# Patient Record
Sex: Male | Born: 1955 | Race: White | Hispanic: No | Smoking: Former smoker
Health system: Southern US, Community
[De-identification: ages and names within clinical notes are randomized; demographics above are authoritative.]

## PROBLEM LIST (undated history)

## (undated) DIAGNOSIS — I10 Essential (primary) hypertension: Secondary | ICD-10-CM

## (undated) DIAGNOSIS — N200 Calculus of kidney: Secondary | ICD-10-CM

## (undated) DIAGNOSIS — Z720 Tobacco use: Secondary | ICD-10-CM

## (undated) DIAGNOSIS — I519 Heart disease, unspecified: Secondary | ICD-10-CM

## (undated) DIAGNOSIS — I7100 Dissection of unspecified site of aorta: Secondary | ICD-10-CM

## (undated) HISTORY — DX: Heart disease, unspecified: I51.9

## (undated) HISTORY — PX: REPAIR OF ACUTE ASCENDING THORACIC AORTIC DISSECTION: SHX6323

## (undated) HISTORY — DX: Dissection of unspecified site of aorta: I71.00

## (undated) HISTORY — DX: Essential (primary) hypertension: I10

## (undated) HISTORY — DX: Calculus of kidney: N20.0

## (undated) HISTORY — DX: Tobacco use: Z72.0

---

## 2005-11-27 ENCOUNTER — Ambulatory Visit: Payer: Self-pay | Admitting: Family Medicine

## 2010-06-10 ENCOUNTER — Inpatient Hospital Stay (HOSPITAL_COMMUNITY): Admission: EM | Admit: 2010-06-10 | Discharge: 2010-06-15 | Payer: Self-pay | Admitting: Emergency Medicine

## 2010-06-10 ENCOUNTER — Ambulatory Visit: Payer: Self-pay | Admitting: Cardiology

## 2010-06-10 ENCOUNTER — Encounter: Payer: Self-pay | Admitting: Cardiothoracic Surgery

## 2010-06-10 ENCOUNTER — Ambulatory Visit: Payer: Self-pay | Admitting: Cardiothoracic Surgery

## 2010-06-10 DIAGNOSIS — I519 Heart disease, unspecified: Secondary | ICD-10-CM

## 2010-06-10 DIAGNOSIS — I7100 Dissection of unspecified site of aorta: Secondary | ICD-10-CM

## 2010-06-10 HISTORY — DX: Heart disease, unspecified: I51.9

## 2010-06-10 HISTORY — DX: Dissection of unspecified site of aorta: I71.00

## 2010-07-11 ENCOUNTER — Encounter: Admission: RE | Admit: 2010-07-11 | Discharge: 2010-07-11 | Payer: Self-pay | Admitting: Cardiothoracic Surgery

## 2010-07-11 ENCOUNTER — Ambulatory Visit: Payer: Self-pay | Admitting: Cardiothoracic Surgery

## 2010-07-11 ENCOUNTER — Encounter: Payer: Self-pay | Admitting: Cardiology

## 2010-07-16 DIAGNOSIS — I7100 Dissection of unspecified site of aorta: Secondary | ICD-10-CM

## 2010-07-16 DIAGNOSIS — F17200 Nicotine dependence, unspecified, uncomplicated: Secondary | ICD-10-CM | POA: Insufficient documentation

## 2010-07-16 DIAGNOSIS — I10 Essential (primary) hypertension: Secondary | ICD-10-CM | POA: Insufficient documentation

## 2010-07-17 ENCOUNTER — Encounter: Payer: Self-pay | Admitting: Cardiology

## 2010-07-17 ENCOUNTER — Ambulatory Visit: Payer: Self-pay | Admitting: Cardiology

## 2010-07-17 DIAGNOSIS — N259 Disorder resulting from impaired renal tubular function, unspecified: Secondary | ICD-10-CM | POA: Insufficient documentation

## 2010-07-22 ENCOUNTER — Encounter: Payer: Self-pay | Admitting: Cardiology

## 2010-07-24 ENCOUNTER — Encounter: Payer: Self-pay | Admitting: Cardiology

## 2010-07-25 ENCOUNTER — Encounter: Payer: Self-pay | Admitting: Cardiology

## 2010-08-01 ENCOUNTER — Telehealth: Payer: Self-pay | Admitting: Cardiology

## 2010-08-06 ENCOUNTER — Encounter: Payer: Self-pay | Admitting: Cardiology

## 2010-08-14 ENCOUNTER — Telehealth: Payer: Self-pay | Admitting: Cardiology

## 2010-08-29 ENCOUNTER — Encounter: Payer: Self-pay | Admitting: Cardiology

## 2010-09-03 ENCOUNTER — Encounter: Payer: Self-pay | Admitting: Cardiology

## 2010-09-10 NOTE — Miscellaneous (Signed)
Summary: Orders Update  Clinical Lists Changes  Medications: Rx of METOPROLOL TARTRATE 25 MG TABS (METOPROLOL TARTRATE) 1 by mouth two times a day;  #90 x 3;  Signed;  Entered by: Charolotte Capuchin, RN;  Authorized by: Rollene Rotunda, MD, [facility];  Method used: Print then Give to Patient Rx of SIMVASTATIN 20 MG TABS (SIMVASTATIN) 1 by mouth daily;  #90 x 3;  Signed;  Entered by: Charolotte Capuchin, RN;  Authorized by: Rollene Rotunda, MD, [facility];  Method used: Print then Give to Patient Orders: Added new Service order of EKG w/ Interpretation (93000) - Signed Observations: Added new observation of PI CARDIO: Your physician recommends that you schedule a follow-up appointment in: 4 months with Dr Antoine Poche in [place] Your physician recommends that you have lab work:  BMP with results faxed to [phone] Your physician recommends that you continue on your current medications as directed. Please refer to the Current Medication list given to you today. (07/17/2010 10:13)    Prescriptions: SIMVASTATIN 20 MG TABS (SIMVASTATIN) 1 by mouth daily  #90 x 3   Entered by:   Charolotte Capuchin, RN   Authorized by:   Rollene Rotunda, MD, [facility]   Signed by:   Charolotte Capuchin, RN on 07/17/2010   Method used:   Print then Give to Patient   RxID:   [id number] METOPROLOL TARTRATE 25 MG TABS (METOPROLOL TARTRATE) 1 by mouth two times a day  #90 x 3   Entered by:   Charolotte Capuchin, RN   Authorized by:   Rollene Rotunda, MD, [facility]   Signed by:   Charolotte Capuchin, RN on 07/17/2010   Method used:   Print then Give to Patient   RxID:   [id number]    Patient Instructions: 1)  Your physician recommends that you schedule a follow-up appointment in: 4 months with Dr Antoine Poche in [place] 2)  Your physician recommends that you have lab work:  BMP with results faxed to [phone] 3)  Your physician recommends that you continue on your current medications as directed. Please refer to the Current  Medication list given to you today.

## 2010-09-10 NOTE — Letter (Signed)
Summary: [facility] - Office Visit  [facility] - Office Visit   Imported By: Marylou Mccoy 07/19/2010 12:49:47  _____________________________________________________________________  External Attachment:    Type:   Image     Comment:   External Document

## 2010-09-10 NOTE — Assessment & Plan Note (Signed)
Summary: [facility]   Visit Type:  Follow-up Primary Provider:  None  CC:  Aortic Dissection.  History of Present Illness: The patient presents for evaluation after aortic dissection and repair. 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 He actually did quite well with this. Since going home he has had no complications and has seen the cardiothoracic surgeons and a followup chest x-ray. He has had no fevers or chills. He has had no chest pain, neck or arm discomfort. He has had no palpitations, presyncope or syncope. He has had no shortness of breath, PND or orthopnea.  Current Medications (verified): 1)  Metoprolol Tartrate 25 Mg Tabs (Metoprolol Tartrate) .Marland Kitchen.. 1 By Mouth Two Times A Day 2)  Aspirin 325 Mg  Tabs (Aspirin) .Marland Kitchen.. 1 By Mouth Daily 3)  Nu-Iron 150 Mg Caps (Polysaccharide Iron Complex) .Marland Kitchen.. 1 By Mouth Daily 4)  Oxycodone Hcl 5 Mg Tabs (Oxycodone Hcl) .... As Needed 5)  Simvastatin 20 Mg Tabs (Simvastatin) .Marland Kitchen.. 1 By Mouth Daily  Allergies (verified): 1)  ! Codeine  Past History:  Past Medical History: Reviewed history from 07/16/2010 and no changes required.  1. 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 Hypertension.   3. Remote tobacco abuse.   4. History of nephrolithiasis, status post stone removal.   5. Reduced left ventricular function by TEE intraoperatively, June 10, 2010 EF of 40-45% without regional wall motion abnormalities.      Past Surgical History: Reviewed history from 07/16/2010 and no changes required.  None.   Review of Systems       As stated in the HPI and negative for all other systems.   Vital Signs:  Patient profile:   55 year old male Height:      69 inches Weight:      178 pounds BMI:     26.38 Pulse rate:   86 / minute Resp:     16 per minute BP sitting:   128 / 82  (right arm)  Vitals  Entered By: Marrion Coy, CNA (July 17, 2010 9:30 AM)  Physical Exam  General:  Well developed, well nourished, in no acute distress. Head:  normocephalic and atraumatic Eyes:  PERRLA/EOM intact; conjunctiva and lids normal. Mouth:  Poor dentitionl. Oral mucosa normal. Neck:  Neck supple, no JVD. No masses, thyromegaly or abnormal cervical nodes. Chest Wall:  Well-healed sternotomy and small right subclavian scar Lungs:  Clear bilaterally to auscultation and percussion. Heart:  Non-displaced PMI, chest non-tender; regular rate and rhythm, S1, S2 without murmurs, rubs or gallops. Carotid upstroke normal, no bruit. Normal abdominal aortic size, no bruits. Femorals normal pulses, no bruits. Pedals normal pulses. No edema, no varicosities. Abdomen:  Bowel sounds positive; abdomen soft and non-tender without masses, organomegaly, or hernias noted. No hepatosplenomegaly. Msk:  Back normal, normal gait. Muscle strength and tone normal. Extremities:  No clubbing or cyanosis. Neurologic:  Alert and oriented x 3. Skin:  Intact without lesions or rashes. Cervical Nodes:  no significant adenopathy Inguinal Nodes:  no significant adenopathy Psych:  Normal affect.   EKG  Procedure date:  07/17/2010  Findings:      Normal sinus rhythm, rate 86, axis within normal limits, interval of the normal limits, poor anterior R wave progression.  Impression & Recommendations:  Problem # 1:  AORTIC DISSECTION (ICD-441.00) He is doing well following this. No change in therapy is indicated.  Problem # 2:  TOBACCO ABUSE (ICD-305.1) He remains on all tobacco and I applauded this and encouraged continued cessation.  Problem # 3:  HYPERTENSION (ICD-401.9) His blood pressure is controlled and he will continue current meds.  Problem # 4:  RENAL INSUFFICIENCY (ICD-588.9) He had some mild renal insufficiency with his creatinine slightly higher at discharge. I will follow his basic metabolic profile.

## 2010-09-12 NOTE — Progress Notes (Signed)
Summary: michelle w/[facility] re meds  RX Toprol XL 50  Phone Note From Other Clinic   Caller: [facility] michelle Summary of Call: michelle calling re clarification of med-pls call [phone] Initial call taken by: Glynda Jaeger,  August 01, 2010 2:20 PM  Follow-up for Phone Call        Toprol XL 50 mg once a day verbal order given again, OK to fill for 1 year Follow-up by: Charolotte Capuchin, RN,  August 01, 2010 3:41 PM

## 2010-09-12 NOTE — Letter (Signed)
Summary: [employer] Employment Production assistant, radio Employment Application   Imported By: Cala Bradford Mesiemore 08/07/2010 10:17:40  _____________________________________________________________________  External Attachment:    Type:   Image     Comment:   External Document  Appended Document: [employer] Employment Application Mailed [employer] papers out to [address],[place] [address]  Appended Document: [employer] Employment Application Correction Mailed to Ace Gins, CPAP Coordinator            [facility]            [place] [address]

## 2010-09-12 NOTE — Progress Notes (Signed)
Summary: b/p issues.   Phone Note Call from Patient Call back at Home Phone [phone]   Caller: Patient Reason for Call: Talk to Nurse Summary of Call: Nicholas Palmer call dr. Lowella Fairy this am was told by him that he would contacted dr. Antoine Poche regarding his b/p issues. Nicholas Palmer aware that dr. Antoine Poche is in the office today having clinic. c/o b/p on last night 147/101 pulse 121 @ 7:30`8:30p.m. Marland Kitchen 153/102 pulse 127 @ 9:00 p.m. Nicholas Palmer took b/p before taking any meds. this was @ 6 am. right arm 162/107 pulse 102  left arm 149/99 pulse 102. took meds.   @ 8a.m. 152/96 pulse 83. @ 10a.m 149/89 pulse 84.   Nicholas Palmer went to [facility] to take his b/p by machine 146/86 pulse 105.  Initial call taken by: Lorne Skeens,  August 14, 2010 3:58 PM  Follow-up for Phone Call        B/P has been elevated for some time now.  Worse in the PM. Nicholas Palmer has been doubling metoprolol 25 mg two times a day for 2 weeks or more and its not getting better. (total of 100mg  a day) Nicholas Palmer aware I will review with MD and call him back with changes. Follow-up by: Charolotte Capuchin, RN,  August 14, 2010 4:16 PM  Additional Follow-up for Phone Call Additional follow up Details #1::        Continue with the higher dose of metoprolol and add Lisinopril 10 mg daily.  Check BMET in two weeks. Additional Follow-up by: Rollene Rotunda, MD, [facility],  August 14, 2010 5:52 PM    Additional Follow-up for Phone Call Additional follow up Details #2::    Nicholas Palmer aware of orders and need to have labs drawn in 2 weeks.   Follow-up by: Charolotte Capuchin, RN,  August 14, 2010 6:08 PM  New/Updated Medications: METOPROLOL TARTRATE 50 MG TABS (METOPROLOL TARTRATE) one twice a day LISINOPRIL 10 MG TABS (LISINOPRIL) one a day Prescriptions: LISINOPRIL 10 MG TABS (LISINOPRIL) one a day  #90 x 3   Entered by:   Charolotte Capuchin, RN   Authorized by:   Rollene Rotunda, MD, [facility]   Signed by:   Charolotte Capuchin, RN on 08/14/2010   Method used:   Electronically to   [facility]  [address] (retail)       [address]       [place], [place]  [address]       Ph: [phone]       Fax: [phone]   RxID:   [id number] METOPROLOL TARTRATE 50 MG TABS (METOPROLOL TARTRATE) one twice a day  #180 x 3   Entered by:   Charolotte Capuchin, RN   Authorized by:   Rollene Rotunda, MD, [facility]   Signed by:   Charolotte Capuchin, RN on 08/14/2010   Method used:   Electronically to        [facility]  [address] (retail)       [address]       [place], [place]  [address]       Ph: [phone]       Fax: [phone]   RxID:   [id number]

## 2010-09-12 NOTE — Medication Information (Signed)
Summary: Toprol  Toprol   Imported By: Marylou Mccoy 08/13/2010 15:23:40  _____________________________________________________________________  External Attachment:    Type:   Image     Comment:   External Document

## 2010-09-12 NOTE — Miscellaneous (Signed)
Summary: metoprolol tart changed to succ.  received faxed from [facility], pt gets medication asst. through their program adn due to cost they need to change his metoprolol tartrate to toprol, ok per Dr Antoine Poche form completed for toprol 50mg  daily, signed by Dr Antoine Poche and faxed to them at [phone] Meredith Staggers, RN  July 25, 2010 8:34 AM  Clinical Lists Changes  Medications: Changed medication from METOPROLOL TARTRATE 25 MG TABS (METOPROLOL TARTRATE) 1 by mouth two times a day to TOPROL XL 50 MG XR24H-TAB (METOPROLOL SUCCINATE) Take 1 tablet by mouth once a day

## 2010-10-22 LAB — BASIC METABOLIC PANEL
BUN: 24 mg/dL — ABNORMAL HIGH (ref 6–23)
BUN: 28 mg/dL — ABNORMAL HIGH (ref 6–23)
BUN: 28 mg/dL — ABNORMAL HIGH (ref 6–23)
BUN: 28 mg/dL — ABNORMAL HIGH (ref 6–23)
BUN: 29 mg/dL — ABNORMAL HIGH (ref 6–23)
BUN: 29 mg/dL — ABNORMAL HIGH (ref 6–23)
CO2: 25 mEq/L (ref 19–32)
CO2: 26 mEq/L (ref 19–32)
CO2: 27 mEq/L (ref 19–32)
CO2: 28 mEq/L (ref 19–32)
CO2: 28 mEq/L (ref 19–32)
CO2: 28 mEq/L (ref 19–32)
Calcium: 7.4 mg/dL — ABNORMAL LOW (ref 8.4–10.5)
Calcium: 7.6 mg/dL — ABNORMAL LOW (ref 8.4–10.5)
Calcium: 7.8 mg/dL — ABNORMAL LOW (ref 8.4–10.5)
Calcium: 7.8 mg/dL — ABNORMAL LOW (ref 8.4–10.5)
Calcium: 8.1 mg/dL — ABNORMAL LOW (ref 8.4–10.5)
Calcium: 8.3 mg/dL — ABNORMAL LOW (ref 8.4–10.5)
Chloride: 106 mEq/L (ref 96–112)
Chloride: 106 mEq/L (ref 96–112)
Chloride: 107 mEq/L (ref 96–112)
Chloride: 107 mEq/L (ref 96–112)
Chloride: 108 mEq/L (ref 96–112)
Chloride: 112 mEq/L (ref 96–112)
Creatinine, Ser: 1.47 mg/dL (ref 0.4–1.5)
Creatinine, Ser: 1.54 mg/dL — ABNORMAL HIGH (ref 0.4–1.5)
Creatinine, Ser: 1.59 mg/dL — ABNORMAL HIGH (ref 0.4–1.5)
Creatinine, Ser: 1.59 mg/dL — ABNORMAL HIGH (ref 0.4–1.5)
Creatinine, Ser: 1.6 mg/dL — ABNORMAL HIGH (ref 0.4–1.5)
Creatinine, Ser: 1.85 mg/dL — ABNORMAL HIGH (ref 0.4–1.5)
GFR calc Af Amer: 46 mL/min — ABNORMAL LOW (ref >60)
GFR calc Af Amer: 55 mL/min — ABNORMAL LOW (ref >60)
GFR calc Af Amer: 55 mL/min — ABNORMAL LOW (ref >60)
GFR calc Af Amer: 55 mL/min — ABNORMAL LOW (ref >60)
GFR calc Af Amer: 57 mL/min — ABNORMAL LOW (ref >60)
GFR calc Af Amer: >60 mL/min (ref >60)
GFR calc non Af Amer: 38 mL/min — ABNORMAL LOW (ref >60)
GFR calc non Af Amer: 45 mL/min — ABNORMAL LOW (ref >60)
GFR calc non Af Amer: 45 mL/min — ABNORMAL LOW (ref >60)
GFR calc non Af Amer: 45 mL/min — ABNORMAL LOW (ref >60)
GFR calc non Af Amer: 47 mL/min — ABNORMAL LOW (ref >60)
GFR calc non Af Amer: 50 mL/min — ABNORMAL LOW (ref >60)
Glucose, Bld: 114 mg/dL — ABNORMAL HIGH (ref 70–99)
Glucose, Bld: 120 mg/dL — ABNORMAL HIGH (ref 70–99)
Glucose, Bld: 127 mg/dL — ABNORMAL HIGH (ref 70–99)
Glucose, Bld: 140 mg/dL — ABNORMAL HIGH (ref 70–99)
Glucose, Bld: 154 mg/dL — ABNORMAL HIGH (ref 70–99)
Glucose, Bld: 77 mg/dL (ref 70–99)
Potassium: 3.9 mEq/L (ref 3.5–5.1)
Potassium: 4.2 mEq/L (ref 3.5–5.1)
Potassium: 4.3 mEq/L (ref 3.5–5.1)
Potassium: 4.4 mEq/L (ref 3.5–5.1)
Potassium: 4.4 mEq/L (ref 3.5–5.1)
Potassium: 4.6 mEq/L (ref 3.5–5.1)
Sodium: 138 mEq/L (ref 135–145)
Sodium: 138 mEq/L (ref 135–145)
Sodium: 139 mEq/L (ref 135–145)
Sodium: 139 mEq/L (ref 135–145)
Sodium: 140 mEq/L (ref 135–145)
Sodium: 143 mEq/L (ref 135–145)

## 2010-10-22 LAB — MAGNESIUM: Magnesium: 2.3 mg/dL (ref 1.5–2.5)

## 2010-10-22 LAB — CBC
HCT: 23.5 % — ABNORMAL LOW (ref 39.0–52.0)
HCT: 24 % — ABNORMAL LOW (ref 39.0–52.0)
HCT: 25 % — ABNORMAL LOW (ref 39.0–52.0)
HCT: 25.4 % — ABNORMAL LOW (ref 39.0–52.0)
HCT: 27 % — ABNORMAL LOW (ref 39.0–52.0)
HCT: 28.6 % — ABNORMAL LOW (ref 39.0–52.0)
Hemoglobin: 7.7 g/dL — ABNORMAL LOW (ref 13.0–17.0)
Hemoglobin: 7.9 g/dL — ABNORMAL LOW (ref 13.0–17.0)
Hemoglobin: 8.3 g/dL — ABNORMAL LOW (ref 13.0–17.0)
Hemoglobin: 8.7 g/dL — ABNORMAL LOW (ref 13.0–17.0)
Hemoglobin: 9.1 g/dL — ABNORMAL LOW (ref 13.0–17.0)
Hemoglobin: 9.3 g/dL — ABNORMAL LOW (ref 13.0–17.0)
MCH: 30.3 pg (ref 26.0–34.0)
MCH: 30.3 pg (ref 26.0–34.0)
MCH: 30.5 pg (ref 26.0–34.0)
MCH: 30.6 pg (ref 26.0–34.0)
MCH: 30.7 pg (ref 26.0–34.0)
MCH: 30.8 pg (ref 26.0–34.0)
MCHC: 32.5 g/dL (ref 30.0–36.0)
MCHC: 32.8 g/dL (ref 30.0–36.0)
MCHC: 32.9 g/dL (ref 30.0–36.0)
MCHC: 33.2 g/dL (ref 30.0–36.0)
MCHC: 33.7 g/dL (ref 30.0–36.0)
MCHC: 34.3 g/dL (ref 30.0–36.0)
MCV: 89.4 fL (ref 78.0–100.0)
MCV: 91.5 fL (ref 78.0–100.0)
MCV: 91.9 fL (ref 78.0–100.0)
MCV: 92.5 fL (ref 78.0–100.0)
MCV: 93.2 fL (ref 78.0–100.0)
MCV: 93.4 fL (ref 78.0–100.0)
Platelets: 131 10*3/uL — ABNORMAL LOW (ref 150–400)
Platelets: 148 10*3/uL — ABNORMAL LOW (ref 150–400)
Platelets: 87 10*3/uL — ABNORMAL LOW (ref 150–400)
Platelets: 89 10*3/uL — ABNORMAL LOW (ref 150–400)
Platelets: 90 10*3/uL — ABNORMAL LOW (ref 150–400)
Platelets: 90 10*3/uL — ABNORMAL LOW (ref 150–400)
RBC: 2.54 MIL/uL — ABNORMAL LOW (ref 4.22–5.81)
RBC: 2.57 MIL/uL — ABNORMAL LOW (ref 4.22–5.81)
RBC: 2.72 MIL/uL — ABNORMAL LOW (ref 4.22–5.81)
RBC: 2.84 MIL/uL — ABNORMAL LOW (ref 4.22–5.81)
RBC: 2.95 MIL/uL — ABNORMAL LOW (ref 4.22–5.81)
RBC: 3.07 MIL/uL — ABNORMAL LOW (ref 4.22–5.81)
RDW: 14.1 % (ref 11.5–15.5)
RDW: 14.1 % (ref 11.5–15.5)
RDW: 14.1 % (ref 11.5–15.5)
RDW: 14.4 % (ref 11.5–15.5)
RDW: 14.5 % (ref 11.5–15.5)
RDW: 14.6 % (ref 11.5–15.5)
WBC: 12.7 10*3/uL — ABNORMAL HIGH (ref 4.0–10.5)
WBC: 13.3 10*3/uL — ABNORMAL HIGH (ref 4.0–10.5)
WBC: 16.4 10*3/uL — ABNORMAL HIGH (ref 4.0–10.5)
WBC: 20.9 10*3/uL — ABNORMAL HIGH (ref 4.0–10.5)
WBC: 21.1 10*3/uL — ABNORMAL HIGH (ref 4.0–10.5)
WBC: 22.9 10*3/uL — ABNORMAL HIGH (ref 4.0–10.5)

## 2010-10-22 LAB — GLUCOSE, CAPILLARY
Glucose-Capillary: 106 mg/dL — ABNORMAL HIGH (ref 70–99)
Glucose-Capillary: 106 mg/dL — ABNORMAL HIGH (ref 70–99)
Glucose-Capillary: 111 mg/dL — ABNORMAL HIGH (ref 70–99)
Glucose-Capillary: 111 mg/dL — ABNORMAL HIGH (ref 70–99)
Glucose-Capillary: 136 mg/dL — ABNORMAL HIGH (ref 70–99)
Glucose-Capillary: 143 mg/dL — ABNORMAL HIGH (ref 70–99)
Glucose-Capillary: 147 mg/dL — ABNORMAL HIGH (ref 70–99)
Glucose-Capillary: 154 mg/dL — ABNORMAL HIGH (ref 70–99)
Glucose-Capillary: 155 mg/dL — ABNORMAL HIGH (ref 70–99)
Glucose-Capillary: 155 mg/dL — ABNORMAL HIGH (ref 70–99)

## 2010-10-22 LAB — POCT I-STAT, CHEM 8
Creatinine, Ser: 1.7 mg/dL — ABNORMAL HIGH (ref 0.4–1.5)
HCT: 27 % — ABNORMAL LOW (ref 39.0–52.0)
Hemoglobin: 9.2 g/dL — ABNORMAL LOW (ref 13.0–17.0)
Potassium: 4.4 mEq/L (ref 3.5–5.1)
Sodium: 141 mEq/L (ref 135–145)

## 2010-10-22 LAB — LIPID PANEL
Cholesterol: 88 mg/dL (ref 0–200)
HDL: 26 mg/dL — ABNORMAL LOW (ref >39)
LDL Cholesterol: 44 mg/dL (ref 0–99)
Total CHOL/HDL Ratio: 3.4 RATIO
Triglycerides: 91 mg/dL (ref <150)
VLDL: 18 mg/dL (ref 0–40)

## 2010-10-22 NOTE — Medication Information (Signed)
Summary: [facility] Prescripsion Request   [facility] Prescripsion Request   Imported By: Roderic Ovens 10/18/2010 11:15:23  _____________________________________________________________________  External Attachment:    Type:   Image     Comment:   External Document

## 2010-10-23 LAB — POCT I-STAT 3, ART BLOOD GAS (G3+)
Acid-base deficit: 3 mmol/L — ABNORMAL HIGH (ref 0.0–2.0)
Acid-base deficit: 4 mmol/L — ABNORMAL HIGH (ref 0.0–2.0)
Bicarbonate: 21.6 mEq/L (ref 20.0–24.0)
Bicarbonate: 22.3 mEq/L (ref 20.0–24.0)
Bicarbonate: 23.8 mEq/L (ref 20.0–24.0)
Bicarbonate: 26.8 mEq/L — ABNORMAL HIGH (ref 20.0–24.0)
O2 Saturation: 100 %
O2 Saturation: 100 %
O2 Saturation: 100 %
O2 Saturation: 97 %
Patient temperature: 36
TCO2: 19 mmol/L (ref 0–100)
TCO2: 21 mmol/L (ref 0–100)
TCO2: 24 mmol/L (ref 0–100)
TCO2: 25 mmol/L (ref 0–100)
TCO2: 26 mmol/L (ref 0–100)
pCO2 arterial: 39.7 mmHg (ref 35.0–45.0)
pCO2 arterial: 40.1 mmHg (ref 35.0–45.0)
pCO2 arterial: 46.2 mmHg — ABNORMAL HIGH (ref 35.0–45.0)
pH, Arterial: 7.233 — ABNORMAL LOW (ref 7.350–7.450)
pH, Arterial: 7.433 (ref 7.350–7.450)
pO2, Arterial: 284 mmHg — ABNORMAL HIGH (ref 80.0–100.0)
pO2, Arterial: 407 mmHg — ABNORMAL HIGH (ref 80.0–100.0)
pO2, Arterial: 97 mmHg (ref 80.0–100.0)

## 2010-10-23 LAB — TYPE AND SCREEN
ABO/RH(D): A POS
Unit division: 0
Unit division: 0
Unit division: 0
Unit division: 0
Unit division: 0

## 2010-10-23 LAB — CBC
HCT: 27.3 % — ABNORMAL LOW (ref 39.0–52.0)
HCT: 27.4 % — ABNORMAL LOW (ref 39.0–52.0)
Hemoglobin: 9.3 g/dL — ABNORMAL LOW (ref 13.0–17.0)
Hemoglobin: 9.4 g/dL — ABNORMAL LOW (ref 13.0–17.0)
MCH: 29.6 pg (ref 26.0–34.0)
MCH: 30.3 pg (ref 26.0–34.0)
MCH: 30.4 pg (ref 26.0–34.0)
MCHC: 34.1 g/dL (ref 30.0–36.0)
MCHC: 34.3 g/dL (ref 30.0–36.0)
MCV: 88.1 fL (ref 78.0–100.0)
MCV: 88.7 fL (ref 78.0–100.0)
MCV: 88.9 fL (ref 78.0–100.0)
Platelets: 159 10*3/uL (ref 150–400)
Platelets: 68 10*3/uL — ABNORMAL LOW (ref 150–400)
Platelets: 80 10*3/uL — ABNORMAL LOW (ref 150–400)
RBC: 2.27 MIL/uL — ABNORMAL LOW (ref 4.22–5.81)
RBC: 3.07 MIL/uL — ABNORMAL LOW (ref 4.22–5.81)
RBC: 3.09 MIL/uL — ABNORMAL LOW (ref 4.22–5.81)
RBC: 4.32 MIL/uL (ref 4.22–5.81)
RDW: 13.5 % (ref 11.5–15.5)
RDW: 13.7 % (ref 11.5–15.5)
WBC: 13.6 10*3/uL — ABNORMAL HIGH (ref 4.0–10.5)
WBC: 14.3 10*3/uL — ABNORMAL HIGH (ref 4.0–10.5)
WBC: 9.8 10*3/uL (ref 4.0–10.5)
WBC: 9.9 10*3/uL (ref 4.0–10.5)

## 2010-10-23 LAB — MAGNESIUM: Magnesium: 2.5 mg/dL (ref 1.5–2.5)

## 2010-10-23 LAB — POCT I-STAT, CHEM 8
BUN: 22 mg/dL (ref 6–23)
Calcium, Ion: 0.99 mmol/L — ABNORMAL LOW (ref 1.12–1.32)
Calcium, Ion: 1.04 mmol/L — ABNORMAL LOW (ref 1.12–1.32)
Chloride: 108 mEq/L (ref 96–112)
Glucose, Bld: 161 mg/dL — ABNORMAL HIGH (ref 70–99)
Glucose, Bld: 201 mg/dL — ABNORMAL HIGH (ref 70–99)
HCT: 39 % (ref 39.0–52.0)
Hemoglobin: 13.3 g/dL (ref 13.0–17.0)
Potassium: 3.3 mEq/L — ABNORMAL LOW (ref 3.5–5.1)

## 2010-10-23 LAB — POCT I-STAT 4, (NA,K, GLUC, HGB,HCT)
Glucose, Bld: 133 mg/dL — ABNORMAL HIGH (ref 70–99)
Glucose, Bld: 175 mg/dL — ABNORMAL HIGH (ref 70–99)
Glucose, Bld: 179 mg/dL — ABNORMAL HIGH (ref 70–99)
Glucose, Bld: 202 mg/dL — ABNORMAL HIGH (ref 70–99)
HCT: 20 % — ABNORMAL LOW (ref 39.0–52.0)
HCT: 31 % — ABNORMAL LOW (ref 39.0–52.0)
HCT: 31 % — ABNORMAL LOW (ref 39.0–52.0)
Hemoglobin: 10.5 g/dL — ABNORMAL LOW (ref 13.0–17.0)
Hemoglobin: 10.5 g/dL — ABNORMAL LOW (ref 13.0–17.0)
Hemoglobin: 8.8 g/dL — ABNORMAL LOW (ref 13.0–17.0)
Hemoglobin: 9.2 g/dL — ABNORMAL LOW (ref 13.0–17.0)
Potassium: 3.6 mEq/L (ref 3.5–5.1)
Potassium: 4.4 mEq/L (ref 3.5–5.1)
Sodium: 145 mEq/L (ref 135–145)
Sodium: 145 mEq/L (ref 135–145)
Sodium: 145 mEq/L (ref 135–145)

## 2010-10-23 LAB — CK TOTAL AND CKMB (NOT AT ARMC)
CK, MB: 3.1 ng/mL (ref 0.3–4.0)
Relative Index: 2.4 (ref 0.0–2.5)
Total CK: 128 U/L (ref 7–232)

## 2010-10-23 LAB — CREATININE, SERUM
Creatinine, Ser: 1.68 mg/dL — ABNORMAL HIGH (ref 0.4–1.5)
GFR calc Af Amer: 52 mL/min — ABNORMAL LOW (ref >60)
GFR calc non Af Amer: 43 mL/min — ABNORMAL LOW (ref >60)

## 2010-10-23 LAB — PROTIME-INR
INR: 0.82 (ref 0.00–1.49)
INR: 1.14 (ref 0.00–1.49)
Prothrombin Time: 11.5 seconds — ABNORMAL LOW (ref 11.6–15.2)
Prothrombin Time: 14.8 seconds (ref 11.6–15.2)

## 2010-10-23 LAB — TROPONIN I: Troponin I: 0.01 ng/mL (ref 0.00–0.06)

## 2010-10-23 LAB — BRAIN NATRIURETIC PEPTIDE: Pro B Natriuretic peptide (BNP): 89 pg/mL (ref 0.0–100.0)

## 2010-10-23 LAB — COMPREHENSIVE METABOLIC PANEL
AST: 18 U/L (ref 0–37)
CO2: 23 mEq/L (ref 19–32)
Calcium: 8.4 mg/dL (ref 8.4–10.5)
Creatinine, Ser: 2.06 mg/dL — ABNORMAL HIGH (ref 0.4–1.5)
GFR calc Af Amer: 41 mL/min — ABNORMAL LOW (ref >60)
GFR calc non Af Amer: 34 mL/min — ABNORMAL LOW (ref >60)
Total Protein: 5.5 g/dL — ABNORMAL LOW (ref 6.0–8.3)

## 2010-10-23 LAB — PREPARE PLATELETS
Unit division: 0
Unit division: 0

## 2010-10-23 LAB — HEMOCCULT GUIAC POC 1CARD (OFFICE): Fecal Occult Bld: NEGATIVE

## 2010-10-23 LAB — POCT I-STAT GLUCOSE
Glucose, Bld: 150 mg/dL — ABNORMAL HIGH (ref 70–99)
Glucose, Bld: 151 mg/dL — ABNORMAL HIGH (ref 70–99)
Glucose, Bld: 190 mg/dL — ABNORMAL HIGH (ref 70–99)
Operator id: 3408
Operator id: 3408

## 2010-10-23 LAB — PREPARE FRESH FROZEN PLASMA: Unit division: 0

## 2010-10-23 LAB — GLUCOSE, CAPILLARY
Glucose-Capillary: 168 mg/dL — ABNORMAL HIGH (ref 70–99)
Glucose-Capillary: 99 mg/dL (ref 70–99)

## 2010-10-23 LAB — PREPARE RBC (CROSSMATCH)

## 2010-10-23 LAB — DIFFERENTIAL
Basophils Absolute: 0 10*3/uL (ref 0.0–0.1)
Basophils Relative: 0 % (ref 0–1)
Eosinophils Absolute: 0 10*3/uL (ref 0.0–0.7)
Eosinophils Relative: 1 % (ref 0–5)
Lymphocytes Relative: 23 % (ref 12–46)
Lymphs Abs: 0.4 10*3/uL — ABNORMAL LOW (ref 0.7–4.0)
Lymphs Abs: 3.2 10*3/uL (ref 0.7–4.0)
Monocytes Absolute: 0.6 10*3/uL (ref 0.1–1.0)
Neutro Abs: 8.8 10*3/uL — ABNORMAL HIGH (ref 1.7–7.7)

## 2010-10-23 LAB — PREPARE CRYOPRECIPITATE: Unit division: 0

## 2010-10-23 LAB — POCT CARDIAC MARKERS
CKMB, poc: <1 ng/mL — ABNORMAL LOW (ref 1.0–8.0)
Myoglobin, poc: 119 ng/mL (ref 12–200)
Troponin i, poc: <0.05 ng/mL (ref 0.00–0.09)
Troponin i, poc: <0.05 ng/mL (ref 0.00–0.09)

## 2010-10-23 LAB — ABO/RH: ABO/RH(D): A POS

## 2010-10-23 LAB — APTT
aPTT: 32 seconds (ref 24–37)
aPTT: 34 seconds (ref 24–37)

## 2010-10-23 LAB — PLATELET COUNT: Platelets: 71 10*3/uL — ABNORMAL LOW (ref 150–400)

## 2010-11-15 ENCOUNTER — Other Ambulatory Visit: Payer: Self-pay | Admitting: Cardiothoracic Surgery

## 2010-12-05 DIAGNOSIS — Z0271 Encounter for disability determination: Secondary | ICD-10-CM

## 2010-12-12 ENCOUNTER — Ambulatory Visit
Admission: RE | Admit: 2010-12-12 | Discharge: 2010-12-12 | Disposition: A | Discharge status: Home / Self Care | Payer: No Typology Code available for payment source | Source: Ambulatory Visit | Attending: Cardiothoracic Surgery | Admitting: Cardiothoracic Surgery

## 2010-12-12 ENCOUNTER — Ambulatory Visit (INDEPENDENT_AMBULATORY_CARE_PROVIDER_SITE_OTHER): Payer: Self-pay | Admitting: Cardiothoracic Surgery

## 2010-12-12 DIAGNOSIS — I7101 Dissection of thoracic aorta: Secondary | ICD-10-CM

## 2010-12-12 MED ORDER — IOHEXOL 350 MG/ML SOLN
100.0000 mL | Freq: Once | INTRAVENOUS | Status: AC | PRN
  Administered 2010-12-12: 100 mL via INTRAVENOUS

## 2010-12-13 NOTE — Assessment & Plan Note (Signed)
OFFICE VISIT  Nicholas Palmer, Nicholas Palmer DOB:  21-Jan-1956                                        Dec 12, 2010 CHART #:  [id number]  The patient returns to the office today in followup after his acute aortic dissection ascending repaired with replacement of the ascending aorta with circulatory arrest on June 10, 2010.  He is now approximately 6 months after his repair.  He does complain of some soreness over the right upper chest were from his arterial cannulation. He notes that he had been unable to return to work and was let go from the [employer] he had been working at.  He is currently seeking disability.  He has had no chest pain or evidence of congestive heart failure.  On exam today, his initial blood pressure is 152/89, on repeat in the right upper arm it was 135/87 and the left upper lung 138/86, pulse with 86, respiratory rate is 20, O2 sats 99%.  His sternum is stable and well healed.  Femoral arterial cannulation site is also well healed.  He has bowel sounds that are crisp, I do not appreciate any murmur of aortic insufficiency.  Abdominal exam is benign.  He has no calf tenderness.  He continues on: 1. Aspirin 325 a day. 2. Lopressor 50 twice a day. 3. Zocor 20 a day. 4. Lisinopril 10 mg a day.  Follow up CT scan was done that shows normal repair of the ascending aorta.  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 There appears no evidence of dilatation of any of these vessels. Obviously, he will continue to need close followup of his aortic status in the future, otherwise, I will plan to see him back in 1 year with a followup CTA of his chest.  He noted that he was to see Dr. Antoine Poche, but was unsure when his appointment was.  He is checking with Dr. Jenene Slicker office for his appointment and followup in [place] in the near future.  Obviously, over  the long run, he will need to continue to be treated aggressively for blood pressure control, ideally with beta- blockers involved.  The patient does not smoke and has been taking his blood pressure at home and appears to be motivated to continue with good health habits.  Sheliah Plane, MD Electronically Signed  EG/MEDQ  D:  12/12/2010  T:  12/13/2010  Job:  [id number]  cc:   Rollene Rotunda, MD, [facility]

## 2010-12-24 NOTE — Assessment & Plan Note (Signed)
OFFICE VISIT   DIERKS, WACH  DOB:  1956-01-19                                        July 11, 2010  CHART #:  [id number]   [name] returns to the office today in followup after his acute  presentation with type 1 aortic dissection on 10 and underwent emergency  repair on June 10, 2010.  He had a reasonable postoperative course  without any significant problems neurologically, was discharged home in  good condition.  He had replacement of his ascending aorta and  resuspension of aortic valve with circulatory arrest.  Since being  discharged home he appears to be increasing his physical activity  appropriately.  He has had no symptoms of congestive heart failure.  No  recurrent chest pain.   On exam, his blood pressure is 129/76, pulse 106, respiratory rate 18,  and O2 sats 98%.  Blood pressure was in his right arm, he has no carotid  bruits.  His sternum is stable and well healed.  Bowel sounds are crisp.  I do not appreciate any murmur of aortic insufficiency.  He has equal  brachial and radial and femoral pulses, and distal pedal pulses.  He has  no pedal edema.   Followup chest x-ray shows clear lung fields without effusions  bilaterally.   He continues on aspirin 325 mg a day, Lopressor 25 b.i.d., oxycodone  p.r.n., and Zocor 20 mg a day.  Overall I am pleased with his progress.  He has a followup appointment with Dr. Antoine Poche, in the early December.  I will plan to see him back in 6 months with a followup CTA of the chest  and abdomen to evaluate the status of his dissection.  The patient does  have issues with work, he works at a [employer] and does significant  amount of heavy lifting.  I have told him that he can do no lifting over  20 pounds after 6-8 weeks off of work but no lifting over that for at  least 3  months.  He is going to check with his work and see if there are other  things he can do that do not involve as much  lifting.   Sheliah Plane, MD  Electronically Signed   EG/MEDQ  D:  07/11/2010  T:  07/12/2010  Job:  [id number]   cc:   Rollene Rotunda, MD, [facility]

## 2011-01-07 ENCOUNTER — Encounter: Payer: Self-pay | Admitting: Cardiology

## 2011-01-08 ENCOUNTER — Encounter: Payer: Self-pay | Admitting: Cardiology

## 2011-01-08 ENCOUNTER — Ambulatory Visit (INDEPENDENT_AMBULATORY_CARE_PROVIDER_SITE_OTHER): Payer: Medicaid Other | Admitting: Cardiology

## 2011-01-08 DIAGNOSIS — N259 Disorder resulting from impaired renal tubular function, unspecified: Secondary | ICD-10-CM

## 2011-01-08 DIAGNOSIS — F17200 Nicotine dependence, unspecified, uncomplicated: Secondary | ICD-10-CM

## 2011-01-08 DIAGNOSIS — I10 Essential (primary) hypertension: Secondary | ICD-10-CM

## 2011-01-08 DIAGNOSIS — I7100 Dissection of unspecified site of aorta: Secondary | ICD-10-CM

## 2011-01-08 MED ORDER — METOPROLOL TARTRATE 50 MG PO TABS: ORAL_TABLET | ORAL | Status: DC

## 2011-01-08 NOTE — Progress Notes (Signed)
HPI The patient presents for followup of hypertension and his aortic dissection. He recently saw his surgeon and I reviewed a CT scan done. It demonstrates a stable thoracic repair with continued true and false lumens filling in the descending aorta thoracic aorta. His blood pressure diary reports that it has been well-controlled. He has had no chest pressure, neck or arm discomfort. He has had no palpitations, syncope or syncope. He has had no weight gain or edema. He is tolerating the medications as listed. He does some walking most days without limitation.  Allergies  Allergen Reactions  . Codeine     Current Outpatient Prescriptions  Medication Sig Dispense Refill  . aspirin 325 MG tablet Take 325 mg by mouth daily.        Marland Kitchen lisinopril (PRINIVIL,ZESTRIL) 10 MG tablet Take 10 mg by mouth daily.        . metoprolol (LOPRESSOR) 50 MG tablet Take 50 mg by mouth 2 (two) times daily.        . simvastatin (ZOCOR) 20 MG tablet Take 20 mg by mouth daily.        Marland Kitchen DISCONTD: iron polysaccharides (NU-IRON) 150 MG capsule Take 150 mg by mouth daily.        Marland Kitchen DISCONTD: oxyCODONE (OXY IR/ROXICODONE) 5 MG immediate release tablet Take 5 mg by mouth as needed.          Past Medical History  Diagnosis Date  . 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 Hypertension   . Tobacco abuse   . Nephrolithiasis     has history; status post stone removal  . Decreased left ventricular function June 10, 2010    EF of 40-45% w/ out regional wall motion abnormalities    ROS:  As stated in the HPI and negative for all other systems.  PHYSICAL EXAM BP 134/78  Pulse 91  Resp 16  Ht 5\' 9"  (1.753 m)  Wt 198 lb (89.812 kg)  BMI 29.24 kg/m2 GENERAL:  Well appearing HEENT:  Pupils equal round and reactive, fundi not visualized, oral mucosa unremarkable NECK:  No jugular venous distention, waveform within normal limits, carotid  upstroke brisk and symmetric, no bruits, no thyromegaly LYMPHATICS:  No cervical, inguinal adenopathy LUNGS:  Clear to auscultation bilaterally BACK:  No CVA tenderness CHEST:  Well healed sternotomy scar and right upper chest pain. HEART:  PMI not displaced or sustained,S1 and S2 within normal limits, no S3, no S4, no clicks, no rubs, no murmurs ABD:  Flat, positive bowel sounds normal in frequency in pitch, no bruits, no rebound, no guarding, no midline pulsatile mass, no hepatomegaly, no splenomegaly EXT:  2 plus pulses throughout, no edema, no cyanosis no clubbing SKIN:  No rashes no nodules NEURO:  Cranial nerves II through XII grossly intact, motor grossly intact throughout PSYCH:  Cognitively intact, oriented to person place and time  EKG:  Sinus rhythm, rate 91, axis within normal limits, intervals within normal limits, no acute ST-T wave changes  ASSESSMENT AND PLAN

## 2011-01-08 NOTE — Assessment & Plan Note (Signed)
I will continue to try to control his blood pressure and heart rate aggressively. Toward that end I will increase his metoprolol to 75 mg twice daily.

## 2011-01-08 NOTE — Patient Instructions (Signed)
Increase metoprolol to 75 mg twice a day Continue all other medications as listed See Dr Antoine Poche back in 6 months in [place]

## 2011-01-08 NOTE — Assessment & Plan Note (Signed)
He had stable renal function on his last labs. No further testing is indicated.

## 2011-01-08 NOTE — Assessment & Plan Note (Signed)
I reviewed with him at length the anatomy from his recent CAT scan. He understands the importance of healthy lifestyle and good blood pressure control.

## 2011-04-17 ENCOUNTER — Other Ambulatory Visit: Payer: Self-pay | Admitting: Cardiology

## 2011-06-27 ENCOUNTER — Encounter: Payer: Self-pay | Admitting: Cardiology

## 2011-07-09 ENCOUNTER — Encounter: Payer: Self-pay | Admitting: Cardiology

## 2011-07-09 ENCOUNTER — Ambulatory Visit (INDEPENDENT_AMBULATORY_CARE_PROVIDER_SITE_OTHER): Payer: Medicaid Other | Admitting: Cardiology

## 2011-07-09 DIAGNOSIS — I10 Essential (primary) hypertension: Secondary | ICD-10-CM

## 2011-07-09 DIAGNOSIS — I7100 Dissection of unspecified site of aorta: Secondary | ICD-10-CM

## 2011-07-09 DIAGNOSIS — N259 Disorder resulting from impaired renal tubular function, unspecified: Secondary | ICD-10-CM

## 2011-07-09 MED ORDER — SIMVASTATIN 20 MG PO TABS: 20.0000 mg | ORAL_TABLET | Freq: Every day | ORAL | Status: DC

## 2011-07-09 MED ORDER — LISINOPRIL 10 MG PO TABS: 10.0000 mg | ORAL_TABLET | Freq: Every day | ORAL | Status: DC

## 2011-07-09 MED ORDER — METOPROLOL TARTRATE 100 MG PO TABS: 100.0000 mg | ORAL_TABLET | Freq: Two times a day (BID) | ORAL | Status: DC

## 2011-07-09 NOTE — Patient Instructions (Addendum)
Please increase your Metoprolol to 100 mg twice a day. Continue all other medications as listed.  Have blood work at [facility]  Follow up with Dr Antoine Poche in 4 months

## 2011-07-09 NOTE — Assessment & Plan Note (Signed)
Lab Results  Component Value Date   CREATININE 1.60* 06/14/2010   He needs a follow up creat.  I do not see one in the primary care chart.

## 2011-07-09 NOTE — Assessment & Plan Note (Signed)
I would like his blood pressure to be a little bit better controlled so I will increase the metoprolol to 100 mg twice a day. He will continue with his blood pressure diary.

## 2011-07-09 NOTE — Assessment & Plan Note (Signed)
He had a CT earlier this year and does have followup with Dr. Lyn Henri.

## 2011-07-09 NOTE — Progress Notes (Signed)
   HPI The patient presents for followup of hypertension and his aortic dissection. CT scan done earlier this year demonstrates a stable thoracic repair with continued true and false lumens filling in the descending aorta thoracic aorta.  He didn't bring his blood pressure diary but he reports that it's in the 130s to 140s systolic and 80s diastolic. He says it does go up with activity. He denies any chest pressure, neck or arm discomfort. He's had no palpitations, presyncope or syncope. He has had no PND or orthopnea. He's had no weight gain or edema.  Allergies  Allergen Reactions  . Codeine     Current Outpatient Prescriptions  Medication Sig Dispense Refill  . aspirin 325 MG tablet Take 325 mg by mouth daily.        Marland Kitchen lisinopril (PRINIVIL,ZESTRIL) 10 MG tablet Take 10 mg by mouth daily.        . metoprolol (LOPRESSOR) 50 MG tablet Please take one and 1/2 tablets in the am and in the pm  270 tablet  3  . simvastatin (ZOCOR) 20 MG tablet TAKE ONE TABLET BY MOUTH ONE TIME DAILY  90 tablet  2    Past Medical History  Diagnosis Date  . 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 Hypertension   . Tobacco abuse   . Nephrolithiasis     has history; status post stone removal  . Decreased left ventricular function June 10, 2010    EF of 40-45% w/ out regional wall motion abnormalities    ROS:  Insomnia.  Otherwise as stated in the HPI and negative for all other systems.  PHYSICAL EXAM BP 127/74  Pulse 78  Ht 5\' 9"  (1.753 m)  Wt 197 lb (89.359 kg)  BMI 29.09 kg/m2 GENERAL:  Well appearing HEENT:  Pupils equal round and reactive, fundi not visualized, oral mucosa unremarkable NECK:  No jugular venous distention, waveform within normal limits, carotid upstroke brisk and symmetric, no bruits, no thyromegaly LYMPHATICS:  No cervical, inguinal adenopathy LUNGS:  Clear to auscultation bilaterally BACK:  No  CVA tenderness CHEST:  Well healed sternotomy scar and right upper chest pain. HEART:  PMI not displaced or sustained,S1 and S2 within normal limits, no S3, no S4, no clicks, no rubs, no murmurs ABD:  Flat, positive bowel sounds normal in frequency in pitch, no bruits, no rebound, no guarding, no midline pulsatile mass, no hepatomegaly, no splenomegaly EXT:  2 plus pulses throughout, no edema, no cyanosis no clubbing SKIN:  No rashes no nodules NEURO:  Cranial nerves II through XII grossly intact, motor grossly intact throughout PSYCH:  Cognitively intact, oriented to person place and time  EKG:  Sinus rhythm, rate 72, axis within normal limits, intervals within normal limits, no acute ST-T wave changes.  07/09/2011    ASSESSMENT AND PLAN

## 2011-10-15 ENCOUNTER — Encounter: Payer: Self-pay | Admitting: Internal Medicine

## 2011-10-22 ENCOUNTER — Ambulatory Visit (INDEPENDENT_AMBULATORY_CARE_PROVIDER_SITE_OTHER): Payer: Medicaid Other | Admitting: Cardiology

## 2011-10-22 ENCOUNTER — Encounter: Payer: Self-pay | Admitting: Cardiology

## 2011-10-22 VITALS — BP 155/80 | HR 77 | Ht 69.0 in | Wt 199.0 lb

## 2011-10-22 DIAGNOSIS — I10 Essential (primary) hypertension: Secondary | ICD-10-CM

## 2011-10-22 DIAGNOSIS — I7100 Dissection of unspecified site of aorta: Secondary | ICD-10-CM

## 2011-10-22 DIAGNOSIS — N259 Disorder resulting from impaired renal tubular function, unspecified: Secondary | ICD-10-CM

## 2011-10-22 MED ORDER — METOPROLOL TARTRATE 25 MG PO TABS: 25.0000 mg | ORAL_TABLET | Freq: Two times a day (BID) | ORAL | Status: DC

## 2011-10-22 NOTE — Patient Instructions (Signed)
Please increase metoprolol by 25 mg one twice a day Continue all other medications as listed.  Follow up in 6 months with Dr Antoine Poche.  You will receive a letter in the mail 2 months before you are due.  Please call us when you receive this letter to schedule your follow up appointment.

## 2011-10-22 NOTE — Assessment & Plan Note (Signed)
I would like his blood pressure to be better controlled. Will increase his metoprolol to 125 mg twice a day. He will continue the other medicines as listed.

## 2011-10-22 NOTE — Assessment & Plan Note (Signed)
His creatinine recently was 1.49. This is followed by his primary provider.

## 2011-10-22 NOTE — Assessment & Plan Note (Signed)
He's had no symptoms related to this. I will discuss with Dr. Tyrone Sage any indication for surveillance CT.

## 2011-10-22 NOTE — Progress Notes (Signed)
   HPI The patient presents for followup of hypertension and his aortic dissection. CT scan done last year demonstrates a stable thoracic repair with continued true and false lumens filling in the descending aorta thoracic aorta.  He reports that it's in the 130s to 140s systolic and 80s diastolic. He denies any chest pressure, neck or arm discomfort. He's had no palpitations, presyncope or syncope. He has had no PND or orthopnea. He's had no weight gain or edema.  He says that he is active walking.  Allergies  Allergen Reactions  . Codeine     Current Outpatient Prescriptions  Medication Sig Dispense Refill  . aspirin 325 MG tablet Take 325 mg by mouth daily.        Marland Kitchen lisinopril (PRINIVIL,ZESTRIL) 10 MG tablet Take 1 tablet (10 mg total) by mouth daily.  30 tablet  11  . metoprolol (LOPRESSOR) 100 MG tablet Take 1 tablet (100 mg total) by mouth 2 (two) times daily.  60 tablet  11  . simvastatin (ZOCOR) 20 MG tablet Take 1 tablet (20 mg total) by mouth at bedtime.  30 tablet  11    Past Medical History  Diagnosis Date  . 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 Hypertension   . Tobacco abuse   . Nephrolithiasis     has history; status post stone removal  . Decreased left ventricular function June 10, 2010    EF of 40-45% w/ out regional wall motion abnormalities    ROS:  Rash on the small of his back and legs.  Otherwise as stated in the HPI and negative for all other systems.  PHYSICAL EXAM BP 155/80  Pulse 77  Ht 5\' 9"  (1.753 m)  Wt 199 lb (90.266 kg)  BMI 29.39 kg/m2 GENERAL:  Well appearing HEENT:  Pupils equal round and reactive, fundi not visualized, oral mucosa unremarkable, poor dentition NECK:  No jugular venous distention, waveform within normal limits, carotid upstroke brisk and symmetric, no bruits, no thyromegaly LYMPHATICS:  No cervical, inguinal adenopathy LUNGS:  Clear to  auscultation bilaterally BACK:  No CVA tenderness CHEST:  Well healed sternotomy scar and right upper chest pain. HEART:  PMI not displaced or sustained,S1 and S2 within normal limits, no S3, no S4, no clicks, no rubs, brief right upper sternal border murmur ABD:  Flat, positive bowel sounds normal in frequency in pitch, no bruits, no rebound, no guarding, no midline pulsatile mass, no hepatomegaly, no splenomegaly EXT:  2 plus pulses throughout, no edema, no cyanosis no clubbing SKIN:  Small focal red raised lesions on the lower back and ankles NEURO:  Cranial nerves II through XII grossly intact, motor grossly intact throughout PSYCH:  Cognitively intact, oriented to person place and time  EKG:  Sinus rhythm, rate 81 axis within normal limits, intervals within normal limits, no acute ST-T wave changes.  10/22/2011  ASSESSMENT AND PLAN

## 2011-11-19 ENCOUNTER — Other Ambulatory Visit: Payer: Self-pay | Admitting: Cardiothoracic Surgery

## 2011-11-19 ENCOUNTER — Other Ambulatory Visit: Payer: Self-pay | Admitting: Thoracic Surgery

## 2011-11-19 DIAGNOSIS — I7101 Dissection of thoracic aorta: Secondary | ICD-10-CM

## 2011-11-20 ENCOUNTER — Other Ambulatory Visit: Payer: Self-pay | Admitting: Cardiothoracic Surgery

## 2011-11-20 DIAGNOSIS — I7101 Dissection of thoracic aorta: Secondary | ICD-10-CM

## 2011-12-18 ENCOUNTER — Other Ambulatory Visit: Payer: Medicaid Other

## 2011-12-18 ENCOUNTER — Ambulatory Visit: Payer: Medicaid Other | Admitting: Cardiothoracic Surgery

## 2011-12-25 ENCOUNTER — Ambulatory Visit: Payer: Medicaid Other | Admitting: Cardiothoracic Surgery

## 2011-12-25 ENCOUNTER — Encounter: Payer: Self-pay | Admitting: Cardiothoracic Surgery

## 2011-12-25 ENCOUNTER — Ambulatory Visit
Admission: RE | Admit: 2011-12-25 | Discharge: 2011-12-25 | Disposition: A | Discharge status: Home / Self Care | Payer: Medicaid Other | Source: Ambulatory Visit | Attending: Cardiothoracic Surgery | Admitting: Cardiothoracic Surgery

## 2011-12-25 ENCOUNTER — Ambulatory Visit (INDEPENDENT_AMBULATORY_CARE_PROVIDER_SITE_OTHER): Payer: Medicaid Other | Admitting: Cardiothoracic Surgery

## 2011-12-25 VITALS — BP 118/71 | HR 70 | Resp 16

## 2011-12-25 DIAGNOSIS — I7101 Dissection of thoracic aorta: Secondary | ICD-10-CM

## 2011-12-25 DIAGNOSIS — Z8679 Personal history of other diseases of the circulatory system: Secondary | ICD-10-CM

## 2011-12-25 MED ORDER — IOHEXOL 350 MG/ML SOLN
100.0000 mL | Freq: Once | INTRAVENOUS | Status: AC | PRN
  Administered 2011-12-25: 100 mL via INTRAVENOUS

## 2011-12-25 NOTE — Progress Notes (Signed)
[address]            [place] [address]          [phone]      JIHAN RUDY [facility] Medical Record #[id number] Date of Birth: 06/15/1956  Referring: Joya Gaskins, MD Primary Care: Rudi Heap, MD, MD  Chief Complaint:    Chief Complaint  Patient presents with  . Routine Post Op    for hx of aortic dissection , s/p surgery 06/2010.Marland KitchenMarland KitchenCTA CHEST/ABD    History of Present Illness:    Patient returns today for followup visit after repair of a descending aortic dissection and resuspension of the aortic valve in November of 2011. He continues to see cardiology for control of his blood pressure      Current Activity/ Functional Status: Patient is independent with mobility/ambulation, transfers, ADL's, IADL's.   Past Medical History  Diagnosis Date  . 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 Hypertension   . Tobacco abuse   . Nephrolithiasis     has history; status post stone removal  . Decreased left ventricular function June 10, 2010    EF of 40-45% w/ out regional wall motion abnormalities    History reviewed. No pertinent past surgical history.  Family History  Problem Relation Age of Onset  . Lung disease Mother   . Microcephaly Father   . Microcephaly Brother     History   Social History  . Marital Status: Legally Separated    Spouse Name: N/A    Number of Children: N/A  . Years of Education: N/A   Occupational History  . Not on file.   Social History Main Topics  . Smoking status: Former Smoker    Quit date: 08/11/2002  . Smokeless tobacco: Not on file  . Alcohol Use: Not on file  . Drug Use: Not on file  . Sexually Active: Not on file   Other Topics Concern  . Not on file   Social History Narrative   Has 20-pack-year history of tobacco abuse, quitting many years ago. Denies alcohol or drugs. Does not routinely  exercise     History  Smoking status  . Former Smoker  . Quit date: 08/11/2002  Smokeless tobacco  . Not on file    History  Alcohol Use: Not on file     Allergies  Allergen Reactions  . Codeine     Current Outpatient Prescriptions  Medication Sig Dispense Refill  . aspirin 325 MG tablet Take 325 mg by mouth daily.        . B Complex-C (B-COMPLEX WITH VITAMIN C) tablet Take 1 tablet by mouth daily.      . Iron 66 MG TABS Take 45 mg by mouth.      Marland Kitchen lisinopril (PRINIVIL,ZESTRIL) 10 MG tablet Take 1 tablet (10 mg total) by mouth daily.  30 tablet  11  . metoprolol (LOPRESSOR) 100 MG tablet Take 1 tablet (100 mg total) by mouth 2 (two) times daily.  60 tablet  11  . metoprolol tartrate (LOPRESSOR) 25 MG tablet Take 1 tablet (25 mg total) by mouth 2 (two) times daily.  60 tablet  11  . simvastatin (ZOCOR) 20 MG tablet Take 1 tablet (20 mg total) by mouth at bedtime.  30 tablet  11  No current facility-administered medications for this visit.   Facility-Administered Medications Ordered in Other Visits  Medication Dose Route Frequency Provider Last Rate Last Dose  . iohexol (OMNIPAQUE) 350 MG/ML injection 100 mL  100 mL Intravenous Once PRN Medication Radiologist, MD   100 mL at 12/25/11 1439       Review of Systems:     Cardiac Review of Systems: Y or N  Chest Pain [  n ]  Resting SOB [n] Exertional SOB  [n  ]  Orthopnea [n]   Pedal Edema [ n  ]    Palpitations [n  ] Syncope  [  n]   Presyncope [ n ]  General Review of Systems: [Y] = yes [  ]=no Constitional: recent weight change [  ]; anorexia [  ]; fatigue [  ]; nausea [  ]; night sweats [  ]; fever [  ]; or chills [  ];                                                                                                                                          Dental: poor dentition[ y ];  Eye : blurred vision [  ]; diplopia [   ]; vision changes [  ];  Amaurosis fugax[  ]; Resp: cough [  ];  wheezing[  ];  hemoptysis[  ];  shortness of breath[  ]; paroxysmal nocturnal dyspnea[  ]; dyspnea on exertion[  ]; or orthopnea[  ];  GI:  gallstones[  ], vomiting[  ];  dysphagia[  ]; melena[  ];  hematochezia [  ]; heartburn[  ];   Hx of  Colonoscopy[  ]; GU: kidney stones [  ]; hematuria[  ];   dysuria [  ];  nocturia[  ];  history of     obstruction [  ];             Skin: rash, swelling[  ];, hair loss[  ];  peripheral edema[  ];  or itching[  ]; Musculosketetal: myalgias[  ];  joint swelling[  ];  joint erythema[  ];  joint pain[  ];  back pain[  ];  Heme/Lymph: bruising[  ];  bleeding[  ];  anemia[  ];  Neuro: TIA[  ];  headaches[  ];  stroke[  ];  vertigo[  ];  seizures[  ];   paresthesias[  ];  difficulty walking[  ];  Psych:depression[  ]; anxiety[  ];  Endocrine: diabetes[  ];  thyroid dysfunction[  ];  Immunizations: Flu [?  ]; Pneumococcal[?  ];  Other:  Physical Exam: BP 118/71  Pulse 70  Resp 16  SpO2 95%  General appearance: alert, cooperative, appears stated age and no distress Neurologic: intact Heart: regular rate and rhythm, S1, S2 normal, no murmur, click, rub or gallop and normal apical impulse Lungs: clear to auscultation bilaterally and normal percussion  bilaterally Abdomen: soft, non-tender; bowel sounds normal; no masses,  no organomegaly Extremities: extremities normal, atraumatic, no cyanosis or edema and Homans sign is negative, no sign of DVT   Diagnostic Studies & Laboratory data:     Recent Radiology Findings:   Ct Angio Chest W/cm &/or Wo Cm  12/25/2011  *RADIOLOGY REPORT*  Clinical Data:  Thoracic aortic dissection, post ascending aortic repair (2012).  No current complaints.  CT ANGIOGRAPHY CHEST, ABDOMEN AND PELVIS  Technique:  Multidetector CT imaging through the chest, abdomen and pelvis was performed using the standard protocol during bolus administration of intravenous contrast.  Multiplanar reconstructed images including MIPs were obtained and reviewed to evaluate the  vascular anatomy.  Contrast: OMNIPAQUE IOHEXOL 350 MG/ML SOLN,  Comparison:  Dissection protocol CT of chest abdomen pelvis - 12/12/2010; chest CT - 06/10/2010  CTA CHEST  Vascular Findings:  Unchanged appearance of ascending aortic thoracic aortic repair. Review of noncontrast images are negative for intramural hematoma formation.  The ascending thoracic aorta is unchanged in size measuring approximately 3.5 x 3.5 cm (as measured at the level of the main pulmonary artery (image 56, series 5).  The external diameter of the posterior aspect of the aortic arch has minimally increased in size, measuring approximately 3.8 x 4 cm in greatest coronal dimension (image 62, series 601) previously, 3.6 x 3.7 cm.  The proximal aspect of the descending thoracic aorta has also minimally increased in size, now measuring approximately 3.3 x 3.4 cm (image 56, series five) previously, 3.1 x 3.3 cm.  The distal aspect of the descending thoracic aorta is unchanged in size measuring approximately 2.8 x 3.0 cm as measured at the level of the diaphragmatic hiatus (image 105, series five).  There is persistent findings of a type A thoracic aortic dissection which extends to involve the innominate artery to the level of the vessel's bifurcation into the subclavian and common carotid artery. There is no definite extension of the dissection past the innominate artery bifurcation.  The visualized portions of the cervical vasculature are patent.  The dissection flap extends throughout the entirety of the thoracic aorta.  Review of the precontrast images are negative for intramural hematoma formation. No periaortic stranding.  Review of the MIP images confirms the above findings.  Nonvascular findings:  Indeterminate noncalcified approximately 4 mm subpleural nodule in the right upper lobe (image 13) is grossly unchanged compared to most remote chest CT. No new pulmonary nodules.  Mild centrilobular emphysematous change.  Minimal  bibasilar dependent atelectasis, left greater than right.  No focal airspace opacities.  No pleural effusion.  No pneumothorax.  Central airways are patent.  Shoddy mediastinal lymph nodes are grossly unchanged with an index previous tracheal carinal lymph node measuring 7 mm in short axis diameter.  Decreased postoperative stranding within the anterior mediastinum.  Grossly symmetric axillary lymph nodes are grossly unchanged several which contain benign fatty hila presumably reactive in etiology.  No acute or aggressive osseous abnormalities.  Post median sternotomy.  IMPRESSION: 1.  Stable sequela of ascending aortic repair with unchanged caliber of the ascending thoracic aorta. 2.  Minimal enlargement of the posterior aspect of the aortic arch and proximal aspect of the descending thoracic aorta as above. 3.  Persistent findings of a Type A thoracic aortic dissection with extension to involve the innominate artery.  4. Technically indeterminate 4 mm subpleural nodule within the right upper lobe has been stable since the 05/2010 examination. Continued attention on follow-up is recommended.  CTA  ABDOMEN AND PELVIS  Vascular Findings:  There is grossly unchanged extension of the thoracic aortic dissection throughout the entirety of the abdominal aorta.  The dissection terminates regional to the bilateral common iliac artery bifurcations with short extension into the right external and the left internal iliac arteries.  The presumed false lumen again supplies the celiac, SMA IMA and both duplicated left renal arteries.  The dissection flap again extends to involve a short segment of the proximal aspect of the right renal artery.  The dissection extends to involve the origin of the dominant left sided duplicated renal artery.  The bilateral kidneys are symmetric in size and there is no evidence of renal infarction.  Normal caliber of the abdominal aorta.  Incidental note is made of a retroaortic left renal vein.   There is unchanged mild fusiform dilatation of the bilateral common iliac arteries, each measuring approximately 1.7 x 1.5 cm (image 171, series five).  Review of the MIP images confirms the above findings.  Nonvascular findings:  Evaluation of the abdominal organs is limited to the arterial phase of enhancement.  Normal hepatic contour.  Unchanged cyst within the left lobe of the liver.  Sub centimeter lesion within the right lobe the liver is unchanged but favored to represent an additional hepatic cyst but remains too small to accurately characterize.   No ascites.  Normal gallbladder.  No intra or extrahepatic biliary duct dilatation.  There is symmetric enhancement the bilateral kidneys.  Unchanged left-sided renal cysts.  No urinary obstruction.  No discrete renal stones on the post contrast examination.  Normal early arterial phase evaluation of the bilateral adrenal glands and pancreas.  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  No peripancreatic stranding.  No pancreatic ductal dilatation or atrophy.  Scattered colonic diverticulosis without evidence of diverticulitis.  The bowel is otherwise normal in course and caliber without wall thickening or evidence of obstruction.  Normal appearance of the appendix.  No pneumoperitoneum, pneumatosis or portal venous gas.  No retroperitoneal, mesenteric or pelvic lymphadenopathy.  Symmetric bilateral inguinal lymph nodes are presumably reactive in etiology.  Pelvic organs are normal.  No free fluid in the pelvis.  No acute or aggressive osseous abnormalities within the abdomen or pelvis.  IMPRESSION:  1.  Persistent extension of the thoracic aortic dissection throughout the entirety of the abdominal aorta with persistent extension of the dissection to involve the proximal aspect of the right renal artery.  No evidence of end organ ischemia.  2.  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  3. Colonic diverticulosis without evidence of diverticulitis.  Original Report Authenticated By: Waynard Reeds, M.D.   Ct Angio Abdomen W/cm &/or Wo Contrast  12/25/2011  *RADIOLOGY REPORT*  Clinical Data:  Thoracic aortic dissection, post ascending aortic repair (2012).  No current complaints.  CT ANGIOGRAPHY CHEST, ABDOMEN AND PELVIS  Technique:  Multidetector CT imaging through the chest, abdomen and pelvis was performed using the standard protocol during bolus administration of intravenous contrast.  Multiplanar reconstructed images including MIPs were obtained and reviewed to evaluate the vascular anatomy.  Contrast: OMNIPAQUE IOHEXOL 350 MG/ML SOLN,  Comparison:  Dissection protocol CT of chest abdomen pelvis - 12/12/2010; chest CT - 06/10/2010  CTA CHEST  Vascular Findings:  Unchanged appearance of ascending aortic thoracic aortic repair. Review of noncontrast images are negative for intramural hematoma formation.  The ascending thoracic  aorta is unchanged in size measuring approximately 3.5 x 3.5 cm (as measured at the level of the main pulmonary artery (image 56, series 5).  The external diameter of the posterior aspect of the aortic arch has minimally increased in size, measuring approximately 3.8 x 4 cm in greatest coronal dimension (image 62, series 601) previously, 3.6 x 3.7 cm.  The proximal aspect of the descending thoracic aorta has also minimally increased in size, now measuring approximately 3.3 x 3.4 cm (image 56, series five) previously, 3.1 x 3.3 cm.  The distal aspect of the descending thoracic aorta is unchanged in size measuring approximately 2.8 x 3.0 cm as measured at the level of the diaphragmatic hiatus (image 105, series five).  There is persistent findings of a type A thoracic aortic dissection which extends to involve the innominate artery to the level of the vessel's bifurcation into the subclavian and  common carotid artery. There is no definite extension of the dissection past the innominate artery bifurcation.  The visualized portions of the cervical vasculature are patent.  The dissection flap extends throughout the entirety of the thoracic aorta.  Review of the precontrast images are negative for intramural hematoma formation. No periaortic stranding.  Review of the MIP images confirms the above findings.  Nonvascular findings:  Indeterminate noncalcified approximately 4 mm subpleural nodule in the right upper lobe (image 13) is grossly unchanged compared to most remote chest CT. No new pulmonary nodules.  Mild centrilobular emphysematous change.  Minimal bibasilar dependent atelectasis, left greater than right.  No focal airspace opacities.  No pleural effusion.  No pneumothorax.  Central airways are patent.  Shoddy mediastinal lymph nodes are grossly unchanged with an index previous tracheal carinal lymph node measuring 7 mm in short axis diameter.  Decreased postoperative stranding within the anterior mediastinum.  Grossly symmetric axillary lymph nodes are grossly unchanged several which contain benign fatty hila presumably reactive in etiology.  No acute or aggressive osseous abnormalities.  Post median sternotomy.  IMPRESSION: 1.  Stable sequela of ascending aortic repair with unchanged caliber of the ascending thoracic aorta. 2.  Minimal enlargement of the posterior aspect of the aortic arch and proximal aspect of the descending thoracic aorta as above. 3.  Persistent findings of a Type A thoracic aortic dissection with extension to involve the innominate artery.  4. Technically indeterminate 4 mm subpleural nodule within the right upper lobe has been stable since the 05/2010 examination. Continued attention on follow-up is recommended.  CTA ABDOMEN AND PELVIS  Vascular Findings:  There is grossly unchanged extension of the thoracic aortic dissection throughout the entirety of the abdominal aorta.  The  dissection terminates regional to the bilateral common iliac artery bifurcations with short extension into the right external and the left internal iliac arteries.  The presumed false lumen again supplies the celiac, SMA IMA and both duplicated left renal arteries.  The dissection flap again extends to involve a short segment of the proximal aspect of the right renal artery.  The dissection extends to involve the origin of the dominant left sided duplicated renal artery.  The bilateral kidneys are symmetric in size and there is no evidence of renal infarction.  Normal caliber of the abdominal aorta.  Incidental note is made of a retroaortic left renal vein.  There is unchanged mild fusiform dilatation of the bilateral common iliac arteries, each measuring approximately 1.7 x 1.5 cm (image 171, series five).  Review of the MIP images confirms the above findings.  Nonvascular  findings:  Evaluation of the abdominal organs is limited to the arterial phase of enhancement.  Normal hepatic contour.  Unchanged cyst within the left lobe of the liver.  Sub centimeter lesion within the right lobe the liver is unchanged but favored to represent an additional hepatic cyst but remains too small to accurately characterize.   No ascites.  Normal gallbladder.  No intra or extrahepatic biliary duct dilatation.  There is symmetric enhancement the bilateral kidneys.  Unchanged left-sided renal cysts.  No urinary obstruction.  No discrete renal stones on the post contrast examination.  Normal early arterial phase evaluation of the bilateral adrenal glands and pancreas.  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  No peripancreatic stranding.  No pancreatic ductal dilatation or atrophy.  Scattered colonic diverticulosis without evidence of diverticulitis.  The bowel is otherwise normal in course and caliber without  wall thickening or evidence of obstruction.  Normal appearance of the appendix.  No pneumoperitoneum, pneumatosis or portal venous gas.  No retroperitoneal, mesenteric or pelvic lymphadenopathy.  Symmetric bilateral inguinal lymph nodes are presumably reactive in etiology.  Pelvic organs are normal.  No free fluid in the pelvis.  No acute or aggressive osseous abnormalities within the abdomen or pelvis.  IMPRESSION:  1.  Persistent extension of the thoracic aortic dissection throughout the entirety of the abdominal aorta with persistent extension of the dissection to involve the proximal aspect of the right renal artery.  No evidence of end organ ischemia.  2. 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  3. Colonic diverticulosis without evidence of diverticulitis.  Original Report Authenticated By: Waynard Reeds, M.D.      Recent Lab Findings: Lab Results  Component Value Date   WBC 13.3* 06/14/2010   HGB 9.3* 06/14/2010   HCT 28.6* 06/14/2010   PLT 148* 06/14/2010   GLUCOSE 114* 06/14/2010   CHOL  Value: 88 (NOTE) ATP III Classification:      < 200        mg/dL        Desirable     213 - 239     mg/dL        Borderline High     >= 240        mg/dL        High  03/17/5783   TRIG 91 06/14/2010   HDL 26* 06/14/2010   LDLCALC  Value: 44 (NOTE)  Total Cholesterol/HDL Ratio:CHD Risk                       Coronary Heart Disease Risk Table                                       Men       Women         1/2 Average Risk              3.4        3.3             Average Risk              5.0         4.4         2 X Average Risk  9.6        7.1         3 X Average Risk             23.4       11.0 Use the calculated Patient Ratio above and the CHD Risk table  to determine the patient's CHD Risk. ATP III Classification (LDL):      < 100         mg/dL         Optimal     161 - 129     mg/dL         Near or Above Optimal     130 - 159     mg/dL         Borderline High     160 - 189      mg/dL         High      > 096        mg/dL         Very High  11/13/4096   ALT 18 06/09/2010   AST 18 06/09/2010   NA 139 06/14/2010   K 4.2 06/14/2010   CL 107 06/14/2010   CREATININE 1.60* 06/14/2010   BUN 29* 06/14/2010   CO2 26 06/14/2010   INR 0.82 06/10/2010      Assessment / Plan:     Patient returns today in followup after repair of a descending aorta and resuspension of his aortic valve for acute aortic dissection in 2011. He has a persistent false lumen and the arch and descending thoracic aorta, followup CT scans shows stable situation compared to previous scans. I stressed to him the importance to continue with good blood pressure control, including beta blocker which he appears to be doing. We'll plan to see him back in one year with a repeat CTA of the chest.       Delight Ovens MD  Beeper [phone] Office [phone] 12/25/2011 5:12 PM

## 2012-05-10 ENCOUNTER — Other Ambulatory Visit: Payer: Self-pay | Admitting: Cardiology

## 2012-05-10 MED ORDER — SIMVASTATIN 20 MG PO TABS: 20.0000 mg | ORAL_TABLET | Freq: Every day | ORAL | Status: DC

## 2012-05-10 NOTE — Telephone Encounter (Signed)
Pt needs a refill asap and Pharm is confirmed

## 2012-07-05 ENCOUNTER — Other Ambulatory Visit: Payer: Self-pay | Admitting: Cardiology

## 2012-10-04 ENCOUNTER — Other Ambulatory Visit: Payer: Self-pay | Admitting: Cardiology

## 2012-10-04 DIAGNOSIS — I10 Essential (primary) hypertension: Secondary | ICD-10-CM

## 2012-10-04 MED ORDER — METOPROLOL TARTRATE 25 MG PO TABS: 25.0000 mg | ORAL_TABLET | Freq: Two times a day (BID) | ORAL | Status: DC

## 2012-12-16 ENCOUNTER — Telehealth: Payer: Self-pay | Admitting: Physician Assistant

## 2012-12-16 ENCOUNTER — Telehealth: Payer: Self-pay | Admitting: Cardiology

## 2012-12-16 NOTE — Telephone Encounter (Signed)
Advised paperwork had been received - we have not seen the pt since 3/13 and do not know of any disability at this point.  Paperwork has been mailed back indicating this.  She states understanding.

## 2012-12-16 NOTE — Telephone Encounter (Signed)
New problem   Calling from [facility] to see if dr hochrein received a verification for disability form that was mailed to him-she will only be in the office until 12 noon today

## 2012-12-16 NOTE — Telephone Encounter (Signed)
Patient has had rash for quite some time. Gave appt for 5-12

## 2012-12-17 ENCOUNTER — Other Ambulatory Visit: Payer: Self-pay | Admitting: *Deleted

## 2012-12-17 DIAGNOSIS — I712 Thoracic aortic aneurysm, without rupture: Secondary | ICD-10-CM

## 2012-12-17 DIAGNOSIS — I7100 Dissection of unspecified site of aorta: Secondary | ICD-10-CM

## 2012-12-20 ENCOUNTER — Ambulatory Visit (INDEPENDENT_AMBULATORY_CARE_PROVIDER_SITE_OTHER): Payer: Medicare Other | Admitting: General Practice

## 2012-12-20 ENCOUNTER — Encounter: Payer: Self-pay | Admitting: General Practice

## 2012-12-20 VITALS — BP 123/69 | HR 72 | Temp 97.5°F | Ht 67.75 in | Wt 192.0 lb

## 2012-12-20 DIAGNOSIS — L299 Pruritus, unspecified: Secondary | ICD-10-CM | POA: Diagnosis not present

## 2012-12-20 DIAGNOSIS — L259 Unspecified contact dermatitis, unspecified cause: Secondary | ICD-10-CM

## 2012-12-20 DIAGNOSIS — L0390 Cellulitis, unspecified: Secondary | ICD-10-CM

## 2012-12-20 DIAGNOSIS — L0291 Cutaneous abscess, unspecified: Secondary | ICD-10-CM | POA: Diagnosis not present

## 2012-12-20 DIAGNOSIS — L309 Dermatitis, unspecified: Secondary | ICD-10-CM

## 2012-12-20 MED ORDER — CEPHALEXIN 500 MG PO CAPS: 500.0000 mg | ORAL_CAPSULE | Freq: Two times a day (BID) | ORAL | Status: DC

## 2012-12-20 MED ORDER — HYDROXYZINE HCL 25 MG PO TABS: 25.0000 mg | ORAL_TABLET | Freq: Every evening | ORAL | Status: DC | PRN

## 2012-12-20 NOTE — Patient Instructions (Addendum)
Contact Dermatitis Contact dermatitis is a reaction to certain substances that touch the skin. Contact dermatitis can be either irritant contact dermatitis or allergic contact dermatitis. Irritant contact dermatitis does not require previous exposure to the substance for a reaction to occur.Allergic contact dermatitis only occurs if you have been exposed to the substance before. Upon a repeat exposure, your body reacts to the substance.  CAUSES  Many substances can cause contact dermatitis. Irritant dermatitis is most commonly caused by repeated exposure to mildly irritating substances, such as:  Makeup.  Soaps.  Detergents.  Bleaches.  Acids.  Metal salts, such as nickel. Allergic contact dermatitis is most commonly caused by exposure to:  Poisonous plants.  Chemicals (deodorants, shampoos).  Jewelry.  Latex.  Neomycin in triple antibiotic cream.  Preservatives in products, including clothing. SYMPTOMS  The area of skin that is exposed may develop:  Dryness or flaking.  Redness.  Cracks.  Itching.  Pain or a burning sensation.  Blisters. With allergic contact dermatitis, there may also be swelling in areas such as the eyelids, mouth, or genitals.  DIAGNOSIS  Your caregiver can usually tell what the problem is by doing a physical exam. In cases where the cause is uncertain and an allergic contact dermatitis is suspected, a patch skin test may be performed to help determine the cause of your dermatitis. TREATMENT Treatment includes protecting the skin from further contact with the irritating substance by avoiding that substance if possible. Barrier creams, powders, and gloves may be helpful. Your caregiver may also recommend:  Steroid creams or ointments applied 2 times daily. For best results, soak the rash area in cool water for 20 minutes. Then apply the medicine. Cover the area with a plastic wrap. You can store the steroid cream in the refrigerator for a "chilly"  effect on your rash. That may decrease itching. Oral steroid medicines may be needed in more severe cases.  Antibiotics or antibacterial ointments if a skin infection is present.  Antihistamine lotion or an antihistamine taken by mouth to ease itching.  Lubricants to keep moisture in your skin.  Burow's solution to reduce redness and soreness or to dry a weeping rash. Mix one packet or tablet of solution in 2 cups cool water. Dip a clean washcloth in the mixture, wring it out a bit, and put it on the affected area. Leave the cloth in place for 30 minutes. Do this as often as possible throughout the day.  Taking several cornstarch or baking soda baths daily if the area is too large to cover with a washcloth. Harsh chemicals, such as alkalis or acids, can cause skin damage that is like a burn. You should flush your skin for 15 to 20 minutes with cold water after such an exposure. You should also seek immediate medical care after exposure. Bandages (dressings), antibiotics, and pain medicine may be needed for severely irritated skin.  HOME CARE INSTRUCTIONS  Avoid the substance that caused your reaction.  Keep the area of skin that is affected away from hot water, soap, sunlight, chemicals, acidic substances, or anything else that would irritate your skin.  Do not scratch the rash. Scratching may cause the rash to become infected.  You may take cool baths to help stop the itching.  Only take over-the-counter or prescription medicines as directed by your caregiver.  See your caregiver for follow-up care as directed to make sure your skin is healing properly. SEEK MEDICAL CARE IF:   Your condition is not better after 3   days of treatment.  You seem to be getting worse.  You see signs of infection such as swelling, tenderness, redness, soreness, or warmth in the affected area.  You have any problems related to your medicines. Document Released: 07/25/2000 Document Revised: 10/20/2011  Document Reviewed: 12/31/2010 [facility] Patient Information 2013 [place], [place]. Cellulitis Cellulitis is an infection of the skin and the tissue beneath it. The infected area is usually red and tender. Cellulitis occurs most often in the arms and lower legs.  CAUSES  Cellulitis is caused by bacteria that enter the skin through cracks or cuts in the skin. The most common types of bacteria that cause cellulitis are Staphylococcus and Streptococcus. SYMPTOMS   Redness and warmth.  Swelling.  Tenderness or pain.  Fever. DIAGNOSIS  Your caregiver can usually determine what is wrong based on a physical exam. Blood tests may also be done. TREATMENT  Treatment usually involves taking an antibiotic medicine. HOME CARE INSTRUCTIONS   Take your antibiotics as directed. Finish them even if you start to feel better.  Keep the infected arm or leg elevated to reduce swelling.  Apply a warm cloth to the affected area up to 4 times per day to relieve pain.  Only take over-the-counter or prescription medicines for pain, discomfort, or fever as directed by your caregiver.  Keep all follow-up appointments as directed by your caregiver. SEEK MEDICAL CARE IF:   You notice red streaks coming from the infected area.  Your red area gets larger or turns dark in color.  Your bone or joint underneath the infected area becomes painful after the skin has healed.  Your infection returns in the same area or another area.  You notice a swollen bump in the infected area.  You develop new symptoms. SEEK IMMEDIATE MEDICAL CARE IF:   You have a fever.  You feel very sleepy.  You develop vomiting or diarrhea.  You have a general ill feeling (malaise) with muscle aches and pains. MAKE SURE YOU:   Understand these instructions.  Will watch your condition.  Will get help right away if you are not doing well or get worse. Document Released: 05/07/2005 Document Revised: 01/27/2012 Document Reviewed:  10/13/2011 [facility] Patient Information 2013 [place], [place].

## 2012-12-20 NOTE — Progress Notes (Signed)
  Subjective:    Patient ID: Nicholas Palmer, male    DOB: 1956/04/20, 57 y.o.   MRN: [id number]  Rash This is a chronic (since October 2011 after open heart surgery) problem. The current episode started more than 1 year ago. The problem is unchanged. The affected locations include the groin, abdomen and left buttock. The rash is characterized by itchiness, scaling and redness. He was exposed to nothing. Associated symptoms include nail changes. Pertinent negatives include no cough, fever or shortness of breath. (Nails changed after surgery) Past treatments include nothing. The treatment provided no relief. There is no history of allergies.      Review of Systems  Constitutional: Negative for fever and chills.  Respiratory: Negative for cough, chest tightness and shortness of breath.   Cardiovascular: Negative for chest pain.  Genitourinary: Negative for difficulty urinating.  Skin: Positive for nail changes and rash.       Itchy, red, scales rash  Neurological: Negative for dizziness and headaches.  Psychiatric/Behavioral: Negative.        Objective:   Physical Exam  Constitutional: He is oriented to person, place, and time. He appears well-developed and well-nourished.  HENT:  Head: Normocephalic and atraumatic.  Cardiovascular: Normal rate, regular rhythm and normal heart sounds.   Pulmonary/Chest: Effort normal and breath sounds normal. No respiratory distress. He exhibits no tenderness.  Neurological: He is alert and oriented to person, place, and time.  Skin: Skin is warm and dry. Rash noted. Rash is maculopapular. There is erythema.  Rash noted to upper buttocks, groin (bilateral), and lower abdomen. Erythematous base with some scaled area  Psychiatric: He has a normal mood and affect.          Assessment & Plan:  1. Dermatitis - Ambulatory referral to Dermatology  2. Itching - hydrOXYzine (ATARAX/VISTARIL) 25 MG tablet; Take 1 tablet (25 mg total) by mouth at bedtime  as needed for itching.  Dispense: 30 tablet; Refill: 0 - Ambulatory referral to Dermatology  3. Cellulitis - cephALEXin (KEFLEX) 500 MG capsule; Take 1 capsule (500 mg total) by mouth 2 (two) times daily.  Dispense: 20 capsule; Refill: 0 RTO if symptoms worsen Keep area clean and dry Proper hand washing Referral to dermatology Patient verbalized understanding Coralie Keens, FNP-C

## 2012-12-22 ENCOUNTER — Other Ambulatory Visit: Payer: Self-pay | Admitting: *Deleted

## 2012-12-22 DIAGNOSIS — I7100 Dissection of unspecified site of aorta: Secondary | ICD-10-CM

## 2012-12-30 ENCOUNTER — Telehealth: Payer: Self-pay | Admitting: General Practice

## 2012-12-30 NOTE — Telephone Encounter (Signed)
Breylen is trying to get into her facility. He is drawing disability check and will bring copies of disability forms by for [place] to fill out and he is suppose to ask for me.

## 2013-01-01 ENCOUNTER — Other Ambulatory Visit: Payer: Self-pay | Admitting: Cardiology

## 2013-01-04 ENCOUNTER — Other Ambulatory Visit: Payer: Self-pay | Admitting: *Deleted

## 2013-01-04 MED ORDER — LISINOPRIL 10 MG PO TABS: ORAL_TABLET | ORAL | Status: DC

## 2013-01-04 MED ORDER — METOPROLOL TARTRATE 100 MG PO TABS: ORAL_TABLET | ORAL | Status: DC

## 2013-01-04 NOTE — Telephone Encounter (Signed)
..   Requested Prescriptions   Pending Prescriptions Disp Refills  . metoprolol (LOPRESSOR) 100 MG tablet [Pharmacy Med Name: METOPROLOL TART 100MG  TAB] 60 tablet 0    Sig: TAKE ONE TABLET BY MOUTH TWICE DAILY  . lisinopril (PRINIVIL,ZESTRIL) 10 MG tablet [Pharmacy Med Name: LISINOPRIL 10MG      TAB] 30 tablet 0    Sig: TAKE ONE TABLET BY MOUTH EVERY DAY

## 2013-01-06 ENCOUNTER — Other Ambulatory Visit: Payer: Medicaid Other

## 2013-01-06 ENCOUNTER — Ambulatory Visit: Payer: Medicaid Other | Admitting: Cardiothoracic Surgery

## 2013-01-10 ENCOUNTER — Other Ambulatory Visit: Payer: Self-pay

## 2013-01-10 DIAGNOSIS — L0390 Cellulitis, unspecified: Secondary | ICD-10-CM

## 2013-01-10 NOTE — Telephone Encounter (Signed)
Patient requesting a refill seen 12/20/12

## 2013-01-11 NOTE — Telephone Encounter (Signed)
Patient needs to be seen if affected areas are worse. I also made dermatology referral on 12/20/12. thx

## 2013-01-11 NOTE — Telephone Encounter (Signed)
Pt aware he will NTBS for refill on ABX.

## 2013-01-19 ENCOUNTER — Encounter: Payer: Self-pay | Admitting: Cardiology

## 2013-01-19 ENCOUNTER — Ambulatory Visit (INDEPENDENT_AMBULATORY_CARE_PROVIDER_SITE_OTHER): Payer: Medicaid Other | Admitting: Cardiology

## 2013-01-19 VITALS — BP 119/70 | HR 71 | Ht 68.0 in | Wt 195.8 lb

## 2013-01-19 DIAGNOSIS — R011 Cardiac murmur, unspecified: Secondary | ICD-10-CM | POA: Diagnosis not present

## 2013-01-19 DIAGNOSIS — I10 Essential (primary) hypertension: Secondary | ICD-10-CM

## 2013-01-19 DIAGNOSIS — I7100 Dissection of unspecified site of aorta: Secondary | ICD-10-CM | POA: Diagnosis not present

## 2013-01-19 MED ORDER — SIMVASTATIN 20 MG PO TABS: 20.0000 mg | ORAL_TABLET | Freq: Every day | ORAL | Status: DC

## 2013-01-19 MED ORDER — METOPROLOL TARTRATE 25 MG PO TABS: 25.0000 mg | ORAL_TABLET | Freq: Two times a day (BID) | ORAL | Status: DC

## 2013-01-19 MED ORDER — LISINOPRIL 10 MG PO TABS: ORAL_TABLET | ORAL | Status: DC

## 2013-01-19 MED ORDER — METOPROLOL TARTRATE 100 MG PO TABS: ORAL_TABLET | ORAL | Status: DC

## 2013-01-19 NOTE — Patient Instructions (Signed)

## 2013-01-19 NOTE — Progress Notes (Signed)
HPI The patient presents for followup of hypertension and his aortic dissection. CT scan done last year demonstrates a stable thoracic repair with continued true and false lumens filling in the descending aorta thoracic aorta.  He denies any chest pressure, neck or arm discomfort. He's had no presyncope or syncope.  He does get rare palpitations.   He has had no PND or orthopnea. He's had no weight gain or edema.  He says that he is active walking daily to a store.  Allergies  Allergen Reactions  . Codeine Nausea And Vomiting    Current Outpatient Prescriptions  Medication Sig Dispense Refill  . aspirin 325 MG tablet Take 325 mg by mouth daily.        . B Complex-C (B-COMPLEX WITH VITAMIN C) tablet Take 1 tablet by mouth daily.      . cephALEXin (KEFLEX) 500 MG capsule Take 1 capsule (500 mg total) by mouth 2 (two) times daily.  20 capsule  0  . hydrOXYzine (ATARAX/VISTARIL) 25 MG tablet Take 1 tablet (25 mg total) by mouth at bedtime as needed for itching.  30 tablet  0  . Iron 66 MG TABS Take 45 mg by mouth.      Marland Kitchen lisinopril (PRINIVIL,ZESTRIL) 10 MG tablet TAKE ONE TABLET BY MOUTH EVERY DAY  30 tablet  0  . metoprolol (LOPRESSOR) 100 MG tablet TAKE ONE TABLET BY MOUTH TWICE DAILY  60 tablet  0  . metoprolol tartrate (LOPRESSOR) 25 MG tablet Take 1 tablet (25 mg total) by mouth 2 (two) times daily.  60 tablet  5  . simvastatin (ZOCOR) 20 MG tablet Take 1 tablet (20 mg total) by mouth at bedtime.  30 tablet  11   No current facility-administered medications for this visit.    Past Medical History  Diagnosis Date  . 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 Hypertension   . Tobacco abuse   . Nephrolithiasis     has history; status post stone removal  . Decreased left ventricular function June 10, 2010    EF of 40-45% w/ out regional wall motion abnormalities    ROS:  Rash on the small of his back  and legs unchanged.  Otherwise as stated in the HPI and negative for all other systems.  PHYSICAL EXAM BP 119/70  Pulse 71  Ht 5\' 8"  (1.727 m)  Wt 195 lb 12.8 oz (88.814 kg)  BMI 29.78 kg/m2 GENERAL:  Well appearing HEENT:  Pupils equal round and reactive, fundi not visualized, oral mucosa unremarkable, poor dentition NECK:  No jugular venous distention, waveform within normal limits, carotid upstroke brisk and symmetric, no bruits, no thyromegaly LUNGS:  Clear to auscultation bilaterally CHEST:  Well healed sternotomy scar and right upper chest pain. HEART:  PMI not displaced or sustained,S1 and S2 within normal limits, no S3, no S4, no clicks, no rubs, brief right upper sternal border murmur ABD:  Flat, positive bowel sounds normal in frequency in pitch, no bruits, no rebound, no guarding, no midline pulsatile mass, no hepatomegaly, no splenomegaly EXT:  2 plus pulses throughout, no edema, no cyanosis no clubbing SKIN:  Small focal red raised and excoriated lesions on the lower back and ankles Bruits is a EKG:  Sinus rhythm, rate 71 axis within normal limits, intervals within normal limits, no acute ST-T wave changes.  01/19/2013  ASSESSMENT AND PLAN  AORTIC DISSECTION:  This  was stable by CT last year.  He is followed by Dr. Tyrone Sage and he will see him next month.   HTN:  Blood pressure control his key and I think it is excellent. He will remain on the meds as listed.  CARDIOMYOPATHY:  His last ejection fraction was 40%. This was in 2011. I would like to repeat an echocardiogram.

## 2013-01-26 ENCOUNTER — Other Ambulatory Visit (INDEPENDENT_AMBULATORY_CARE_PROVIDER_SITE_OTHER): Payer: Medicare Other

## 2013-01-26 ENCOUNTER — Other Ambulatory Visit: Payer: Self-pay

## 2013-01-26 ENCOUNTER — Other Ambulatory Visit: Payer: Self-pay | Admitting: *Deleted

## 2013-01-26 DIAGNOSIS — I7100 Dissection of unspecified site of aorta: Secondary | ICD-10-CM

## 2013-01-26 DIAGNOSIS — L299 Pruritus, unspecified: Secondary | ICD-10-CM

## 2013-01-26 DIAGNOSIS — R011 Cardiac murmur, unspecified: Secondary | ICD-10-CM

## 2013-01-26 DIAGNOSIS — I10 Essential (primary) hypertension: Secondary | ICD-10-CM

## 2013-01-26 MED ORDER — HYDROXYZINE HCL 25 MG PO TABS: 25.0000 mg | ORAL_TABLET | Freq: Every evening | ORAL | Status: DC | PRN

## 2013-02-09 ENCOUNTER — Other Ambulatory Visit: Payer: Self-pay | Admitting: *Deleted

## 2013-02-09 DIAGNOSIS — I7100 Dissection of unspecified site of aorta: Secondary | ICD-10-CM

## 2013-02-10 ENCOUNTER — Ambulatory Visit
Admission: RE | Admit: 2013-02-10 | Discharge: 2013-02-10 | Disposition: A | Discharge status: Home / Self Care | Payer: Medicaid Other | Source: Ambulatory Visit | Attending: Cardiothoracic Surgery | Admitting: Cardiothoracic Surgery

## 2013-02-10 ENCOUNTER — Ambulatory Visit (INDEPENDENT_AMBULATORY_CARE_PROVIDER_SITE_OTHER): Payer: Medicare Other | Admitting: Cardiothoracic Surgery

## 2013-02-10 ENCOUNTER — Ambulatory Visit
Admission: RE | Admit: 2013-02-10 | Discharge: 2013-02-10 | Disposition: A | Discharge status: Home / Self Care | Payer: Medicare Other | Source: Ambulatory Visit | Attending: Cardiothoracic Surgery | Admitting: Cardiothoracic Surgery

## 2013-02-10 ENCOUNTER — Encounter: Payer: Self-pay | Admitting: Cardiothoracic Surgery

## 2013-02-10 VITALS — BP 146/86 | HR 68 | Resp 20 | Ht 68.0 in | Wt 195.0 lb

## 2013-02-10 DIAGNOSIS — I7102 Dissection of abdominal aorta: Secondary | ICD-10-CM | POA: Diagnosis not present

## 2013-02-10 DIAGNOSIS — I712 Thoracic aortic aneurysm, without rupture, unspecified: Secondary | ICD-10-CM

## 2013-02-10 DIAGNOSIS — I7101 Dissection of thoracic aorta: Secondary | ICD-10-CM | POA: Diagnosis not present

## 2013-02-10 DIAGNOSIS — Z8679 Personal history of other diseases of the circulatory system: Secondary | ICD-10-CM

## 2013-02-10 DIAGNOSIS — I7100 Dissection of unspecified site of aorta: Secondary | ICD-10-CM

## 2013-02-10 MED ORDER — IOHEXOL 350 MG/ML SOLN
80.0000 mL | Freq: Once | INTRAVENOUS | Status: AC | PRN
  Administered 2013-02-10: 80 mL via INTRAVENOUS

## 2013-02-10 NOTE — Progress Notes (Signed)
[address]       [place] [address]             [phone]                Nicholas Palmer [facility] Medical Record #[id number] Date of Birth: [birth year]  Referring: Ernestina Penna, MD Primary Care: Rudi Heap, MD  Chief Complaint:    Chief Complaint  Patient presents with  . 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 He continues to see cardiology for control of his blood pressure He denies any chest pain or anginal symptoms. He's had no overt symptoms of congestive heart failure. He continues to be a nonsmoker.     Current Activity/ Functional Status: Patient is independent with mobility/ambulation, transfers, ADL's, IADL's.   Past Medical History  Diagnosis Date  . 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 Hypertension   . Tobacco abuse   . Nephrolithiasis     has history; status post stone removal  . Decreased left ventricular function June 10, 2010    EF of 40-45% w/ out regional wall motion abnormalities    History reviewed. No pertinent past surgical history.  Family History  Problem Relation Age of Onset  . Lung disease Mother   . Kidney disease Sister   two sisters, one had brain aneurysm and was on dialysis died in her 60s His twin brother died of a myocardial infarction at age 70 Father died of myocardial infarction at age 22 Sister died at age 31 of an abdominal aortic aneurysm  History   Social History  . Marital Status: Legally Separated    Spouse Name: N/A    Number of Children: N/A  . Years of Education: N/A   Occupational History  . Not on file.   Social History Main Topics  . Smoking status: Former Smoker    Quit date: 08/11/2002  . Smokeless  tobacco: Not on file  . Alcohol Use: No  . Drug Use: No  . Sexually Active: Not on file   Other Topics Concern  . Not on file   Social History Narrative   Has 20-pack-year history of tobacco abuse, quitting many years ago. Denies alcohol or drugs. Does not routinely exercise     History  Smoking status  . Former Smoker  . Quit date: 08/11/2002  Smokeless tobacco  . Not on file    History  Alcohol Use No     Allergies  Allergen Reactions  . Codeine Nausea And Vomiting    Current Outpatient Prescriptions  Medication Sig Dispense Refill  . aspirin 325 MG tablet Take 325 mg by mouth daily.        . hydrOXYzine (ATARAX/VISTARIL) 25 MG tablet Take 1 tablet (25 mg total) by mouth at bedtime as needed for itching.  30 tablet  2  . lisinopril (PRINIVIL,ZESTRIL) 10 MG tablet TAKE ONE TABLET BY MOUTH EVERY DAY  90 tablet  3  . metoprolol (LOPRESSOR) 100 MG tablet TAKE ONE TABLET BY MOUTH TWICE DAILY  180 tablet  3  . metoprolol tartrate (LOPRESSOR) 25 MG tablet Take 1 tablet (25  mg total) by mouth 2 (two) times daily.  180 tablet  3  . simvastatin (ZOCOR) 20 MG tablet Take 1 tablet (20 mg total) by mouth at bedtime.  90 tablet  3   No current facility-administered medications for this visit.       Review of Systems:     Cardiac Review of Systems: Y or N  Chest Pain [  n ]  Resting SOB [n] Exertional SOB  [n  ]  Orthopnea [n]   Pedal Edema [ n  ]    Palpitations [n  ] Syncope  [  n]   Presyncope [ n ]  General Review of Systems: [Y] = yes [  ]=no Constitional: recent weight change [  ]; anorexia [  ]; fatigue [  ]; nausea [  ]; night sweats [  ]; fever [  ]; or chills [  ];                                                                                                                                          Dental: poor dentition[ y ];  Eye : blurred vision [  ]; diplopia [   ]; vision changes [  ];  Amaurosis fugax[  ]; Resp: cough [  ];  wheezing[ n ];  hemoptysis[ n ];  shortness of breath[ n ]; paroxysmal nocturnal dyspnea[ n ]; dyspnea on exertion[  ]; or orthopnea[  ];  GI:  gallstones[  ], vomiting[  ];  dysphagia[  ]; melena[  ];  hematochezia [  ]; heartburn[  ];   Hx of  Colonoscopy[ no ]; GU: kidney stones [  ]; hematuria[  ];   dysuria [  ];  nocturia[  ];  history of     obstruction [  ];             Skin: rash, swelling[  ];, hair loss[  ];  peripheral edema[  ];  or itching[  ]; Musculosketetal: myalgias[  ];  joint swelling[  ];  joint erythema[  ];  joint pain[  ];  back pain[  ];  Heme/Lymph: bruising[  ];  bleeding[  ];  anemia[  ];  Neuro: TIA[  ];  headaches[  ];  stroke[  ];  vertigo[  ];  seizures[  ];   paresthesias[  ];  difficulty walking[  ];  Psych:depression[  ]; anxiety[  ];  Endocrine: diabetes[  ];  thyroid dysfunction[  ];  Immunizations: Flu [no ]; Pneumococcal[no  ];  Other:  Physical Exam: BP 146/86  Pulse 68  Resp 20  Ht 5\' 8"  (1.727 m)  Wt 195 lb (88.451 kg)  BMI 29.66 kg/m2  SpO2 98%  General appearance: alert, cooperative, appears stated age and no distress Neurologic: intact Heart: regular rate and rhythm, S1, S2 normal, no murmur, click, rub or gallop and normal apical  impulse Lungs: clear to auscultation bilaterally and normal percussion bilaterally Abdomen: soft, non-tender; bowel sounds normal; no masses,  no organomegaly Extremities: extremities normal, atraumatic, no cyanosis or edema and Homans sign is negative, no sign of DVT   Diagnostic Studies & Laboratory data:     Recent Radiology Findings:   Ct Angio Chest W/cm &/or Wo Cm  02/10/2013   *RADIOLOGY REPORT*  Clinical Data:  Follow up aortic dissection. History of type 1 aortic dissection and status post replacement of the ascending thoracic aorta and resuspension of the aortic valve.  CT ANGIOGRAPHY CHEST, ABDOMEN AND PELVIS  Technique:  Multidetector CT imaging through the chest, abdomen and pelvis was performed using the standard protocol during  bolus administration of intravenous contrast.  Multiplanar reconstructed images including MIPs were obtained and reviewed to evaluate the vascular anatomy.  Contrast: 80mL OMNIPAQUE IOHEXOL 350 MG/ML SOLN,  Comparison:  12/25/2011  CTA CHEST  Findings:  Again noted are postsurgical changes consistent with replacement of the ascending thoracic aorta.  The ascending thoracic aorta is patent.  There is a dissection involving the aortic arch that extends in the right innominate artery and terminates at the innominate artery bifurcation. The cephalad extent of the dissection is unchanged.  There is flow in the great vessels.  Posterior aortic arch roughly measures 4.3 cm in diameter and not significantly changed.  There is slightly increased thrombus within the posterior false lumen of the descending thoracic aorta.  The thoracic aorta at the hiatus measures 3.1 cm unchanged.  No significant pericardial or pleural fluid.  A precarinal lymph node roughly measures 0.9 cm in short axis and minimally changed. Overall, there is no significant chest lymphadenopathy.  The trachea and mainstem bronchi are patent.  Stable pleural-based scarring at the posterior right lung base.  A small area of pleural thickening along the right major fissure on image 33, sequence #5. There is a stable 5 mm pleural based nodule in the posterior right upper lobe on image 15, sequence five.  This is probably unchanged since 06/10/2010.   No focal consolidation or parenchymal disease in the lungs.  No acute bony abnormality.   Review of the MIP images confirms the above findings.  IMPRESSION: No significant change in the thoracic aortic dissection.  There is slightly increased mural thrombus within the false lumen.  No significant change in the appearance of the ascending aorta replacement.  Stable pleural based nodules in the lungs as described.  Recommend attention to these on followup imaging.  CTA ABDOMEN AND PELVIS  Findings:  The aortic  dissection extends into the abdominal aorta and terminates in the proximal right external iliac artery and proximal left internal iliac artery.  The configuration and extent of the dissection is unchanged.  There is no significant aneurysmal dilatation of the aorta.  The abdominal aorta roughly measures up to 2.8 cm at the level of the celiac trunk which is unchanged.  The celiac trunk, superior mesenteric artery and both left renal arteries originate from the anterior lumen, which is probably the true lumen.  Dissection extends into the right renal artery. Question a small fenestration of the dissection on image 138.  The IMA originates from the anterior lumen.  Iliac arteries and proximal femoral arteries are patent bilaterally.  Stable low density structure in the left hepatic lobe roughly measures 1.4 cm.  No gross abnormality to the liver, gallbladder, spleen, pancreas or adrenal glands.  4.6 cm cyst involving left kidney upper pole.  There is  a smaller cyst in the left kidney mid pole.  Incidentally, there is a retroaortic left renal vein. Normal appearance of the appendix.  No gross abnormality prostate, seminal vesicles and urinary bladder.  No gross abnormality of the small or large bowel.  No acute bony abnormality.   Review of the MIP images confirms the above findings.  IMPRESSION: Stable appearance of the aortic dissection.  No acute abnormalities within the abdomen or pelvis.  Left renal cysts.   Original Report Authenticated By: Richarda Overlie, M.D.    Recent Lab Findings: Lab Results  Component Value Date   WBC 13.3* 06/14/2010   HGB 9.3* 06/14/2010   HCT 28.6* 06/14/2010   PLT 148* 06/14/2010   GLUCOSE 114* 06/14/2010   CHOL  Value: 88 (NOTE) ATP III Classification:      < 200        mg/dL        Desirable     956 - 239     mg/dL        Borderline High     >= 240        mg/dL        High  21/10/863   TRIG 91 06/14/2010   HDL 26* 06/14/2010   LDLCALC  Value: 44 (NOTE)  Total Cholesterol/HDL  Ratio:CHD Risk                       Coronary Heart Disease Risk Table                                       Men       Women         1/2 Average Risk              3.4        3.3             Average Risk              5.0         4.4         2 X Average Risk              9.6        7.1         3 X Average Risk             23.4       11.0 Use the calculated Patient Ratio above and the CHD Risk table  to determine the patient's CHD Risk. ATP III Classification (LDL):      < 100         mg/dL         Optimal     784 - 129     mg/dL         Near or Above Optimal     130 - 159     mg/dL         Borderline High     160 - 189     mg/dL         High      > 696        mg/dL         Very High  29/12/2839   ALT 18 06/09/2010   AST 18 06/09/2010   NA 139 06/14/2010   K 4.2 06/14/2010   CL  107 06/14/2010   CREATININE 1.60* 06/14/2010   BUN 29* 06/14/2010   CO2 26 06/14/2010   INR 0.82 06/10/2010   ECHO: [facility] [address] [place], [place] [address] [phone]  ------------------------------------------------------------ Transthoracic Echocardiography  Patient: Brodi, Kari MR #: [id number] Study Date: 01/26/2013 Gender: M Age: 53 Height: 172.7cm Weight: 90.7kg BSA: 2.32m^2 Pt. Status: Room:  ATTENDING Corbin Ade, Stanford [place] REFERRING Hochrein PERFORMING Willeen Niece SONOGRAPHER Greggory Stallion, RDCS cc:  ------------------------------------------------------------ LV EF: 60%  ------------------------------------------------------------ History: PMH: Ao v repair and thoracic aortic disection repaired 05-2010 Dyspnea.  ------------------------------------------------------------ Study Conclusions  - Left ventricle: The cavity size was normal. Wall thickness was normal. The estimated ejection fraction was 60%. Wall motion was normal; there were no regional wall motion abnormalities. - Aortic valve: Sclerosis without stenosis.  No significant regurgitation. - Right ventricle: The cavity size was mildly dilated. Systolic function was mildly reduced. - Impressions: I can not tell from the history provided what surgery the patient actually had for the aorta and aortic valve. However, currently the aortic root is of upper normal size. The aortic valve is working well. Impressions:  - I can not tell from the history provided what surgery the patient actually had for the aorta and aortic valve. However, currently the aortic root is of upper normal size. The aortic valve is working well. Transthoracic echocardiography. M-mode, complete 2D, spectral Doppler, and color Doppler. Height: Height: 172.7cm. Height: 68in. Weight: Weight: 90.7kg. Weight: 199.6lb. Body mass index: BMI: 30.4kg/m^2. Body surface area: BSA: 2.22m^2. Blood pressure: 125/75. Patient status: Outpatient.  ------------------------------------------------------------  ------------------------------------------------------------ Left ventricle: The cavity size was normal. Wall thickness was normal. The estimated ejection fraction was 60%. Wall motion was normal; there were no regional wall motion abnormalities.  ------------------------------------------------------------ Aortic valve: Sclerosis without stenosis. Doppler: No significant regurgitation. VTI ratio of LVOT to aortic valve: 0.74. Valve area: 2.34cm^2(VTI). Peak velocity ratio of LVOT to aortic valve: 0.79. Valve area: 2.48cm^2 (Vmax). Mean gradient: 4mm Hg (S).  ------------------------------------------------------------ Aorta: Aortic root: The aortic root was normal in size.  ------------------------------------------------------------ Mitral valve: Structurally normal valve. Leaflet separation was normal. Doppler: Transvalvular velocity was within the normal range. There was no evidence for stenosis. No regurgitation. Peak gradient: 3mm Hg  (D).  ------------------------------------------------------------ Left atrium: The atrium was at the upper limits of normal in size.  ------------------------------------------------------------ Right ventricle: The cavity size was mildly dilated. Systolic function was mildly reduced.  ------------------------------------------------------------ Pulmonic valve: The valve appears to be grossly normal. Doppler: No significant regurgitation.  ------------------------------------------------------------ Tricuspid valve: The valve appears to be grossly normal. Doppler: Mild regurgitation.  ------------------------------------------------------------ Right atrium: The atrium was at the upper limits of normal in size.  ------------------------------------------------------------ Pericardium: There was no pericardial effusion.  ------------------------------------------------------------  2D measurements Normal Doppler measurements Normal Left ventricle LVOT LVID ED, 40.2 mm 43-52 Peak vel, S 102 cm/s ------ chord, VTI, S 23 cm ------ PLAX Aortic valve LVID ES, 30 mm 23-38 Peak vel, S 129 cm/s ------ chord, Mean vel, S 94. cm/s ------ PLAX 1 FS, chord, 25 % >29 VTI, S 30. cm ------ PLAX 9 LVPW, ED 9.84 mm ------ Mean 4 mm ------ IVS/LVPW 1.02 <1.3 gradient, S Hg ratio, ED VTI ratio 0.7 ------ Vol ED, 57 ml ------ LVOT/AV 4 MOD1 Area, VTI 2.3 cm^2 ------ Vol ES, 18 ml ------ 4 MOD1 Peak vel 0.7 ------ EF, MOD1 68 % ------ ratio, 9 Vol index, 28 ml/m^2 ------ LVOT/AV ED, MOD1  Area, Vmax 2.4 cm^2 ------ Vol index, 9 ml/m^2 ------ 8 ES, MOD1 Mitral valve Vol ED, 62 ml ------ Peak E vel 86. cm/s ------ MOD2 4 Vol ES, 20 ml ------ Peak A vel 108 cm/s ------ MOD2 Deceleration 218 ms 150-23 EF, MOD2 68 % ------ time 0 Stroke 42 ml ------ Peak 3 mm ------ vol, MOD2 gradient, D Hg Vol index, 30 ml/m^2 ------ Peak E/A 0.8 ------ ED, MOD2 ratio Vol index, 10 ml/m^2 ------  Tricuspid valve ES, MOD2 Regurg peak 258 cm/s ------ Stroke 20.6 ml/m^2 ------ vel index, Peak RV-RA 27 mm ------ MOD2 gradient, S Hg Ventricular septum Pulmonic valve IVS, ED 10 mm ------ Peak vel, S 128 cm/s ------ LVOT Regurg vel, 61. cm/s ------ Diam, S 20 mm ------ ED 5 Area 3.14 cm^2 ------ Aorta Root diam, 37 mm ------ ED Left atrium AP dim 39 mm ------ AP dim 1.91 cm/m^2 <2.2 index Vol, S 43 ml ------ Vol index, 21.1 ml/m^2 ------ S Right ventricle RVID ED, 32.4 mm 19-38 PLAX  ------------------------------------------------------------ Prepared and Electronically Authenticated by  Willa Rough 2014-06-18T17:34:44.147   Assessment / Plan:     Patient returns today in followup after repair of a descending aorta and resuspension of his aortic valve for acute aortic dissection in 2011. He has a persistent false lumen and the arch and descending thoracic aorta, followup CT scans shows stable situation compared to previous scans. I stressed to him the importance to continue with good blood pressure control, including beta blocker which he appears to be doing. Follow up ECHO shows improved EF to 60 % no AI We'll plan to see him back in one year with a repeat CTA of the chest.       Delight Ovens MD  Beeper [phone] Office [phone] 02/10/2013 10:42 AM

## 2013-05-01 ENCOUNTER — Other Ambulatory Visit: Payer: Self-pay | Admitting: Cardiology

## 2013-06-15 ENCOUNTER — Other Ambulatory Visit: Payer: Self-pay

## 2013-06-15 DIAGNOSIS — L299 Pruritus, unspecified: Secondary | ICD-10-CM

## 2013-06-15 NOTE — Telephone Encounter (Signed)
Last seen 12/20/12 Nicholas Palmer

## 2013-06-16 MED ORDER — HYDROXYZINE HCL 25 MG PO TABS: 25.0000 mg | ORAL_TABLET | Freq: Every evening | ORAL | Status: DC | PRN

## 2013-07-18 ENCOUNTER — Other Ambulatory Visit: Payer: Self-pay | Admitting: General Practice

## 2013-07-20 NOTE — Telephone Encounter (Signed)
Last seen 05/14 

## 2013-12-29 ENCOUNTER — Other Ambulatory Visit: Payer: Self-pay

## 2013-12-29 DIAGNOSIS — I10 Essential (primary) hypertension: Secondary | ICD-10-CM

## 2013-12-29 MED ORDER — METOPROLOL TARTRATE 25 MG PO TABS: 25.0000 mg | ORAL_TABLET | Freq: Two times a day (BID) | ORAL | Status: DC

## 2013-12-29 MED ORDER — METOPROLOL TARTRATE 100 MG PO TABS: ORAL_TABLET | ORAL | Status: DC

## 2013-12-29 MED ORDER — LISINOPRIL 10 MG PO TABS: ORAL_TABLET | ORAL | Status: DC

## 2014-01-30 ENCOUNTER — Other Ambulatory Visit: Payer: Self-pay

## 2014-01-30 DIAGNOSIS — I71019 Dissection of thoracic aorta, unspecified: Secondary | ICD-10-CM

## 2014-01-30 DIAGNOSIS — I7101 Dissection of thoracic aorta: Secondary | ICD-10-CM

## 2014-02-01 ENCOUNTER — Encounter: Payer: Self-pay | Admitting: Cardiology

## 2014-02-01 ENCOUNTER — Ambulatory Visit (INDEPENDENT_AMBULATORY_CARE_PROVIDER_SITE_OTHER): Payer: Medicare Other | Admitting: Cardiology

## 2014-02-01 VITALS — BP 124/78 | HR 80 | Ht 69.0 in | Wt 184.0 lb

## 2014-02-01 DIAGNOSIS — Z79899 Other long term (current) drug therapy: Secondary | ICD-10-CM | POA: Diagnosis not present

## 2014-02-01 DIAGNOSIS — I10 Essential (primary) hypertension: Secondary | ICD-10-CM

## 2014-02-01 DIAGNOSIS — I7100 Dissection of unspecified site of aorta: Secondary | ICD-10-CM | POA: Diagnosis not present

## 2014-02-01 DIAGNOSIS — E785 Hyperlipidemia, unspecified: Secondary | ICD-10-CM | POA: Diagnosis not present

## 2014-02-01 MED ORDER — METOPROLOL TARTRATE 100 MG PO TABS: ORAL_TABLET | ORAL | Status: DC

## 2014-02-01 MED ORDER — LISINOPRIL 10 MG PO TABS: ORAL_TABLET | ORAL | Status: DC

## 2014-02-01 MED ORDER — SIMVASTATIN 20 MG PO TABS: ORAL_TABLET | ORAL | Status: DC

## 2014-02-01 MED ORDER — METOPROLOL TARTRATE 25 MG PO TABS: 25.0000 mg | ORAL_TABLET | Freq: Two times a day (BID) | ORAL | Status: DC

## 2014-02-01 NOTE — Progress Notes (Signed)
HPI The patient presents for followup of hypertension and his aortic dissection. CT scan done last year demonstrates a stable thoracic repair.  He denies any chest pressure, neck or arm discomfort. He's had no presyncope or syncope.  He does get rare palpitations.   He has had no PND or orthopnea. He's had no weight gain or edema.  He does walk without complaints.  He has some mild chronic dyspnea.  Of note echo last year he had no significant aortic valvular abnormalities.  His EF was 60%.    Allergies  Allergen Reactions  . Codeine Nausea And Vomiting    Current Outpatient Prescriptions  Medication Sig Dispense Refill  . aspirin 325 MG tablet Take 325 mg by mouth daily.        Marland Kitchen. lisinopril (PRINIVIL,ZESTRIL) 10 MG tablet TAKE ONE TABLET BY MOUTH EVERY DAY  90 tablet  0  . metoprolol (LOPRESSOR) 100 MG tablet TAKE ONE TABLET BY MOUTH TWICE DAILY  180 tablet  0  . metoprolol tartrate (LOPRESSOR) 25 MG tablet Take 1 tablet (25 mg total) by mouth 2 (two) times daily.  180 tablet  0  . simvastatin (ZOCOR) 20 MG tablet TAKE ONE TABLET BY MOUTH AT BEDTIME  30 tablet  10  . hydrOXYzine (ATARAX/VISTARIL) 25 MG tablet TAKE 1 TABLET (25 MG TOTAL) BY MOUTH AT BEDTIME AS NEEDED FOR ITCHING.  30 tablet  1   No current facility-administered medications for this visit.    Past Medical History  Diagnosis Date  . 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 Hypertension   . Tobacco abuse   . Nephrolithiasis     has history; status post stone removal  . Decreased left ventricular function June 10, 2010    EF of 40-45% w/ out regional wall motion abnormalities.  EF 55% echo 2014    ROS:  Rash on the small of his back and legs unchanged.  Otherwise as stated in the HPI and negative for all other systems.  PHYSICAL EXAM BP 124/78  Pulse 80  Ht 5\' 9"  (1.753 m)  Wt 184 lb (83.462 kg)  BMI 27.16 kg/m2 GENERAL:  Well  appearing HEENT:  Left corneal opacification, fundi not visualized, oral mucosa unremarkable, poor dentition NECK:  No jugular venous distention, waveform within normal limits, carotid upstroke brisk and symmetric, no bruits, no thyromegaly LUNGS:  Clear to auscultation bilaterally CHEST:  Well healed sternotomy scar and right upper chest pain. HEART:  PMI not displaced or sustained,S1 and S2 within normal limits, no S3, no S4, no clicks, no rubs, brief right upper sternal border murmur, no diastolic murmur ABD:  Flat, positive bowel sounds normal in frequency in pitch, no bruits, no rebound, no guarding, no midline pulsatile mass, no hepatomegaly, no splenomegaly EXT:  2 plus pulses throughout, no edema, no cyanosis no clubbing SKIN:  Small focal red raised and excoriated lesions on the lower back and ankles less pronounced than previous.     EKG:  Sinus rhythm, rate 76 axis within normal limits, intervals within normal limits, no acute ST-T wave changes.  02/01/2014  ASSESSMENT AND PLAN  AORTIC DISSECTION REPAIRED:  This has been stable and he is scheduled for followup CTA of his chest in August and followup with Dr. Tyrone SageGerhardt.   HTN:  He checks his blood pressure twice daily. He has excellent control. We'll continue the meds as listed.  CARDIOMYOPATHY:  His last ejection fraction was 40%. This was in 2011. However it was improved to 60% and the echo last year. I would suspect that change and as I would not suspect any aortic valve abnormalities from his exam. No echo is planned at this time. I will follow this clinically.  DYSLIPIDEMIA:  I will check a lipid profile and liver enzymes.

## 2014-02-01 NOTE — Patient Instructions (Signed)
The current medical regimen is effective;  continue present plan and medications.  Please return to [facility] fasting to have bloodwork. (lipid and liver profile)  Follow up in 1 year with Dr Antoine PocheHochrein.  You will receive a letter in the mail 2 months before you are due.  Please call us when you receive this letter to schedule your follow up appointment.

## 2014-03-16 ENCOUNTER — Other Ambulatory Visit: Payer: Medicare Other

## 2014-03-16 ENCOUNTER — Ambulatory Visit: Payer: Medicare Other | Admitting: Cardiothoracic Surgery

## 2014-04-19 ENCOUNTER — Other Ambulatory Visit: Payer: Self-pay | Admitting: *Deleted

## 2014-04-19 DIAGNOSIS — I7100 Dissection of unspecified site of aorta: Secondary | ICD-10-CM

## 2014-04-20 ENCOUNTER — Ambulatory Visit (INDEPENDENT_AMBULATORY_CARE_PROVIDER_SITE_OTHER): Payer: Medicare Other | Admitting: Cardiothoracic Surgery

## 2014-04-20 ENCOUNTER — Other Ambulatory Visit: Payer: Self-pay | Admitting: Cardiothoracic Surgery

## 2014-04-20 ENCOUNTER — Ambulatory Visit
Admission: RE | Admit: 2014-04-20 | Discharge: 2014-04-20 | Disposition: A | Discharge status: Home / Self Care | Payer: Medicare Other | Source: Ambulatory Visit | Attending: Cardiothoracic Surgery | Admitting: Cardiothoracic Surgery

## 2014-04-20 ENCOUNTER — Encounter: Payer: Self-pay | Admitting: Cardiothoracic Surgery

## 2014-04-20 VITALS — BP 130/77 | HR 76 | Resp 20 | Ht 69.0 in | Wt 184.0 lb

## 2014-04-20 DIAGNOSIS — I7101 Dissection of thoracic aorta: Secondary | ICD-10-CM

## 2014-04-20 DIAGNOSIS — K7689 Other specified diseases of liver: Secondary | ICD-10-CM | POA: Diagnosis not present

## 2014-04-20 DIAGNOSIS — I712 Thoracic aortic aneurysm, without rupture, unspecified: Secondary | ICD-10-CM | POA: Diagnosis not present

## 2014-04-20 DIAGNOSIS — I7100 Dissection of unspecified site of aorta: Secondary | ICD-10-CM | POA: Diagnosis not present

## 2014-04-20 DIAGNOSIS — Z8679 Personal history of other diseases of the circulatory system: Secondary | ICD-10-CM

## 2014-04-20 DIAGNOSIS — I71019 Dissection of thoracic aorta, unspecified: Secondary | ICD-10-CM

## 2014-04-20 LAB — CREATININE, SERUM: Creat: 1.1 mg/dL (ref 0.50–1.35)

## 2014-04-20 LAB — BUN: BUN: 24 mg/dL — ABNORMAL HIGH (ref 6–23)

## 2014-04-20 MED ORDER — IOHEXOL 350 MG/ML SOLN
80.0000 mL | Freq: Once | INTRAVENOUS | Status: AC | PRN
  Administered 2014-04-20: 80 mL via INTRAVENOUS

## 2014-04-20 NOTE — Progress Notes (Signed)
[address]       [place] [address]             [phone]                Nicholas Palmer [facility] Medical Record #[id number] Date of Birth: [birth year]  Referring: Dr. Antoine Poche Primary Care: Rudi Heap, MD  Chief Complaint:    Chief Complaint  Patient presents with  . 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 He continues to see cardiology for control of his blood pressure He denies any chest pain or anginal symptoms. He's had no overt symptoms of congestive heart failure. He continues to be a nonsmoker.   Patient notes that he is done much better with his blood pressure control recently.   Current Activity/ Functional Status: Patient is independent with mobility/ambulation, transfers, ADL's, IADL's.   Past Medical History  Diagnosis Date  . 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 Hypertension   . Tobacco abuse   . Nephrolithiasis     has history; status post stone removal  . Decreased left ventricular function June 10, 2010    EF of 40-45% w/ out regional wall motion abnormalities 2011  EF 60%echo 2014    Past Surgical History  Procedure Laterality Date  . Repair of acute ascending thoracic aortic dissection      Aortic root replacement with resuspension of the aortic valve.  06/10/10 Dr. Tyrone Sage    Family History  Problem Relation Age of Onset  . Lung disease Mother   . Kidney disease Sister   two sisters, one had brain aneurysm and was on dialysis died in her 63s His twin brother died of a myocardial infarction at age 63 Father died of myocardial infarction at age 60 Sister died at age 39 of an abdominal aortic aneurysm  History   Social History    . Marital Status: Legally Separated    Spouse Name: N/A    Number of Children: N/A  . Years of Education: N/A   Occupational History  . Not on file.   Social History Main Topics  . Smoking status: Former Smoker    Quit date: 08/11/2002  . Smokeless tobacco: Not on file  . Alcohol Use: No  . Drug Use: No  . Sexual Activity: Not on file   Other Topics Concern  . Not on file   Social History Narrative   Has 20-pack-year history of tobacco abuse, quitting many years ago. Denies alcohol or drugs. Does not routinely exercise     History  Smoking status  . Former Smoker  . Quit date: 08/11/2002  Smokeless tobacco  . Not on file    History  Alcohol Use No     Allergies  Allergen Reactions  . Codeine Nausea And Vomiting    Current Outpatient Prescriptions  Medication Sig Dispense Refill  . aspirin 325 MG tablet Take 325 mg by mouth daily.        Marland Kitchen lisinopril (PRINIVIL,ZESTRIL) 10 MG tablet TAKE ONE TABLET BY MOUTH EVERY DAY  90 tablet  3  . metoprolol (LOPRESSOR) 100 MG tablet TAKE ONE TABLET  BY MOUTH TWICE DAILY  180 tablet  3  . metoprolol tartrate (LOPRESSOR) 25 MG tablet Take 1 tablet (25 mg total) by mouth 2 (two) times daily.  180 tablet  3  . simvastatin (ZOCOR) 20 MG tablet TAKE ONE TABLET BY MOUTH AT BEDTIME  90 tablet  3   No current facility-administered medications for this visit.       Review of Systems:     Cardiac Review of Systems: Y or N  Chest Pain [  n ]  Resting SOB [n] Exertional SOB  [n  ]  Orthopnea [n]   Pedal Edema [ n  ]    Palpitations [n  ] Syncope  [  n]   Presyncope [ n ]  General Review of Systems: [Y] = yes [  ]=no Constitional: recent weight change [  ]; anorexia [  ]; fatigue [  ]; nausea [  ]; night sweats [  ]; fever [  ]; or chills [  ];                                                                                                                                          Dental: poor dentition[ y ];  Eye : blurred vision [   ]; diplopia [   ]; vision changes [  ];  Amaurosis fugax[  ]; Resp: cough [  ];  wheezing[ n ];  hemoptysis[ n ]; shortness of breath[ n ]; paroxysmal nocturnal dyspnea[ n ]; dyspnea on exertion[  ]; or orthopnea[  ];  GI:  gallstones[  ], vomiting[  ];  dysphagia[  ]; melena[  ];  hematochezia [  ]; heartburn[  ];   Hx of  Colonoscopy[ no ]; GU: kidney stones [  ]; hematuria[  ];   dysuria [  ];  nocturia[  ];  history of     obstruction [  ];             Skin: rash, swelling[  ];, hair loss[  ];  peripheral edema[  ];  or itching[  ]; Musculosketetal: myalgias[  ];  joint swelling[  ];  joint erythema[  ];  joint pain[  ];  back pain[  ];  Heme/Lymph: bruising[  ];  bleeding[  ];  anemia[  ];  Neuro: TIA[  ];  headaches[  ];  stroke[  ];  vertigo[  ];  seizures[  ];   paresthesias[  ];  difficulty walking[  ];  Psych:depression[  ]; anxiety[  ];  Endocrine: diabetes[  ];  thyroid dysfunction[  ];  Immunizations: Flu [no ]; Pneumococcal[no  ];  Other:  Physical Exam: BP 130/77  Pulse 76  Resp 20  Ht  (1.753 m)  Wt 184 lb (83.462 kg)  BMI 27.16 kg/m2  SpO2 97%  General appearance: alert, cooperative, appears stated age and no distress Neurologic:  intact Heart: regular rate and rhythm, S1, S2 normal, no murmur I do not appreciate any murmur of aortic insufficiency, click, rub or gallop and normal apical impulse Lungs: clear to auscultation bilaterally and normal percussion bilaterally Abdomen: soft, non-tender; bowel sounds normal; no masses,  no organomegaly Extremities: extremities normal, atraumatic, no cyanosis or edema and Homans sign is negative, no sign of DVT Patient has full and equal radial brachial and pedal pulses  Diagnostic Studies & Laboratory data:     Recent Radiology Findings:  Ct Angio Chest Aorta W/cm &/or Wo/cm  04/20/2014   CLINICAL DATA:  58 year old male with a history of previous type A aortic dissection. Surgical repair was October 2011.  EXAM: CTA  ABDOMEN AND PELVIS wITHOUT AND WITH CONTRAST  TECHNIQUE: Multidetector CT imaging of the abdomen and pelvis was performed using the standard protocol during bolus administration of intravenous contrast. Multiplanar reconstructed images and MIPs were obtained and reviewed to evaluate the vascular anatomy.  CONTRAST:  80mL OMNIPAQUE IOHEXOL 350 MG/ML SOLN  COMPARISON:  Multiple prior CT for comparison. Most recent CT 02/10/2013  FINDINGS: Nonvascular:  Chest:  Unremarkable appearance of the soft tissues of the chest. No supraclavicular or axillary adenopathy. Surgical changes of prior median sternotomy. Surgical changes also present within the right supraclavicular region.  Unremarkable appearance of the visualized thyroid.  Central airways patent.  No mediastinal adenopathy.  Heart size within normal limits.  Surgical changes of previous type a aortic dissection.  No confluent airspace disease or pneumothorax. No pleural effusion. Minimal atelectasis at the bilateral bases. No pulmonary nodules identified.  Abdomen/pelvis:  Low-density lesion within segment 4 a (image 92 series 4), which is larger than on the comparison CT, and in fact has enlarged to double the size of the lesion on the first available comparison 12/14/2006. In 2008 lesion measured approximately 8 mm and is now 16 mm.  There are additional small hypodensities measuring a few mm in segment 4B (image 119) as well as in segment 3 (image 108).  No intrahepatic or extrahepatic biliary ductal dilatation.  No evidence of radiopaque gallstones with no pericholecystic fluid or inflammatory changes. No peripancreatic fluid or inflammatory changes.  Unremarkable appearance of the bilateral adrenal glands. Unremarkable appearance of the spleen.  No abnormally distended small bowel or colon. Normal appendix. There are a few colonic diverticula with no evidence of associated inflammatory changes.  Unremarkable appearance of the right kidney with no hydronephrosis  or nephrolithiasis. Unremarkable right ureter. No left-sided hydronephrosis or nephrolithiasis. Exophytic cyst of the left kidney measures 5.2 cm, unchanged from prior. Additional small cyst interpolar region measures 18 mm.  No peritoneal lymphadenopathy.  Small portacaval lymph node.  Unremarkable appearance of the urinary bladder. Transverse diameter of prostate measures nearly 5 cm.  Vascular:  Aorta:  Surgical changes of prior type a aortic dissection repair. As was demonstrated on prior there is persisting dissection flap extending from the aortic root repair. The flap again is seen to extend through the aortic arch and the length of descending aorta through the bifurcation.  Greatest diameter of the transverse arch measures 4.2 cm, unchanged from prior.  Again the dissection flap extends into the innominate artery. The innominate artery is perfused both by the true lumen and false lumen. The left common carotid artery and left subclavian artery are perfused from the true lumen.  True lumen perfuses the celiac artery superior mesenteric artery, and inferior mesenteric artery.  Dissection flap extends into the origin of the right renal  artery which is perfused by both lumen and remains patent.  Two separate left renal artery, each appear perfused via the true lumen.  The iliac arteries:  The dissection flap extends through the aortic bifurcation into the bilateral common iliac arteries. On the right, the dissection flap terminates at the origin of the hypogastric artery. Hypogastric artery appears perfused via the true lumen, although the flap may extend into the epigastric artery.  On the left the dissection flap extends into the hypogastric artery which remains perfused. The left external iliac artery is of normal caliber and remains perfused.  Venous:  Unremarkable appearance of the portal venous system. Retro aortic left renal vein.  Review of the MIP images confirms the above findings.  IMPRESSION:  Re-demonstration of surgically repaired type A aortic dissection. The greatest diameter of the aorta and configuration of the dissection flap are unchanged, with flap extending from the aortic root repair into the bilateral iliac arteries, as above. Perfusion is maintained within the branch vessels of the aortic arch and of the mesenteric vessels.  Low-density lesion of the left hepatic lobe has slowly increased in size from first comparison CT in 2008. Further evaluation with a liver protocol MRI may be warranted, particularly if the patient has a history of a known malignancy.  These results will be called to the ordering clinician or representative by the Radiologist Assistant, and communication documented in the PACS or zVision Dashboard.  Signed,  Yvone Neu. Loreta Ave, DO  [facility]  [facility]   Electronically Signed   By: Gilmer Mor O.D.   On: 04/20/2014 15:37      Ct Angio Chest W/cm &/or Wo Cm  02/10/2013   *RADIOLOGY REPORT*  Clinical Data:  Follow up aortic dissection. History of type 1 aortic dissection and status post replacement of the ascending thoracic aorta and resuspension of the aortic valve.  CT ANGIOGRAPHY CHEST, ABDOMEN AND PELVIS  Technique:  Multidetector CT imaging through the chest, abdomen and pelvis was performed using the standard protocol during bolus administration of intravenous contrast.  Multiplanar reconstructed images including MIPs were obtained and reviewed to evaluate the vascular anatomy.  Contrast: 80mL OMNIPAQUE IOHEXOL 350 MG/ML SOLN,  Comparison:  12/25/2011  CTA CHEST  Findings:  Again noted are postsurgical changes consistent with replacement of the ascending thoracic aorta.  The ascending thoracic aorta is patent.  There is a dissection involving the aortic arch that extends in the right innominate artery and terminates at the innominate artery bifurcation. The cephalad extent of the dissection is unchanged.  There is  flow in the great vessels.  Posterior aortic arch roughly measures 4.3 cm in diameter and not significantly changed.  There is slightly increased thrombus within the posterior false lumen of the descending thoracic aorta.  The thoracic aorta at the hiatus measures 3.1 cm unchanged.  No significant pericardial or pleural fluid.  A precarinal lymph node roughly measures 0.9 cm in short axis and minimally changed. Overall, there is no significant chest lymphadenopathy.  The trachea and mainstem bronchi are patent.  Stable pleural-based scarring at the posterior right lung base.  A small area of pleural thickening along the right major fissure on image 33, sequence #5. There is a stable 5 mm pleural based nodule in the posterior right upper lobe on image 15, sequence five.  This is probably unchanged since 06/10/2010.   No focal consolidation or parenchymal disease in the lungs.  No acute bony abnormality.   Review  of the MIP images confirms the above findings.  IMPRESSION: No significant change in the thoracic aortic dissection.  There is slightly increased mural thrombus within the false lumen.  No significant change in the appearance of the ascending aorta replacement.  Stable pleural based nodules in the lungs as described.  Recommend attention to these on followup imaging.  CTA ABDOMEN AND PELVIS  Findings:  The aortic dissection extends into the abdominal aorta and terminates in the proximal right external iliac artery and proximal left internal iliac artery.  The configuration and extent of the dissection is unchanged.  There is no significant aneurysmal dilatation of the aorta.  The abdominal aorta roughly measures up to 2.8 cm at the level of the celiac trunk which is unchanged.  The celiac trunk, superior mesenteric artery and both left renal arteries originate from the anterior lumen, which is probably the true lumen.  Dissection extends into the right renal artery. Question a small fenestration of the  dissection on image 138.  The IMA originates from the anterior lumen.  Iliac arteries and proximal femoral arteries are patent bilaterally.  Stable low density structure in the left hepatic lobe roughly measures 1.4 cm.  No gross abnormality to the liver, gallbladder, spleen, pancreas or adrenal glands.  4.6 cm cyst involving left kidney upper pole.  There is a smaller cyst in the left kidney mid pole.  Incidentally, there is a retroaortic left renal vein. Normal appearance of the appendix.  No gross abnormality prostate, seminal vesicles and urinary bladder.  No gross abnormality of the small or large bowel.  No acute bony abnormality.   Review of the MIP images confirms the above findings.  IMPRESSION: Stable appearance of the aortic dissection.  No acute abnormalities within the abdomen or pelvis.  Left renal cysts.   Original Report Authenticated By: Richarda Overlie, M.D.    Recent Lab Findings: Lab Results  Component Value Date   WBC 13.3* 06/14/2010   HGB 9.3* 06/14/2010   HCT 28.6* 06/14/2010   PLT 148* 06/14/2010   GLUCOSE 114* 06/14/2010   CHOL  Value: 88 (NOTE) ATP III Classification:      < 200        mg/dL        Desirable     308 - 239     mg/dL        Borderline High     >= 240        mg/dL        High  65/02/8468   TRIG 91 06/14/2010   HDL 26* 06/14/2010   LDLCALC  Value: 44 (NOTE)  Total Cholesterol/HDL Ratio:CHD Risk                       Coronary Heart Disease Risk Table                                       Men       Women         1/2 Average Risk              3.4        3.3             Average Risk              5.0         4.4  2 X Average Risk              9.6        7.1         3 X Average Risk             23.4       11.0 Use the calculated Patient Ratio above and the CHD Risk table  to determine the patient's CHD Risk. ATP III Classification (LDL):      < 100         mg/dL         Optimal     213 - 129     mg/dL         Near or Above Optimal     130 - 159     mg/dL         Borderline  High     160 - 189     mg/dL         High      > 086        mg/dL         Very High  57/03/4695   ALT 18 06/09/2010   AST 18 06/09/2010   NA 139 06/14/2010   K 4.2 06/14/2010   CL 107 06/14/2010   CREATININE 1.10 04/20/2014   BUN 24* 04/20/2014   CO2 26 06/14/2010   INR 0.82 06/10/2010   ECHO: [facility] [address] [place], [place] [address] [phone]  ------------------------------------------------------------ Transthoracic Echocardiography  Patient: Zarin, Knupp MR #: [id number] Study Date: 01/26/2013 Gender: M Age: 22 Height: 172.7cm Weight: 90.7kg BSA: 2.49m^2 Pt. Status: Room:  ATTENDING Corbin Ade, Stanford [place] REFERRING Hochrein PERFORMING Willeen Niece SONOGRAPHER Greggory Stallion, RDCS cc:  ------------------------------------------------------------ LV EF: 60%  ------------------------------------------------------------ History: PMH: Ao v repair and thoracic aortic disection repaired 05-2010 Dyspnea.  ------------------------------------------------------------ Study Conclusions  - Left ventricle: The cavity size was normal. Wall thickness was normal. The estimated ejection fraction was 60%. Wall motion was normal; there were no regional wall motion abnormalities. - Aortic valve: Sclerosis without stenosis. No significant regurgitation. - Right ventricle: The cavity size was mildly dilated. Systolic function was mildly reduced. - Impressions: I can not tell from the history provided what surgery the patient actually had for the aorta and aortic valve. However, currently the aortic root is of upper normal size. The aortic valve is working well. Impressions:  - I can not tell from the history provided what surgery the patient actually had for the aorta and aortic valve. However, currently the aortic root is of upper normal size. The aortic valve is working well. Transthoracic  echocardiography. M-mode, complete 2D, spectral Doppler, and color Doppler. Height: Height: 172.7cm. Height: 68in. Weight: Weight: 90.7kg. Weight: 199.6lb. Body mass index: BMI: 30.4kg/m^2. Body surface area: BSA: 2.55m^2. Blood pressure: 125/75. Patient status: Outpatient.  ------------------------------------------------------------  ------------------------------------------------------------ Left ventricle: The cavity size was normal. Wall thickness was normal. The estimated ejection fraction was 60%. Wall motion was normal; there were no regional wall motion abnormalities.  ------------------------------------------------------------ Aortic valve: Sclerosis without stenosis. Doppler: No significant regurgitation. VTI ratio of LVOT to aortic valve: 0.74. Valve area: 2.34cm^2(VTI). Peak velocity ratio of LVOT to aortic valve: 0.79. Valve area: 2.48cm^2 (Vmax). Mean gradient: 4mm Hg (S).  ------------------------------------------------------------ Aorta: Aortic root: The aortic root was normal in size.  ------------------------------------------------------------ Mitral valve: Structurally normal valve. Leaflet separation was normal. Doppler: Transvalvular velocity was within  the normal range. There was no evidence for stenosis. No regurgitation. Peak gradient: 3mm Hg (D).  ------------------------------------------------------------ Left atrium: The atrium was at the upper limits of normal in size.  ------------------------------------------------------------ Right ventricle: The cavity size was mildly dilated. Systolic function was mildly reduced.  ------------------------------------------------------------ Pulmonic valve: The valve appears to be grossly normal. Doppler: No significant regurgitation.  ------------------------------------------------------------ Tricuspid valve: The valve appears to be grossly normal. Doppler: Mild  regurgitation.  ------------------------------------------------------------ Right atrium: The atrium was at the upper limits of normal in size.  ------------------------------------------------------------ Pericardium: There was no pericardial effusion.  ------------------------------------------------------------  2D measurements Normal Doppler measurements Normal Left ventricle LVOT LVID ED, 40.2 mm 43-52 Peak vel, S 102 cm/s ------ chord, VTI, S 23 cm ------ PLAX Aortic valve LVID ES, 30 mm 23-38 Peak vel, S 129 cm/s ------ chord, Mean vel, S 94. cm/s ------ PLAX 1 FS, chord, 25 % >29 VTI, S 30. cm ------ PLAX 9 LVPW, ED 9.84 mm ------ Mean 4 mm ------ IVS/LVPW 1.02 <1.3 gradient, S Hg ratio, ED VTI ratio 0.7 ------ Vol ED, 57 ml ------ LVOT/AV 4 MOD1 Area, VTI 2.3 cm^2 ------ Vol ES, 18 ml ------ 4 MOD1 Peak vel 0.7 ------ EF, MOD1 68 % ------ ratio, 9 Vol index, 28 ml/m^2 ------ LVOT/AV ED, MOD1 Area, Vmax 2.4 cm^2 ------ Vol index, 9 ml/m^2 ------ 8 ES, MOD1 Mitral valve Vol ED, 62 ml ------ Peak E vel 86. cm/s ------ MOD2 4 Vol ES, 20 ml ------ Peak A vel 108 cm/s ------ MOD2 Deceleration 218 ms 150-23 EF, MOD2 68 % ------ time 0 Stroke 42 ml ------ Peak 3 mm ------ vol, MOD2 gradient, D Hg Vol index, 30 ml/m^2 ------ Peak E/A 0.8 ------ ED, MOD2 ratio Vol index, 10 ml/m^2 ------ Tricuspid valve ES, MOD2 Regurg peak 258 cm/s ------ Stroke 20.6 ml/m^2 ------ vel index, Peak RV-RA 27 mm ------ MOD2 gradient, S Hg Ventricular septum Pulmonic valve IVS, ED 10 mm ------ Peak vel, S 128 cm/s ------ LVOT Regurg vel, 61. cm/s ------ Diam, S 20 mm ------ ED 5 Area 3.14 cm^2 ------ Aorta Root diam, 37 mm ------ ED Left atrium AP dim 39 mm ------ AP dim 1.91 cm/m^2 <2.2 index Vol, S 43 ml ------ Vol index, 21.1 ml/m^2 ------ S Right ventricle RVID ED, 32.4 mm 19-38 PLAX  ------------------------------------------------------------ Prepared and  Electronically Authenticated by  Willa Rough 2014-06-18T17:34:44.147   Assessment / Plan:     Patient returns today in followup after repair of ascending aorta dissection and resuspension of his aortic valve for acute aortic dissection in October 2011. He has a persistent false lumen and the arch and descending thoracic aorta, followup CT scans shows stable situation compared to previous scans. I stressed to him the importance to continue with good blood pressure control, including beta blocker which he appears to be doing.  We'll plan to see him back in one year with a repeat CTA of the chest.       Delight Ovens MD  Beeper [phone] Office [phone] 04/20/2014 3:23 PM

## 2015-03-09 ENCOUNTER — Other Ambulatory Visit: Payer: Self-pay

## 2015-03-09 ENCOUNTER — Telehealth: Payer: Self-pay | Admitting: Cardiology

## 2015-03-09 MED ORDER — METOPROLOL TARTRATE 100 MG PO TABS: ORAL_TABLET | ORAL | Status: DC

## 2015-03-09 MED ORDER — SIMVASTATIN 20 MG PO TABS: ORAL_TABLET | ORAL | Status: DC

## 2015-03-09 MED ORDER — LISINOPRIL 10 MG PO TABS: ORAL_TABLET | ORAL | Status: DC

## 2015-03-09 NOTE — Telephone Encounter (Signed)
New message      Refill lisinopril, metoprolol  and  and simvastatin.  Pt needs a 90day supply.  Please call it in to [facility] in [place].

## 2015-03-10 ENCOUNTER — Other Ambulatory Visit: Payer: Self-pay | Admitting: Cardiology

## 2015-03-13 ENCOUNTER — Other Ambulatory Visit: Payer: Self-pay | Admitting: Cardiology

## 2015-03-13 NOTE — Telephone Encounter (Signed)
REFILL 

## 2015-04-03 ENCOUNTER — Ambulatory Visit (INDEPENDENT_AMBULATORY_CARE_PROVIDER_SITE_OTHER): Payer: Medicare Other | Admitting: Family Medicine

## 2015-04-03 ENCOUNTER — Telehealth: Payer: Self-pay | Admitting: Family Medicine

## 2015-04-03 ENCOUNTER — Encounter: Payer: Self-pay | Admitting: Family Medicine

## 2015-04-03 VITALS — BP 121/78 | HR 75 | Temp 98.1°F | Ht 69.0 in | Wt 194.6 lb

## 2015-04-03 DIAGNOSIS — L409 Psoriasis, unspecified: Secondary | ICD-10-CM | POA: Diagnosis not present

## 2015-04-03 DIAGNOSIS — I10 Essential (primary) hypertension: Secondary | ICD-10-CM

## 2015-04-03 DIAGNOSIS — R319 Hematuria, unspecified: Secondary | ICD-10-CM | POA: Insufficient documentation

## 2015-04-03 LAB — POCT URINALYSIS DIPSTICK
Bilirubin, UA: NEGATIVE
GLUCOSE UA: NEGATIVE
Ketones, UA: NEGATIVE
Leukocytes, UA: NEGATIVE
Nitrite, UA: NEGATIVE
Protein, UA: NEGATIVE
Spec Grav, UA: 1.025
UROBILINOGEN UA: NEGATIVE
pH, UA: 6

## 2015-04-03 LAB — POCT UA - MICROSCOPIC ONLY
Bacteria, U Microscopic: NEGATIVE
Casts, Ur, LPF, POC: NEGATIVE
Mucus, UA: NEGATIVE
WBC, UR, HPF, POC: NEGATIVE
Yeast, UA: NEGATIVE

## 2015-04-03 MED ORDER — TRIAMCINOLONE ACETONIDE 0.5 % EX OINT: 1.0000 "application " | TOPICAL_OINTMENT | Freq: Two times a day (BID) | CUTANEOUS | Status: DC

## 2015-04-03 NOTE — Assessment & Plan Note (Signed)
Rash in buttock cleft and characteristic nail changes Triamcinolone ointment

## 2015-04-03 NOTE — Patient Instructions (Signed)
Great to meet you!  You should hear about your cat scan soon and your ct scan in the next few days.   If your bloody urine worsens or if you develop pain please come back or seek medical attention

## 2015-04-03 NOTE — Addendum Note (Signed)
Addended by: Elenora Gamma on: 04/03/2015 04:43 PM   Modules accepted: Orders

## 2015-04-03 NOTE — Addendum Note (Signed)
Addended by: Prescott Gum on: 04/03/2015 04:36 PM   Modules accepted: Kipp Brood

## 2015-04-03 NOTE — Assessment & Plan Note (Signed)
Gross hematuria, no pain toi indicate stone but + Hx CT abd pelvis Refer to urology CMP, CBC

## 2015-04-03 NOTE — Progress Notes (Signed)
Patient ID: Nicholas Palmer, male   DOB: 1955-10-05, 59 y.o.   MRN: [id number]   HPI  Patient presents today for evaluation of hematuria  Patient explains that he seeing gross blood in his urine for a few weeks. It's been off and on has helped at its been. It's ranged from barely visible to grossly bloody. He denies any flank pain but does have a history of kidney stones. He denies fevers, chills, sweats, palpitations, leg edema. He denies any chemical exposure in the past, he was previously a smoker.  Rash Has had a rash in his buttock cleft, groin, and lower abdomen that's come and gone over the last 2 years. He also has thickened yellowing pitting nails on his fingers and less severe in his toes. It gets scaly  at times. He has not had any medications that have helped it, he's tried baby oil without any improvement  PMH: Smoking status noted ROS: Per HPI  Objective: BP 121/78 mmHg  Pulse 75  Temp(Src) 98.1 F (36.7 C) (Oral)  Ht 5' 9"  (1.753 m)  Wt 194 lb 9.6 oz (88.27 kg)  BMI 28.72 kg/m2 Gen: NAD, alert, cooperative with exam HEENT: NCAT CV: RRR, good S1/S2, no murmur Resp: CTABL, no wheezes, non-labored Ext: No edema, warm Neuro: Alert and oriented, No gross deficits Skin: Slightly erythematous scaly patch on lower abdomen, buttock cleft, and groin with a few scattered areas of darker erythematous patches, all ten nails thickened, yellow, brittle, and pitting  Assessment and plan:  Hematuria Gross hematuria, no pain toi indicate stone but + Hx CT abd pelvis Refer to urology CMP, CBC  Psoriasis Rash in buttock cleft and characteristic nail changes Triamcinolone ointment   Orders Placed This Encounter  Procedures  . CT Abdomen Pelvis Wo Contrast    Standing Status: Future     Number of Occurrences:      Standing Expiration Date: 07/03/2016    Order Specific Question:  Reason for Exam (SYMPTOM  OR DIAGNOSIS REQUIRED)    Answer:  hematuria, eval for stone   Order Specific Question:  Preferred imaging location?    Answer:  External  . CMP14+EGFR  . CBC with Differential  . Ambulatory referral to Urology    Referral Priority:  Routine    Referral Type:  Consultation    Referral Reason:  Specialty Services Required    Requested Specialty:  Urology    Number of Visits Requested:  1  . POCT urinalysis dipstick  . POCT UA - Microscopic Only    Meds ordered this encounter  Medications  . triamcinolone ointment (KENALOG) 0.5 %    Sig: Apply 1 application topically 2 (two) times daily. For itching, up to 10 days straight then take 1 week off    Dispense:  30 g    Refill:  [place], MD [facility] 04/03/2015, 4:27 PM

## 2015-04-04 LAB — CMP14+EGFR
ALBUMIN: 4.7 g/dL (ref 3.6–4.8)
ALT: 18 IU/L (ref 0–44)
AST: 19 IU/L (ref 0–40)
Albumin/Globulin Ratio: 2 (ref 1.1–2.5)
Alkaline Phosphatase: 47 IU/L (ref 39–117)
BILIRUBIN TOTAL: 0.5 mg/dL (ref 0.0–1.2)
BUN / CREAT RATIO: 17 (ref 10–22)
BUN: 24 mg/dL (ref 8–27)
CHLORIDE: 99 mmol/L (ref 97–108)
CO2: 27 mmol/L (ref 18–29)
CREATININE: 1.4 mg/dL — AB (ref 0.76–1.27)
Calcium: 9.8 mg/dL (ref 8.6–10.2)
GFR calc non Af Amer: 54 mL/min/{1.73_m2} — ABNORMAL LOW (ref >59)
GFR, EST AFRICAN AMERICAN: 63 mL/min/{1.73_m2} (ref >59)
GLOBULIN, TOTAL: 2.4 g/dL (ref 1.5–4.5)
GLUCOSE: 95 mg/dL (ref 65–99)
Potassium: 5.1 mmol/L (ref 3.5–5.2)
SODIUM: 142 mmol/L (ref 134–144)
TOTAL PROTEIN: 7.1 g/dL (ref 6.0–8.5)

## 2015-04-04 LAB — CBC WITH DIFFERENTIAL/PLATELET
Basophils Absolute: 0 10*3/uL (ref 0.0–0.2)
Basos: 0 %
EOS (ABSOLUTE): 0.2 10*3/uL (ref 0.0–0.4)
EOS: 2 %
HEMATOCRIT: 43.7 % (ref 37.5–51.0)
Hemoglobin: 14.8 g/dL (ref 12.6–17.7)
Immature Grans (Abs): 0 10*3/uL (ref 0.0–0.1)
Immature Granulocytes: 0 %
LYMPHS ABS: 2.1 10*3/uL (ref 0.7–3.1)
Lymphs: 30 %
MCH: 29.7 pg (ref 26.6–33.0)
MCHC: 33.9 g/dL (ref 31.5–35.7)
MCV: 88 fL (ref 79–97)
Monocytes Absolute: 0.7 10*3/uL (ref 0.1–0.9)
Monocytes: 9 %
NEUTROS ABS: 4.2 10*3/uL (ref 1.4–7.0)
Neutrophils: 59 %
Platelets: 174 10*3/uL (ref 150–379)
RBC: 4.99 x10E6/uL (ref 4.14–5.80)
RDW: 13.6 % (ref 12.3–15.4)
WBC: 7.2 10*3/uL (ref 3.4–10.8)

## 2015-04-04 LAB — LIPID PANEL
Chol/HDL Ratio: 3.6 ratio units (ref 0.0–5.0)
Cholesterol, Total: 147 mg/dL (ref 100–199)
HDL: 41 mg/dL (ref >39)
LDL CALC: 79 mg/dL (ref 0–99)
Triglycerides: 135 mg/dL (ref 0–149)
VLDL Cholesterol Cal: 27 mg/dL (ref 5–40)

## 2015-04-06 DIAGNOSIS — R319 Hematuria, unspecified: Secondary | ICD-10-CM | POA: Diagnosis not present

## 2015-04-06 DIAGNOSIS — N281 Cyst of kidney, acquired: Secondary | ICD-10-CM | POA: Diagnosis not present

## 2015-04-06 NOTE — Telephone Encounter (Signed)
Pt missed a call from [place], but rescheduled appointment

## 2015-04-07 ENCOUNTER — Other Ambulatory Visit: Payer: Self-pay | Admitting: Cardiology

## 2015-04-09 ENCOUNTER — Other Ambulatory Visit: Payer: Self-pay | Admitting: Cardiothoracic Surgery

## 2015-04-09 ENCOUNTER — Telehealth: Payer: Self-pay | Admitting: Family Medicine

## 2015-04-09 DIAGNOSIS — I7100 Dissection of unspecified site of aorta: Secondary | ICD-10-CM

## 2015-04-09 NOTE — Telephone Encounter (Signed)
Scan was done at [facility]. Results were essentially normal. He does have some stable renal cysts. He is going to keep his urology appointment to further address the hematuria especially since the scan was negative.  Patient stated understanding and agreement to plan.

## 2015-04-10 ENCOUNTER — Ambulatory Visit: Payer: Medicare Other | Admitting: Family Medicine

## 2015-04-13 ENCOUNTER — Telehealth: Payer: Self-pay

## 2015-04-13 NOTE — Telephone Encounter (Signed)
Pt aware of CT scan result

## 2015-04-18 ENCOUNTER — Encounter: Payer: Self-pay | Admitting: Cardiology

## 2015-04-18 ENCOUNTER — Ambulatory Visit (INDEPENDENT_AMBULATORY_CARE_PROVIDER_SITE_OTHER): Payer: Medicare Other | Admitting: Cardiology

## 2015-04-18 VITALS — BP 109/71 | HR 70 | Ht 69.0 in | Wt 192.0 lb

## 2015-04-18 DIAGNOSIS — I10 Essential (primary) hypertension: Secondary | ICD-10-CM

## 2015-04-18 MED ORDER — METOPROLOL TARTRATE 25 MG PO TABS: 25.0000 mg | ORAL_TABLET | Freq: Two times a day (BID) | ORAL | Status: DC

## 2015-04-18 NOTE — Progress Notes (Signed)
HPI The patient presents for followup of hypertension and his aortic dissection. CT scan done last year demonstrates a stable thoracic repair.  He denies any chest pressure, neck or arm discomfort. He's had no presyncope or syncope.   He has had no PND or orthopnea. He's had no weight gain or edema.  He does walk without complaints.  He did have some hematuria recently and apparently had a noncontrasted CT that demonstrated no kidney stones. This has since stopped.    Allergies  Allergen Reactions  . Codeine Nausea And Vomiting    Current Outpatient Prescriptions  Medication Sig Dispense Refill  . aspirin 325 MG tablet Take 325 mg by mouth daily.      Marland Kitchen lisinopril (PRINIVIL,ZESTRIL) 10 MG tablet TAKE ONE TABLET BY MOUTH ONE TIME DAILY 30 tablet 11  . metoprolol (LOPRESSOR) 100 MG tablet TAKE ONE TABLET BY MOUTH TWICE DAILY 60 tablet 11  . metoprolol tartrate (LOPRESSOR) 25 MG tablet Take 25 mg by mouth 2 (two) times daily. Takes in addition to the 100mg  twice a day.    . simvastatin (ZOCOR) 20 MG tablet TAKE ONE TABLET BY MOUTH AT BEDTIME 30 tablet 11  . triamcinolone ointment (KENALOG) 0.5 % Apply 1 application topically 2 (two) times daily. For itching, up to 10 days straight then take 1 week off 30 g 0   No current facility-administered medications for this visit.    Past Medical History  Diagnosis Date  . 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 Hypertension   . Tobacco abuse   . Nephrolithiasis     has history; status post stone removal  . Decreased left ventricular function June 10, 2010    EF of 40-45% w/ out regional wall motion abnormalities 2011  EF 60%echo 2014    ROS:  Rash on the small of his back and legs unchanged.  Otherwise as stated in the HPI and negative for all other systems.  PHYSICAL EXAM BP 109/71 mmHg  Pulse 70  Ht 5\' 9"  (1.753 m)  Wt 192 lb (87.091 kg)  BMI 28.34  kg/m2  SpO2 97% GENERAL:  Well appearing HEENT:  Left corneal opacification, fundi not visualized, oral mucosa unremarkable, poor dentition NECK:  No jugular venous distention, waveform within normal limits, carotid upstroke brisk and symmetric, no bruits, no thyromegaly LUNGS:  Clear to auscultation bilaterally CHEST:  Well healed sternotomy scar. HEART:  PMI not displaced or sustained,S1 and S2 within normal limits, no S3, no S4, no clicks, no rubs, brief right upper sternal border murmur, no diastolic murmur ABD:  Flat, positive bowel sounds normal in frequency in pitch, no bruits, no rebound, no guarding, no midline pulsatile mass, no hepatomegaly, no splenomegaly EXT:  2 plus pulses throughout, no edema, no cyanosis no clubbing   EKG:  Sinus rhythm, rate 68 axis within normal limits, intervals within normal limits, no acute ST-T wave changes.  04/18/2015  ASSESSMENT AND PLAN  AORTIC DISSECTION REPAIRED:  This has been stable and he is scheduled for followup CTA of his chest in on 9/29 and follow up with Dr. Tyrone Sage.  His creat is mildly elevated but not a contraindication for the planned procedure.  He was instructed to hydrate before the procedure.    HTN:  His BP is well controlled and he will continue with meds as listed.   CARDIOMYOPATHY:  His last ejection fraction was 40%. This  was in 2011. However it was improved to 60% and the echo in 2014.  No further imaging is indicated.   DYSLIPIDEMIA:  His LDL was 79 recently. No change in therapy is indicated.

## 2015-04-18 NOTE — Patient Instructions (Signed)
Medication Instructions:  The current medical regimen is effective;  continue present plan and medications.  Follow-Up: Follow up in 1 year with Dr. Hochrein in [place].  You will receive a letter in the mail 2 months before you are due.  Please call us when you receive this letter to schedule your follow up appointment.  Thank you for choosing [facility]!!        

## 2015-04-19 ENCOUNTER — Telehealth: Payer: Self-pay | Admitting: Family Medicine

## 2015-04-19 NOTE — Telephone Encounter (Signed)
Labs faxed

## 2015-05-02 ENCOUNTER — Telehealth: Payer: Self-pay | Admitting: Family Medicine

## 2015-05-02 NOTE — Telephone Encounter (Signed)
Pt called [facility] after calling us, they do have the labs that LouAnn T faxed to them on 9/8 from our office

## 2015-05-10 ENCOUNTER — Ambulatory Visit
Admission: RE | Admit: 2015-05-10 | Discharge: 2015-05-10 | Disposition: A | Discharge status: Home / Self Care | Payer: Medicare Other | Source: Ambulatory Visit | Attending: Cardiothoracic Surgery | Admitting: Cardiothoracic Surgery

## 2015-05-10 ENCOUNTER — Ambulatory Visit
Admission: RE | Admit: 2015-05-10 | Discharge: 2015-05-10 | Disposition: A | Discharge status: Home / Self Care | Payer: Medicare Other | Source: Ambulatory Visit | Attending: Family Medicine | Admitting: Family Medicine

## 2015-05-10 ENCOUNTER — Ambulatory Visit
Admission: RE | Admit: 2015-05-10 | Discharge: 2015-05-10 | Disposition: A | Discharge status: Home / Self Care | Payer: Medicare Other | Source: Ambulatory Visit | Attending: Interventional Radiology | Admitting: Interventional Radiology

## 2015-05-10 ENCOUNTER — Other Ambulatory Visit: Payer: Self-pay | Admitting: Interventional Radiology

## 2015-05-10 ENCOUNTER — Ambulatory Visit (INDEPENDENT_AMBULATORY_CARE_PROVIDER_SITE_OTHER): Payer: Medicare Other | Admitting: Cardiothoracic Surgery

## 2015-05-10 ENCOUNTER — Encounter: Payer: Self-pay | Admitting: Cardiothoracic Surgery

## 2015-05-10 VITALS — BP 135/80 | HR 85 | Resp 20 | Ht 69.0 in | Wt 192.0 lb

## 2015-05-10 DIAGNOSIS — R319 Hematuria, unspecified: Secondary | ICD-10-CM

## 2015-05-10 DIAGNOSIS — I7100 Dissection of unspecified site of aorta: Secondary | ICD-10-CM | POA: Diagnosis not present

## 2015-05-10 DIAGNOSIS — I7101 Dissection of thoracic aorta: Secondary | ICD-10-CM | POA: Diagnosis not present

## 2015-05-10 DIAGNOSIS — Z8679 Personal history of other diseases of the circulatory system: Secondary | ICD-10-CM | POA: Diagnosis not present

## 2015-05-10 MED ORDER — IOPAMIDOL (ISOVUE-370) INJECTION 76%
75.0000 mL | Freq: Once | INTRAVENOUS | Status: AC | PRN
  Administered 2015-05-10: 75 mL via INTRAVENOUS

## 2015-05-10 NOTE — Progress Notes (Signed)
[address]       [place] [address]             [phone]                Nicholas Palmer [facility] Medical Record #[id number] Date of Birth: [birth year]  Referring: Dr. Antoine Poche Primary Care: Rudi Heap, MD  Chief Complaint:    Chief Complaint  Patient presents with  . 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 He continues to see cardiology for control of his blood pressure He denies any chest pain or anginal symptoms. He's had no overt symptoms of congestive heart failure. He continues to be a nonsmoker.      Current Activity/ Functional Status: Patient is independent with mobility/ambulation, transfers, ADL's, IADL's.   Past Medical History  Diagnosis Date  . 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 Hypertension   . Tobacco abuse   . Nephrolithiasis     has history; status post stone removal  . Decreased left ventricular function June 10, 2010    EF of 40-45% w/ out regional wall motion abnormalities 2011  EF 60%echo 2014    Past Surgical History  Procedure Laterality Date  . Repair of acute ascending thoracic aortic dissection      Aortic root replacement with resuspension of the aortic valve.  06/10/10 Dr. Tyrone Sage    Family History  Problem Relation Age of Onset  . Lung disease Mother   . Kidney disease Sister   two sisters, one had brain aneurysm and was on dialysis died in her 57s His twin brother died of a myocardial infarction at age 86 Father died of myocardial infarction at age 33 Sister died at age 29 of an abdominal aortic aneurysm  Social History   Social History  . Marital Status: Legally Separated    Spouse Name: N/A  . Number of Children: N/A    . Years of Education: N/A   Occupational History  . Not on file.   Social History Main Topics  . Smoking status: Former Smoker    Quit date: 08/11/2002  . Smokeless tobacco: Not on file  . Alcohol Use: No  . Drug Use: No  . Sexual Activity: Not on file   Other Topics Concern  . Not on file   Social History Narrative   Has 20-pack-year history of tobacco abuse, quitting many years ago. Denies alcohol or drugs. Does not routinely exercise     History  Smoking status  . Former Smoker  . Quit date: 08/11/2002  Smokeless tobacco  . Not on file    History  Alcohol Use No     Allergies  Allergen Reactions  . Codeine Nausea And Vomiting    Current Outpatient Prescriptions  Medication Sig Dispense Refill  . aspirin 325 MG tablet Take 325 mg by mouth daily.      Marland Kitchen lisinopril (PRINIVIL,ZESTRIL) 10 MG tablet TAKE ONE TABLET BY MOUTH ONE TIME DAILY 30 tablet 11  . metoprolol (LOPRESSOR) 100 MG tablet TAKE ONE TABLET BY MOUTH TWICE DAILY 60 tablet 11  . metoprolol tartrate (LOPRESSOR) 25 MG tablet Take  1 tablet (25 mg total) by mouth 2 (two) times daily. Takes in addition to the 100mg  twice a day. 180 tablet 3  . simvastatin (ZOCOR) 20 MG tablet TAKE ONE TABLET BY MOUTH AT BEDTIME 30 tablet 11  . triamcinolone ointment (KENALOG) 0.5 % Apply 1 application topically 2 (two) times daily. For itching, up to 10 days straight then take 1 week off 30 g 0   No current facility-administered medications for this visit.       Review of Systems:     Cardiac Review of Systems: Y or N  Chest Pain [  n ]  Resting SOB [n] Exertional SOB  [n  ]  Orthopnea [n]   Pedal Edema [ n  ]    Palpitations [n  ] Syncope  [  n]   Presyncope [ n ]  General Review of Systems: [Y] = yes [  ]=no Constitional: recent weight change [  ]; anorexia [  ]; fatigue [  ]; nausea [  ]; night sweats [  ]; fever [  ]; or chills [  ];                                                                                                                                           Dental: poor dentition[ y ];  Eye : blurred vision [  ]; diplopia [   ]; vision changes [  ];  Amaurosis fugax[  ]; Resp: cough [  ];  wheezing[ n ];  hemoptysis[ n ]; shortness of breath[ n ]; paroxysmal nocturnal dyspnea[ n ]; dyspnea on exertion[  ]; or orthopnea[  ];  GI:  gallstones[  ], vomiting[  ];  dysphagia[  ]; melena[  ];  hematochezia [  ]; heartburn[  ];   Hx of  Colonoscopy[ no ]; GU: kidney stones [  ]; hematuria[  ];   dysuria [  ];  nocturia[  ];  history of     obstruction [  ];             Skin: rash, swelling[  ];, hair loss[  ];  peripheral edema[  ];  or itching[  ]; Musculosketetal: myalgias[  ];  joint swelling[  ];  joint erythema[  ];  joint pain[  ];  back pain[  ];  Heme/Lymph: bruising[  ];  bleeding[  ];  anemia[  ];  Neuro: TIA[ n ];  headaches[  ];  stroke[ n ];  vertigo[  ];  seizures[  ];   paresthesias[  ];  difficulty walking[  ];  Psych:depression[  ]; anxiety[  ];  Endocrine: diabetes[  ];  thyroid dysfunction[  ];  Immunizations: Flu [no ]; Pneumococcal[no  ];  Other:  Physical Exam: BP 135/80 mmHg  Pulse 85  Resp 20  Ht 5\' 9"  (1.753 m)  Wt 192 lb (87.091 kg)  BMI 28.34 kg/m2  SpO2 97%  General appearance: alert, cooperative, appears stated age and no distress Neurologic: intact Heart: regular rate and rhythm, S1, S2 normal, no murmur I do not appreciate any murmur of aortic insufficiency, click, rub or gallop and normal apical impulse Lungs: clear to auscultation bilaterally and normal percussion bilaterally Abdomen: soft, non-tender; bowel sounds normal; no masses,  no organomegaly Extremities: extremities normal, atraumatic, no cyanosis or edema and Homans sign is negative, no sign of DVT Patient has full and equal radial brachial and pedal pulses  Diagnostic Studies & Laboratory data:     Recent Radiology Findings:  Ct Angio Chest W/cm &/or Wo Cm  05/10/2015   CLINICAL DATA:   Followup aortic dissection.  EXAM: CT ANGIOGRAPHY CHEST WITH CONTRAST  TECHNIQUE: Multidetector CT imaging of the chest was performed using the standard protocol during bolus administration of intravenous contrast. Multiplanar CT image reconstructions and MIPs were obtained to evaluate the vascular anatomy.  CONTRAST:  75 cc Isovue 370  COMPARISON:  CT scan 04/20/2014  FINDINGS: Mediastinum/Nodes: The chest wall is unremarkable. No chest wall masses, supraclavicular or axillary adenopathy. Small scattered lymph nodes are noted and are stable. The thyroid gland is normal.  The heart is normal in size. No pericardial effusion. Stable coronary artery calcifications. There are small scattered mediastinal and hilar lymph nodes but no mass or overt adenopathy. The esophagus is grossly normal.  Aorta:  Surgical changes of prior type a aortic dissection repair. As was demonstrated on prior there is persisting dissection flap extending from the aortic root repair. The flap again is seen to extend through the aortic arch and the length of descending aorta and visualized abdominal aorta Greatest diameter of the transverse arch measures 4.2 cm, unchanged from prior.  Again the dissection flap extends into the innominate artery. The innominate artery is perfused both by the true lumen and false lumen. The left common carotid artery and left subclavian artery are perfused from the true lumen.  Lungs/Pleura: No acute pulmonary findings. No pulmonary lesions or pleural effusion. The tracheobronchial tree is unremarkable.  Upper abdomen: The dissection courses down below the renal arteries. The aortic branch vessels are patent.  Stable left hepatic lobe cyst. The pancreas and spleen are normal and stable. The adrenal glands are normal. Stable left renal cysts. A retroaortic left renal vein is noted.  Musculoskeletal: No significant osseous findings.  Review of the MIP images confirms the above findings.  IMPRESSION: Stable CT  appearance of the thoracic and aortic dissection. No complicating features are demonstrated.  No acute pulmonary findings.  Stable upper abdomen.   Electronically Signed   By: Rudie Meyer M.D.   On: 05/10/2015 13:27   Ct Angio Chest Aorta W/cm &/or Wo/cm  04/20/2014   CLINICAL DATA:  59 year old male with a history of previous type A aortic dissection. Surgical repair was October 2011.  EXAM: CTA ABDOMEN AND PELVIS wITHOUT AND WITH CONTRAST  TECHNIQUE: Multidetector CT imaging of the abdomen and pelvis was performed using the standard protocol during bolus administration of intravenous contrast. Multiplanar reconstructed images and MIPs were obtained and reviewed to evaluate the vascular anatomy.  CONTRAST:  80mL OMNIPAQUE IOHEXOL 350 MG/ML SOLN  COMPARISON:  Multiple prior CT for comparison. Most recent CT 02/10/2013  FINDINGS: Nonvascular:  Chest:  Unremarkable appearance of the soft tissues of the chest. No supraclavicular or axillary adenopathy. Surgical changes of prior median sternotomy. Surgical changes also present within the right supraclavicular region.  Unremarkable appearance  of the visualized thyroid.  Central airways patent.  No mediastinal adenopathy.  Heart size within normal limits.  Surgical changes of previous type a aortic dissection.  No confluent airspace disease or pneumothorax. No pleural effusion. Minimal atelectasis at the bilateral bases. No pulmonary nodules identified.  Abdomen/pelvis:  Low-density lesion within segment 4 a (image 92 series 4), which is larger than on the comparison CT, and in fact has enlarged to double the size of the lesion on the first available comparison 12/14/2006. In 2008 lesion measured approximately 8 mm and is now 16 mm.  There are additional small hypodensities measuring a few mm in segment 4B (image 119) as well as in segment 3 (image 108).  No intrahepatic or extrahepatic biliary ductal dilatation.  No evidence of radiopaque gallstones with no  pericholecystic fluid or inflammatory changes. No peripancreatic fluid or inflammatory changes.  Unremarkable appearance of the bilateral adrenal glands. Unremarkable appearance of the spleen.  No abnormally distended small bowel or colon. Normal appendix. There are a few colonic diverticula with no evidence of associated inflammatory changes.  Unremarkable appearance of the right kidney with no hydronephrosis or nephrolithiasis. Unremarkable right ureter. No left-sided hydronephrosis or nephrolithiasis. Exophytic cyst of the left kidney measures 5.2 cm, unchanged from prior. Additional small cyst interpolar region measures 18 mm.  No peritoneal lymphadenopathy.  Small portacaval lymph node.  Unremarkable appearance of the urinary bladder. Transverse diameter of prostate measures nearly 5 cm.  Vascular:  Aorta:  Surgical changes of prior type a aortic dissection repair. As was demonstrated on prior there is persisting dissection flap extending from the aortic root repair. The flap again is seen to extend through the aortic arch and the length of descending aorta through the bifurcation.  Greatest diameter of the transverse arch measures 4.2 cm, unchanged from prior.  Again the dissection flap extends into the innominate artery. The innominate artery is perfused both by the true lumen and false lumen. The left common carotid artery and left subclavian artery are perfused from the true lumen.  True lumen perfuses the celiac artery superior mesenteric artery, and inferior mesenteric artery.  Dissection flap extends into the origin of the right renal artery which is perfused by both lumen and remains patent.  Two separate left renal artery, each appear perfused via the true lumen.  The iliac arteries:  The dissection flap extends through the aortic bifurcation into the bilateral common iliac arteries. On the right, the dissection flap terminates at the origin of the hypogastric artery. Hypogastric artery appears  perfused via the true lumen, although the flap may extend into the epigastric artery.  On the left the dissection flap extends into the hypogastric artery which remains perfused. The left external iliac artery is of normal caliber and remains perfused.  Venous:  Unremarkable appearance of the portal venous system. Retro aortic left renal vein.  Review of the MIP images confirms the above findings.  IMPRESSION: Re-demonstration of surgically repaired type A aortic dissection. The greatest diameter of the aorta and configuration of the dissection flap are unchanged, with flap extending from the aortic root repair into the bilateral iliac arteries, as above. Perfusion is maintained within the branch vessels of the aortic arch and of the mesenteric vessels.  Low-density lesion of the left hepatic lobe has slowly increased in size from first comparison CT in 2008. Further evaluation with a liver protocol MRI may be warranted, particularly if the patient has a history of a known malignancy.  These results will  be called to the ordering clinician or representative by the Radiologist Assistant, and communication documented in the PACS or zVision Dashboard.  Signed,  Yvone Neu. Loreta Ave, DO  [facility]  [facility]   Electronically Signed   By: Gilmer Mor O.D.   On: 04/20/2014 15:37      Ct Angio Chest W/cm &/or Wo Cm  02/10/2013   *RADIOLOGY REPORT*  Clinical Data:  Follow up aortic dissection. History of type 1 aortic dissection and status post replacement of the ascending thoracic aorta and resuspension of the aortic valve.  CT ANGIOGRAPHY CHEST, ABDOMEN AND PELVIS  Technique:  Multidetector CT imaging through the chest, abdomen and pelvis was performed using the standard protocol during bolus administration of intravenous contrast.  Multiplanar reconstructed images including MIPs were obtained and reviewed to evaluate the vascular anatomy.  Contrast: 80mL OMNIPAQUE IOHEXOL  350 MG/ML SOLN,  Comparison:  12/25/2011  CTA CHEST  Findings:  Again noted are postsurgical changes consistent with replacement of the ascending thoracic aorta.  The ascending thoracic aorta is patent.  There is a dissection involving the aortic arch that extends in the right innominate artery and terminates at the innominate artery bifurcation. The cephalad extent of the dissection is unchanged.  There is flow in the great vessels.  Posterior aortic arch roughly measures 4.3 cm in diameter and not significantly changed.  There is slightly increased thrombus within the posterior false lumen of the descending thoracic aorta.  The thoracic aorta at the hiatus measures 3.1 cm unchanged.  No significant pericardial or pleural fluid.  A precarinal lymph node roughly measures 0.9 cm in short axis and minimally changed. Overall, there is no significant chest lymphadenopathy.  The trachea and mainstem bronchi are patent.  Stable pleural-based scarring at the posterior right lung base.  A small area of pleural thickening along the right major fissure on image 33, sequence #5. There is a stable 5 mm pleural based nodule in the posterior right upper lobe on image 15, sequence five.  This is probably unchanged since 06/10/2010.   No focal consolidation or parenchymal disease in the lungs.  No acute bony abnormality.   Review of the MIP images confirms the above findings.  IMPRESSION: No significant change in the thoracic aortic dissection.  There is slightly increased mural thrombus within the false lumen.  No significant change in the appearance of the ascending aorta replacement.  Stable pleural based nodules in the lungs as described.  Recommend attention to these on followup imaging.  CTA ABDOMEN AND PELVIS  Findings:  The aortic dissection extends into the abdominal aorta and terminates in the proximal right external iliac artery and proximal left internal iliac artery.  The configuration and extent of the dissection is  unchanged.  There is no significant aneurysmal dilatation of the aorta.  The abdominal aorta roughly measures up to 2.8 cm at the level of the celiac trunk which is unchanged.  The celiac trunk, superior mesenteric artery and both left renal arteries originate from the anterior lumen, which is probably the true lumen.  Dissection extends into the right renal artery. Question a small fenestration of the dissection on image 138.  The IMA originates from the anterior lumen.  Iliac arteries and proximal femoral arteries are patent bilaterally.  Stable low density structure in the left hepatic lobe roughly measures 1.4 cm.  No gross abnormality to the liver, gallbladder, spleen, pancreas or adrenal glands.  4.6 cm cyst involving left kidney upper  pole.  There is a smaller cyst in the left kidney mid pole.  Incidentally, there is a retroaortic left renal vein. Normal appearance of the appendix.  No gross abnormality prostate, seminal vesicles and urinary bladder.  No gross abnormality of the small or large bowel.  No acute bony abnormality.   Review of the MIP images confirms the above findings.  IMPRESSION: Stable appearance of the aortic dissection.  No acute abnormalities within the abdomen or pelvis.  Left renal cysts.   Original Report Authenticated By: Richarda Overlie, M.D.    Recent Lab Findings: Lab Results  Component Value Date   WBC 7.2 04/03/2015   HGB 9.3* 06/14/2010   HCT 43.7 04/03/2015   PLT 148* 06/14/2010   GLUCOSE 95 04/03/2015   CHOL 147 04/03/2015   TRIG 135 04/03/2015   HDL 41 04/03/2015   LDLCALC 79 04/03/2015   ALT 18 04/03/2015   AST 19 04/03/2015   NA 142 04/03/2015   K 5.1 04/03/2015   CL 99 04/03/2015   CREATININE 1.40* 04/03/2015   BUN 24 04/03/2015   CO2 27 04/03/2015   INR 0.82 06/10/2010   ECHO: [facility] [address] [place], [place] [address] [phone]  ------------------------------------------------------------ Transthoracic  Echocardiography  Patient: Braylen, Staller MR #: [id number] Study Date: 01/26/2013 Gender: M Age: 73 Height: 172.7cm Weight: 90.7kg BSA: 2.84m^2 Pt. Status: Room:  ATTENDING Corbin Ade, Stanford [place] REFERRING [facility] PERFORMING Willeen Niece SONOGRAPHER Greggory Stallion, RDCS cc:  ------------------------------------------------------------ LV EF: 60%  ------------------------------------------------------------ History: PMH: Ao v repair and thoracic aortic disection repaired 05-2010 Dyspnea.  ------------------------------------------------------------ Study Conclusions  - Left ventricle: The cavity size was normal. Wall thickness was normal. The estimated ejection fraction was 60%. Wall motion was normal; there were no regional wall motion abnormalities. - Aortic valve: Sclerosis without stenosis. No significant regurgitation. - Right ventricle: The cavity size was mildly dilated. Systolic function was mildly reduced. - Impressions: I can not tell from the history provided what surgery the patient actually had for the aorta and aortic valve. However, currently the aortic root is of upper normal size. The aortic valve is working well. Impressions:  - I can not tell from the history provided what surgery the patient actually had for the aorta and aortic valve. However, currently the aortic root is of upper normal size. The aortic valve is working well. Transthoracic echocardiography. M-mode, complete 2D, spectral Doppler, and color Doppler. Height: Height: 172.7cm. Height: 68in. Weight: Weight: 90.7kg. Weight: 199.6lb. Body mass index: BMI: 30.4kg/m^2. Body surface area: BSA: 2.54m^2. Blood pressure: 125/75. Patient status: Outpatient.  ------------------------------------------------------------  ------------------------------------------------------------ Left ventricle: The cavity size was normal. Wall  thickness was normal. The estimated ejection fraction was 60%. Wall motion was normal; there were no regional wall motion abnormalities.  ------------------------------------------------------------ Aortic valve: Sclerosis without stenosis. Doppler: No significant regurgitation. VTI ratio of LVOT to aortic valve: 0.74. Valve area: 2.34cm^2(VTI). Peak velocity ratio of LVOT to aortic valve: 0.79. Valve area: 2.48cm^2 (Vmax). Mean gradient: 4mm Hg (S).  ------------------------------------------------------------ Aorta: Aortic root: The aortic root was normal in size.  ------------------------------------------------------------ Mitral valve: Structurally normal valve. Leaflet separation was normal. Doppler: Transvalvular velocity was within the normal range. There was no evidence for stenosis. No regurgitation. Peak gradient: 3mm Hg (D).  ------------------------------------------------------------ Left atrium: The atrium was at the upper limits of normal in size.  ------------------------------------------------------------ Right ventricle: The cavity size was mildly dilated. Systolic function was mildly reduced.  ------------------------------------------------------------ Pulmonic  valve: The valve appears to be grossly normal. Doppler: No significant regurgitation.  ------------------------------------------------------------ Tricuspid valve: The valve appears to be grossly normal. Doppler: Mild regurgitation.  ------------------------------------------------------------ Right atrium: The atrium was at the upper limits of normal in size.  ------------------------------------------------------------ Pericardium: There was no pericardial effusion.  ------------------------------------------------------------  2D measurements Normal Doppler measurements Normal Left ventricle LVOT LVID ED, 40.2 mm 43-52 Peak vel, S 102 cm/s ------ chord, VTI, S 23 cm ------ PLAX Aortic  valve LVID ES, 30 mm 23-38 Peak vel, S 129 cm/s ------ chord, Mean vel, S 94. cm/s ------ PLAX 1 FS, chord, 25 % >29 VTI, S 30. cm ------ PLAX 9 LVPW, ED 9.84 mm ------ Mean 4 mm ------ IVS/LVPW 1.02 <1.3 gradient, S Hg ratio, ED VTI ratio 0.7 ------ Vol ED, 57 ml ------ LVOT/AV 4 MOD1 Area, VTI 2.3 cm^2 ------ Vol ES, 18 ml ------ 4 MOD1 Peak vel 0.7 ------ EF, MOD1 68 % ------ ratio, 9 Vol index, 28 ml/m^2 ------ LVOT/AV ED, MOD1 Area, Vmax 2.4 cm^2 ------ Vol index, 9 ml/m^2 ------ 8 ES, MOD1 Mitral valve Vol ED, 62 ml ------ Peak E vel 86. cm/s ------ MOD2 4 Vol ES, 20 ml ------ Peak A vel 108 cm/s ------ MOD2 Deceleration 218 ms 150-23 EF, MOD2 68 % ------ time 0 Stroke 42 ml ------ Peak 3 mm ------ vol, MOD2 gradient, D Hg Vol index, 30 ml/m^2 ------ Peak E/A 0.8 ------ ED, MOD2 ratio Vol index, 10 ml/m^2 ------ Tricuspid valve ES, MOD2 Regurg peak 258 cm/s ------ Stroke 20.6 ml/m^2 ------ vel index, Peak RV-RA 27 mm ------ MOD2 gradient, S Hg Ventricular septum Pulmonic valve IVS, ED 10 mm ------ Peak vel, S 128 cm/s ------ LVOT Regurg vel, 61. cm/s ------ Diam, S 20 mm ------ ED 5 Area 3.14 cm^2 ------ Aorta Root diam, 37 mm ------ ED Left atrium AP dim 39 mm ------ AP dim 1.91 cm/m^2 <2.2 index Vol, S 43 ml ------ Vol index, 21.1 ml/m^2 ------ S Right ventricle RVID ED, 32.4 mm 19-38 PLAX  ------------------------------------------------------------ Prepared and Electronically Authenticated by  Willa Rough 2014-06-18T17:[id number]:44.147   Assessment / Plan:   Patient returns today in followup after repair of ascending aorta dissection and resuspension of his aortic valve for acute aortic dissection in October 2011. He has a persistent false lumen and the arch and descending thoracic aorta, followup CT scans shows stable situation compared to previous scans. Low density lesion in the left lobe of the liver is unchanged.  We'll plan to see him back in  18 months  with a repeat CTA of the chest.  Last echocardiogram was 2014 Delight Ovens MD  Beeper [phone] Office [phone] 05/10/2015 2:22 PM

## 2016-01-28 ENCOUNTER — Encounter: Payer: Self-pay | Admitting: Family Medicine

## 2016-02-19 ENCOUNTER — Encounter: Payer: Self-pay | Admitting: Family Medicine

## 2016-02-19 ENCOUNTER — Ambulatory Visit (INDEPENDENT_AMBULATORY_CARE_PROVIDER_SITE_OTHER): Payer: Medicare Other | Admitting: Family Medicine

## 2016-02-19 VITALS — BP 105/60 | HR 76 | Temp 97.4°F | Ht 69.0 in | Wt 191.8 lb

## 2016-02-19 DIAGNOSIS — I10 Essential (primary) hypertension: Secondary | ICD-10-CM

## 2016-02-19 DIAGNOSIS — R319 Hematuria, unspecified: Secondary | ICD-10-CM | POA: Diagnosis not present

## 2016-02-19 DIAGNOSIS — L409 Psoriasis, unspecified: Secondary | ICD-10-CM

## 2016-02-19 LAB — URINALYSIS, COMPLETE
Bilirubin, UA: NEGATIVE
GLUCOSE, UA: NEGATIVE
Ketones, UA: NEGATIVE
Leukocytes, UA: NEGATIVE
NITRITE UA: NEGATIVE
PROTEIN UA: NEGATIVE
Specific Gravity, UA: 1.025 (ref 1.005–1.030)
Urobilinogen, Ur: 1 mg/dL (ref 0.2–1.0)
pH, UA: 5.5 (ref 5.0–7.5)

## 2016-02-19 LAB — MICROSCOPIC EXAMINATION
Bacteria, UA: NONE SEEN
Epithelial Cells (non renal): NONE SEEN /hpf (ref 0–10)
WBC, UA: NONE SEEN /hpf (ref 0–?)

## 2016-02-19 NOTE — Patient Instructions (Addendum)
Great to see you!  We will call within 1 week with your labs  Try to get the stool card back to us within 1 week.   Keep an eye on your blood pressure, try to stay well hydrated. If you are consistentlly in the 90s or you are getting more dizziness please come back.

## 2016-02-19 NOTE — Progress Notes (Signed)
   HPI  Patient presents today here for follow-up of chronic medical conditions.  Hypertension States his blood pressure goes up and down, occasionally as low as the 90s but often in the 120 to 1:30 range. He does have occasional dizziness that lasts a few moments as well as blurred vision lasted a few moments associated with episodes of dizziness. Pain, dyspnea, palpitations  Psoriasis Continues to have gluteal cleft lesion, also complains of nail changes for the last 5 years. Declines dermatology referral   Hematuria Has not recurred Denies any flank pain, or dysuria.  Get a colonoscopy right now "I'll think about it".  PMH: Smoking status noted ROS: Per HPI  Objective: BP 105/60 mmHg  Pulse 76  Temp(Src) 97.4 F (36.3 C) (Oral)  Ht _0  (1.753 m)  Wt 191 lb 12.8 oz (87 kg)  BMI 28.31 kg/m2 Gen: NAD, alert, cooperative with exam HEENT: NCAT CV: RRR, good S1/S2, no murmur Resp: CTABL, no wheezes, non-labored Ext: No edema, warm Neuro: Alert and oriented, No gross deficits  Skin  fingernails with characteristic yellowing and peeling of psoriasis.   Assessment and plan:  # Hypertension Very well controlled, possibly a little overtreated Recommended/encouraged good hydration Monitor symptoms of dizziness I do not want to back off currently considering that he has history of stable aortic dissection  # Psoriasis Characteristic fingernail changes, offered dermatology referral which she declines.  # Immaturity Has not recurred, recheck urine today Consider urology follow-up if blood is persistent  Blurred vision, intermittent and momentary likely associated with low blood pressure with episodes of dizziness. Also consider diabetes, checking blood sugar today, low threshold for adding A1c.     Orders Placed This Encounter  Procedures  . Urinalysis, Complete  . CMP14+EGFR  . CBC with Differential/Platelet  . Lipid panel  . TSH    Laroy Apple,  MD [facility] 02/19/2016, 10:27 AM

## 2016-02-20 LAB — CBC WITH DIFFERENTIAL/PLATELET
BASOS: 0 %
Basophils Absolute: 0 10*3/uL (ref 0.0–0.2)
EOS (ABSOLUTE): 0.3 10*3/uL (ref 0.0–0.4)
Eos: 4 %
HEMOGLOBIN: 14.5 g/dL (ref 12.6–17.7)
Hematocrit: 43.3 % (ref 37.5–51.0)
IMMATURE GRANS (ABS): 0 10*3/uL (ref 0.0–0.1)
Immature Granulocytes: 0 %
Lymphocytes Absolute: 2.1 10*3/uL (ref 0.7–3.1)
Lymphs: 27 %
MCH: 29.8 pg (ref 26.6–33.0)
MCHC: 33.5 g/dL (ref 31.5–35.7)
MCV: 89 fL (ref 79–97)
MONOS ABS: 0.6 10*3/uL (ref 0.1–0.9)
Monocytes: 7 %
NEUTROS ABS: 4.8 10*3/uL (ref 1.4–7.0)
Neutrophils: 62 %
Platelets: 166 10*3/uL (ref 150–379)
RBC: 4.87 x10E6/uL (ref 4.14–5.80)
RDW: 13.2 % (ref 12.3–15.4)
WBC: 7.8 10*3/uL (ref 3.4–10.8)

## 2016-02-20 LAB — LIPID PANEL
CHOLESTEROL TOTAL: 132 mg/dL (ref 100–199)
Chol/HDL Ratio: 3.3 ratio units (ref 0.0–5.0)
HDL: 40 mg/dL (ref >39)
LDL CALC: 66 mg/dL (ref 0–99)
Triglycerides: 130 mg/dL (ref 0–149)
VLDL CHOLESTEROL CAL: 26 mg/dL (ref 5–40)

## 2016-02-20 LAB — CMP14+EGFR
ALBUMIN: 4.4 g/dL (ref 3.6–4.8)
ALK PHOS: 44 IU/L (ref 39–117)
ALT: 14 IU/L (ref 0–44)
AST: 15 IU/L (ref 0–40)
Albumin/Globulin Ratio: 1.6 (ref 1.2–2.2)
BILIRUBIN TOTAL: 0.4 mg/dL (ref 0.0–1.2)
BUN/Creatinine Ratio: 14 (ref 10–24)
BUN: 21 mg/dL (ref 8–27)
CO2: 25 mmol/L (ref 18–29)
Calcium: 9.4 mg/dL (ref 8.6–10.2)
Chloride: 101 mmol/L (ref 96–106)
Creatinine, Ser: 1.53 mg/dL — ABNORMAL HIGH (ref 0.76–1.27)
GFR, EST AFRICAN AMERICAN: 56 mL/min/{1.73_m2} — AB (ref >59)
GFR, EST NON AFRICAN AMERICAN: 48 mL/min/{1.73_m2} — AB (ref >59)
Globulin, Total: 2.8 g/dL (ref 1.5–4.5)
Glucose: 96 mg/dL (ref 65–99)
Potassium: 5.2 mmol/L (ref 3.5–5.2)
SODIUM: 142 mmol/L (ref 134–144)
Total Protein: 7.2 g/dL (ref 6.0–8.5)

## 2016-02-20 LAB — TSH: TSH: 2.37 u[IU]/mL (ref 0.450–4.500)

## 2016-03-27 ENCOUNTER — Other Ambulatory Visit: Payer: Self-pay | Admitting: Cardiology

## 2016-03-27 NOTE — Telephone Encounter (Signed)
REFILL 

## 2016-04-08 ENCOUNTER — Encounter: Payer: Self-pay | Admitting: Cardiology

## 2016-04-22 NOTE — Progress Notes (Signed)
HPI The patient presents for followup of hypertension and his aortic dissection. CT scan done 04/2015 demonstrates a stable thoracic repair.  He is followed routinely by Dr. Tyrone SageGerhardt.  He returns for follow up.  Since I last saw him he has done well.  The patient denies any new symptoms such as chest discomfort, neck or arm discomfort. There has been no new shortness of breath, PND or orthopnea. There have been no reported palpitations, presyncope or syncope.  Allergies  Allergen Reactions  . Codeine Nausea And Vomiting    Current Outpatient Prescriptions  Medication Sig Dispense Refill  . aspirin 325 MG tablet Take 325 mg by mouth daily.      Marland Kitchen. lisinopril (PRINIVIL,ZESTRIL) 10 MG tablet Take 1 tablet (10 mg total) by mouth daily. 90 tablet 3  . metoprolol (LOPRESSOR) 100 MG tablet Take 1 tablet (100 mg total) by mouth 2 (two) times daily. 180 tablet 3  . metoprolol tartrate (LOPRESSOR) 25 MG tablet Take 1 tablet (25 mg total) by mouth 2 (two) times daily. Takes in addition to the 100mg  twice a day. 180 tablet 3  . simvastatin (ZOCOR) 20 MG tablet Take 1 tablet (20 mg total) by mouth at bedtime. 90 tablet 3   No current facility-administered medications for this visit.     Past Medical History:  Diagnosis Date  . 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 Decreased left ventricular function June 10, 2010   EF of 40-45% w/ out regional wall motion abnormalities 2011  EF 60%echo 2014  . Hypertension   . Nephrolithiasis    has history; status post stone removal  . Tobacco abuse     ROS:  As stated in the HPI and negative for all other systems.  PHYSICAL EXAM BP 106/66   Pulse 75   Ht 5\' 9"  (1.753 m)   Wt 188 lb (85.3 kg)   BMI 27.76 kg/m  GENERAL:  Well appearing HEENT:  Left corneal opacification, fundi not visualized, oral mucosa unremarkable, poor dentition NECK:  No jugular venous  distention, waveform within normal limits, carotid upstroke brisk and symmetric, no bruits, no thyromegaly LUNGS:  Clear to auscultation bilaterally CHEST:  Well healed sternotomy scar. HEART:  PMI not displaced or sustained,S1 and S2 within normal limits, no S3, no S4, no clicks, no rubs, brief right upper sternal border murmur, no diastolic murmur ABD:  Flat, positive bowel sounds normal in frequency in pitch, no bruits, no rebound, no guarding, no midline pulsatile mass, no hepatomegaly, no splenomegaly EXT:  2 plus pulses throughout, no edema, no cyanosis no clubbing  Lab Results  Component Value Date   CHOL 132 02/19/2016   TRIG 130 02/19/2016   HDL 40 02/19/2016   LDLCALC 66 02/19/2016    EKG:  Sinus rhythm, rate 75 axis within normal limits, intervals within normal limits, no acute ST-T wave changes.  04/23/2016  ASSESSMENT AND PLAN  AORTIC DISSECTION REPAIRED:  This was stable and he is to see Dr. Tyrone SageGerhardt in another 10 months or so.  No change in therapy is indicated.   HTN:  His BP is well controlled and he will continue with meds as listed.   CARDIOMYOPATHY:  His last ejection fraction was 40%. This was in 2011. However it was improved to 60% and the echo in 2014.  No further imaging is indicated.   DYSLIPIDEMIA:  His LDL was as above  recently. No change in therapy is indicated.

## 2016-04-23 ENCOUNTER — Encounter: Payer: Self-pay | Admitting: Cardiology

## 2016-04-23 ENCOUNTER — Ambulatory Visit (INDEPENDENT_AMBULATORY_CARE_PROVIDER_SITE_OTHER): Payer: Medicare Other | Admitting: Cardiology

## 2016-04-23 VITALS — BP 106/66 | HR 75 | Ht 69.0 in | Wt 188.0 lb

## 2016-04-23 DIAGNOSIS — I71019 Dissection of thoracic aorta, unspecified: Secondary | ICD-10-CM

## 2016-04-23 DIAGNOSIS — I7101 Dissection of thoracic aorta: Secondary | ICD-10-CM | POA: Diagnosis not present

## 2016-04-23 DIAGNOSIS — I10 Essential (primary) hypertension: Secondary | ICD-10-CM

## 2016-04-23 MED ORDER — METOPROLOL TARTRATE 100 MG PO TABS: 100.0000 mg | ORAL_TABLET | Freq: Two times a day (BID) | ORAL | 3 refills | Status: DC

## 2016-04-23 MED ORDER — SIMVASTATIN 20 MG PO TABS: 20.0000 mg | ORAL_TABLET | Freq: Every day | ORAL | 3 refills | Status: DC

## 2016-04-23 MED ORDER — METOPROLOL TARTRATE 25 MG PO TABS: 25.0000 mg | ORAL_TABLET | Freq: Two times a day (BID) | ORAL | 3 refills | Status: DC

## 2016-04-23 MED ORDER — LISINOPRIL 10 MG PO TABS: 10.0000 mg | ORAL_TABLET | Freq: Every day | ORAL | 3 refills | Status: DC

## 2016-04-23 NOTE — Patient Instructions (Addendum)

## 2016-05-01 ENCOUNTER — Other Ambulatory Visit: Payer: Self-pay | Admitting: Cardiology

## 2016-09-25 ENCOUNTER — Other Ambulatory Visit: Payer: Self-pay | Admitting: Cardiothoracic Surgery

## 2016-09-25 DIAGNOSIS — I7100 Dissection of unspecified site of aorta: Secondary | ICD-10-CM

## 2016-11-06 ENCOUNTER — Inpatient Hospital Stay: Admission: RE | Admit: 2016-11-06 | Payer: Medicare Other | Source: Ambulatory Visit

## 2016-11-06 ENCOUNTER — Ambulatory Visit: Payer: Medicare Other | Admitting: Cardiothoracic Surgery

## 2016-12-11 ENCOUNTER — Encounter: Payer: Self-pay | Admitting: Cardiothoracic Surgery

## 2016-12-11 ENCOUNTER — Ambulatory Visit (INDEPENDENT_AMBULATORY_CARE_PROVIDER_SITE_OTHER): Payer: Medicare Other | Admitting: Cardiothoracic Surgery

## 2016-12-11 ENCOUNTER — Ambulatory Visit
Admission: RE | Admit: 2016-12-11 | Discharge: 2016-12-11 | Disposition: A | Discharge status: Home / Self Care | Payer: Medicare Other | Source: Ambulatory Visit | Attending: Cardiothoracic Surgery | Admitting: Cardiothoracic Surgery

## 2016-12-11 VITALS — BP 121/72 | HR 72 | Resp 16 | Ht 69.0 in | Wt 192.5 lb

## 2016-12-11 DIAGNOSIS — I7100 Dissection of unspecified site of aorta: Secondary | ICD-10-CM

## 2016-12-11 DIAGNOSIS — I7101 Dissection of thoracic aorta: Secondary | ICD-10-CM | POA: Diagnosis not present

## 2016-12-11 DIAGNOSIS — Z8679 Personal history of other diseases of the circulatory system: Secondary | ICD-10-CM

## 2016-12-11 MED ORDER — IOPAMIDOL (ISOVUE-370) INJECTION 76%
75.0000 mL | Freq: Once | INTRAVENOUS | Status: AC | PRN
  Administered 2016-12-11: 75 mL via INTRAVENOUS

## 2016-12-11 NOTE — Progress Notes (Signed)
[address]       [place] [address]             [phone]                ROBLEY DUTTON [facility] Medical Record #[id number] Date of Birth: 1955-11-19  Referring: Dr. Antoine Poche Primary Care: Frederica Kuster, MD  Chief Complaint:    Chief Complaint  Patient presents with  . 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 He denies any chest pain or anginal symptoms. He's had no overt symptoms of congestive heart failure. He continues to be a nonsmoker.  He notes that he was right after aortic dissection "  put the pack a cigarettes down have full and never had another one" .      Current Activity/ Functional Status: Patient is independent with mobility/ambulation, transfers, ADL's, IADL's.   Past Medical History:  Diagnosis Date  . Aortic dissection [facility]) June 10, 2010   2011 status post repair of type  aortic dissection and aortic valve resuspension  . Decreased left ventricular function June 10, 2010   EF of 40-45% w/ out regional wall motion abnormalities 2011  EF 60%echo 2014  . Hypertension   . Nephrolithiasis    has history; status post stone removal  . Tobacco abuse     Past Surgical History:  Procedure Laterality Date  . 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  06/10/10 Dr. Tyrone Sage    Family History  Problem Relation Age of Onset  . Lung disease Mother   . Kidney disease Sister   two sisters, one had brain aneurysm and was on dialysis died in her 61s His twin brother died of a myocardial infarction at age 33 Father died of myocardial infarction at age 58 Sister died at age 57 of an abdominal aortic aneurysm  Social History   Social History  . Marital status:  Legally Separated    Spouse name: N/A  . Number of children: N/A  . Years of education: N/A   Occupational History  . Not on file.   Social History Main Topics  . Smoking status: Former Smoker    Quit date: 08/11/2002  . Smokeless tobacco: Never Used  . Alcohol use No  . Drug use: No  . Sexual activity: Not on file   Other Topics Concern  . Not on file   Social History Narrative   Has 20-pack-year history of tobacco abuse, quitting many years ago. Denies alcohol or drugs. Does not routinely exercise     History  Smoking Status  . Former Smoker  . Quit date: 08/11/2002  Smokeless Tobacco  . Never Used    History  Alcohol Use No     Allergies  Allergen Reactions  . Codeine Nausea And Vomiting    Current Outpatient Prescriptions  Medication Sig Dispense Refill  . aspirin 325 MG tablet Take 325 mg by mouth daily.      Marland Kitchen lisinopril (PRINIVIL,ZESTRIL) 10 MG tablet Take 1 tablet (10 mg total) by mouth daily. 90 tablet 3  . metoprolol (LOPRESSOR) 100 MG tablet Take 1 tablet (100 mg total) by mouth 2 (  two) times daily. 180 tablet 3  . metoprolol tartrate (LOPRESSOR) 25 MG tablet Take 1 tablet (25 mg total) by mouth 2 (two) times daily. Takes in addition to the 100mg  twice a day. 180 tablet 3  . simvastatin (ZOCOR) 20 MG tablet Take 1 tablet (20 mg total) by mouth at bedtime. 90 tablet 3   No current facility-administered medications for this visit.        Review of Systems:     Cardiac Review of Systems: Y or N  Chest Pain [  n ]  Resting SOB [n] Exertional SOB  [n  ]  Orthopnea [n]   Pedal Edema [ n  ]    Palpitations [n  ] Syncope  [  n]   Presyncope [ n ]  General Review of Systems: [Y] = yes [  ]=no Constitional: recent weight change [  ]; anorexia [  ]; fatigue [  ]; nausea [  ]; night sweats [  ]; fever [  ]; or chills [  ];                                                                                                                                            Dental: poor dentition[ y ];  Eye : blurred vision [  ]; diplopia [   ]; vision changes [  ];  Amaurosis fugax[  ]; Resp: cough [  ];  wheezing[ n ];  hemoptysis[ n ]; shortness of breath[ n ]; paroxysmal nocturnal dyspnea[ n ]; dyspnea on exertion[  ]; or orthopnea[  ];  GI:  gallstones[  ], vomiting[  ];  dysphagia[  ]; melena[  ];  hematochezia [  ]; heartburn[  ];   Hx of  Colonoscopy[ no ]; GU: kidney stones [  ]; hematuria[  ];   dysuria [  ];  nocturia[  ];  history of     obstruction [  ];             Skin: rash, swelling[  ];, hair loss[  ];  peripheral edema[  ];  or itching[  ]; Musculosketetal: myalgias[  ];  joint swelling[  ];  joint erythema[  ];  joint pain[  ];  back pain[  ];  Heme/Lymph: bruising[  ];  bleeding[  ];  anemia[  ];  Neuro: TIA[ n ];  headaches[  ];  stroke[ n ];  vertigo[  ];  seizures[  ];   paresthesias[  ];  difficulty walking[  ];  Psych:depression[  ]; anxiety[  ];  Endocrine: diabetes[  ];  thyroid dysfunction[  ];  Immunizations: Flu [no ]; Pneumococcal[no  ];  Other:  Physical Exam: BP 121/72 (BP Location: Right Arm, Patient Position: Sitting, Cuff Size: Large)   Pulse 72   Resp 16   Ht 5\' 9"  (1.753 m)   Wt 192 lb  8 oz (87.3 kg)   SpO2 95% Comment: ON RA  BMI 28.43 kg/m   General appearance: alert, cooperative, appears stated age and no distress Neurologic: intact Heart: regular rate and rhythm, S1, S2 normal, no murmur I do not appreciate any murmur of aortic insufficiency, click, rub or gallop and normal apical impulse Lungs: clear to auscultation bilaterally and normal percussion bilaterally Abdomen: soft, non-tender; bowel sounds normal; no masses,  no organomegaly Extremities: extremities normal, atraumatic, no cyanosis or edema and Homans sign is negative, no sign of DVT Patient has full and equal radial brachial and pedal pulses  Diagnostic Studies & Laboratory data:     Recent Radiology Findings:  Ct Angio Chest Aorta W/cm &/or  Wo/cm  Result Date: 12/11/2016 CLINICAL DATA:  Aortic dissection. EXAM: CT ANGIOGRAPHY CHEST WITH CONTRAST TECHNIQUE: Multidetector CT imaging of the chest was performed using the standard protocol during bolus administration of intravenous contrast. Multiplanar CT image reconstructions and MIPs were obtained to evaluate the vascular anatomy. CONTRAST:  75 mL of Isovue 370 intravenously. COMPARISON:  CT scan of May 10, 2015. FINDINGS: Cardiovascular: Status post surgical repair of ascending thoracic aortic dissection. Stable small dissection flap is seen just above aortic root and proximal ascending thoracic aorta. Stable dissection is seen beginning at the distal anastomosis of the surgical graft. The dissection flap is seen to extend into the right innominate artery again, which is perfused by both the true and false lumen. The dissection is seen to extend through the aortic arch and descending thoracic aorta into the abdominal aorta. The left common carotid and subclavian arteries are seen to arise from the true lumen and are free of significant stenosis. Mediastinum/Nodes: No enlarged mediastinal, hilar, or axillary lymph nodes. Thyroid gland, trachea, and esophagus demonstrate no significant findings. Lungs/Pleura: Lungs are clear. No pleural effusion or pneumothorax. Upper Abdomen: The thoracic aortic dissection extends into the visualized portion of proximal abdominal aorta. The celiac, superior mesenteric and left renal arteries appear to arise from the true lumen. Dissection flap extends into the proximal right renal artery, which appears to be perfused by both the true and false lumens. Symmetric enhancement of both kidneys is noted. Musculoskeletal: No chest wall abnormality. No acute or significant osseous findings. Review of the MIP images confirms the above findings. IMPRESSION: Status post surgical repair of ascending thoracic aortic dissection. Small residual dissection is noted in proximal  ascending thoracic aorta just above aortic root. Stable dissection is seen beginning at the distal anastomosis of the surgical graft which extends through aortic arch and descending thoracic aorta and into visualized portion of proximal abdominal aorta. Dissection flap is again noted to extend into the origin of the right innominate artery, as well as into the proximal portion of the right renal artery. Electronically Signed   By: Lupita Raider, M.D.   On: 12/11/2016 14:23   Ct Angio Chest W/cm &/or Wo Cm  05/10/2015   CLINICAL DATA:  Followup aortic dissection.  EXAM: CT ANGIOGRAPHY CHEST WITH CONTRAST  TECHNIQUE: Multidetector CT imaging of the chest was performed using the standard protocol during bolus administration of intravenous contrast. Multiplanar CT image reconstructions and MIPs were obtained to evaluate the vascular anatomy.  CONTRAST:  75 cc Isovue 370  COMPARISON:  CT scan 04/20/2014  FINDINGS: Mediastinum/Nodes: The chest wall is unremarkable. No chest wall masses, supraclavicular or axillary adenopathy. Small scattered lymph nodes are noted and are stable. The thyroid gland is normal.  The heart is normal in size.  No pericardial effusion. Stable coronary artery calcifications. There are small scattered mediastinal and hilar lymph nodes but no mass or overt adenopathy. The esophagus is grossly normal.  Aorta:  Surgical changes of prior type a aortic dissection repair. As was demonstrated on prior there is persisting dissection flap extending from the aortic root repair. The flap again is seen to extend through the aortic arch and the length of descending aorta and visualized abdominal aorta Greatest diameter of the transverse arch measures 4.2 cm, unchanged from prior.  Again the dissection flap extends into the innominate artery. The innominate artery is perfused both by the true lumen and false lumen. The left common carotid artery and left subclavian artery are perfused from the true lumen.   Lungs/Pleura: No acute pulmonary findings. No pulmonary lesions or pleural effusion. The tracheobronchial tree is unremarkable.  Upper abdomen: The dissection courses down below the renal arteries. The aortic branch vessels are patent.  Stable left hepatic lobe cyst. The pancreas and spleen are normal and stable. The adrenal glands are normal. Stable left renal cysts. A retroaortic left renal vein is noted.  Musculoskeletal: No significant osseous findings.  Review of the MIP images confirms the above findings.  IMPRESSION: Stable CT appearance of the thoracic and aortic dissection. No complicating features are demonstrated.  No acute pulmonary findings.  Stable upper abdomen.   Electronically Signed   By: Rudie Meyer M.D.   On: 05/10/2015 13:27   Ct Angio Chest Aorta W/cm &/or Wo/cm  04/20/2014   CLINICAL DATA:  61 year old male with a history of previous type A aortic dissection. Surgical repair was October 2011.  EXAM: CTA ABDOMEN AND PELVIS wITHOUT AND WITH CONTRAST  TECHNIQUE: Multidetector CT imaging of the abdomen and pelvis was performed using the standard protocol during bolus administration of intravenous contrast. Multiplanar reconstructed images and MIPs were obtained and reviewed to evaluate the vascular anatomy.  CONTRAST:  80mL OMNIPAQUE IOHEXOL 350 MG/ML SOLN  COMPARISON:  Multiple prior CT for comparison. Most recent CT 02/10/2013  FINDINGS: Nonvascular:  Chest:  Unremarkable appearance of the soft tissues of the chest. No supraclavicular or axillary adenopathy. Surgical changes of prior median sternotomy. Surgical changes also present within the right supraclavicular region.  Unremarkable appearance of the visualized thyroid.  Central airways patent.  No mediastinal adenopathy.  Heart size within normal limits.  Surgical changes of previous type a aortic dissection.  No confluent airspace disease or pneumothorax. No pleural effusion. Minimal atelectasis at the bilateral bases. No pulmonary  nodules identified.  Abdomen/pelvis:  Low-density lesion within segment 4 a (image 92 series 4), which is larger than on the comparison CT, and in fact has enlarged to double the size of the lesion on the first available comparison 12/14/2006. In 2008 lesion measured approximately 8 mm and is now 16 mm.  There are additional small hypodensities measuring a few mm in segment 4B (image 119) as well as in segment 3 (image 108).  No intrahepatic or extrahepatic biliary ductal dilatation.  No evidence of radiopaque gallstones with no pericholecystic fluid or inflammatory changes. No peripancreatic fluid or inflammatory changes.  Unremarkable appearance of the bilateral adrenal glands. Unremarkable appearance of the spleen.  No abnormally distended small bowel or colon. Normal appendix. There are a few colonic diverticula with no evidence of associated inflammatory changes.  Unremarkable appearance of the right kidney with no hydronephrosis or nephrolithiasis. Unremarkable right ureter. No left-sided hydronephrosis or nephrolithiasis. Exophytic cyst of the left kidney measures 5.2 cm, unchanged  from prior. Additional small cyst interpolar region measures 18 mm.  No peritoneal lymphadenopathy.  Small portacaval lymph node.  Unremarkable appearance of the urinary bladder. Transverse diameter of prostate measures nearly 5 cm.  Vascular:  Aorta:  Surgical changes of prior type a aortic dissection repair. As was demonstrated on prior there is persisting dissection flap extending from the aortic root repair. The flap again is seen to extend through the aortic arch and the length of descending aorta through the bifurcation.  Greatest diameter of the transverse arch measures 4.2 cm, unchanged from prior.  Again the dissection flap extends into the innominate artery. The innominate artery is perfused both by the true lumen and false lumen. The left common carotid artery and left subclavian artery are perfused from the true lumen.   True lumen perfuses the celiac artery superior mesenteric artery, and inferior mesenteric artery.  Dissection flap extends into the origin of the right renal artery which is perfused by both lumen and remains patent.  Two separate left renal artery, each appear perfused via the true lumen.  The iliac arteries:  The dissection flap extends through the aortic bifurcation into the bilateral common iliac arteries. On the right, the dissection flap terminates at the origin of the hypogastric artery. Hypogastric artery appears perfused via the true lumen, although the flap may extend into the epigastric artery.  On the left the dissection flap extends into the hypogastric artery which remains perfused. The left external iliac artery is of normal caliber and remains perfused.  Venous:  Unremarkable appearance of the portal venous system. Retro aortic left renal vein.  Review of the MIP images confirms the above findings.  IMPRESSION: Re-demonstration of surgically repaired type A aortic dissection. The greatest diameter of the aorta and configuration of the dissection flap are unchanged, with flap extending from the aortic root repair into the bilateral iliac arteries, as above. Perfusion is maintained within the branch vessels of the aortic arch and of the mesenteric vessels.  Low-density lesion of the left hepatic lobe has slowly increased in size from first comparison CT in 2008. Further evaluation with a liver protocol MRI may be warranted, particularly if the patient has a history of a known malignancy.  These results will be called to the ordering clinician or representative by the Radiologist Assistant, and communication documented in the PACS or zVision Dashboard.  Signed,  Yvone Neu. Loreta Ave, DO  [facility]  [facility]   Electronically Signed   By: Gilmer Mor O.D.   On: 04/20/2014 15:37      Ct Angio Chest W/cm &/or Wo Cm  02/10/2013   *RADIOLOGY REPORT*  Clinical  Data:  Follow up aortic dissection. History of type 1 aortic dissection and status post replacement of the ascending thoracic aorta and resuspension of the aortic valve.  CT ANGIOGRAPHY CHEST, ABDOMEN AND PELVIS  Technique:  Multidetector CT imaging through the chest, abdomen and pelvis was performed using the standard protocol during bolus administration of intravenous contrast.  Multiplanar reconstructed images including MIPs were obtained and reviewed to evaluate the vascular anatomy.  Contrast: 80mL OMNIPAQUE IOHEXOL 350 MG/ML SOLN,  Comparison:  12/25/2011  CTA CHEST  Findings:  Again noted are postsurgical changes consistent with replacement of the ascending thoracic aorta.  The ascending thoracic aorta is patent.  There is a dissection involving the aortic arch that extends in the right innominate artery and terminates at the innominate artery bifurcation. The cephalad extent of the dissection is  unchanged.  There is flow in the great vessels.  Posterior aortic arch roughly measures 4.3 cm in diameter and not significantly changed.  There is slightly increased thrombus within the posterior false lumen of the descending thoracic aorta.  The thoracic aorta at the hiatus measures 3.1 cm unchanged.  No significant pericardial or pleural fluid.  A precarinal lymph node roughly measures 0.9 cm in short axis and minimally changed. Overall, there is no significant chest lymphadenopathy.  The trachea and mainstem bronchi are patent.  Stable pleural-based scarring at the posterior right lung base.  A small area of pleural thickening along the right major fissure on image 33, sequence #5. There is a stable 5 mm pleural based nodule in the posterior right upper lobe on image 15, sequence five.  This is probably unchanged since 06/10/2010.   No focal consolidation or parenchymal disease in the lungs.  No acute bony abnormality.   Review of the MIP images confirms the above findings.  IMPRESSION: No significant change in  the thoracic aortic dissection.  There is slightly increased mural thrombus within the false lumen.  No significant change in the appearance of the ascending aorta replacement.  Stable pleural based nodules in the lungs as described.  Recommend attention to these on followup imaging.  CTA ABDOMEN AND PELVIS  Findings:  The aortic dissection extends into the abdominal aorta and terminates in the proximal right external iliac artery and proximal left internal iliac artery.  The configuration and extent of the dissection is unchanged.  There is no significant aneurysmal dilatation of the aorta.  The abdominal aorta roughly measures up to 2.8 cm at the level of the celiac trunk which is unchanged.  The celiac trunk, superior mesenteric artery and both left renal arteries originate from the anterior lumen, which is probably the true lumen.  Dissection extends into the right renal artery. Question a small fenestration of the dissection on image 138.  The IMA originates from the anterior lumen.  Iliac arteries and proximal femoral arteries are patent bilaterally.  Stable low density structure in the left hepatic lobe roughly measures 1.4 cm.  No gross abnormality to the liver, gallbladder, spleen, pancreas or adrenal glands.  4.6 cm cyst involving left kidney upper pole.  There is a smaller cyst in the left kidney mid pole.  Incidentally, there is a retroaortic left renal vein. Normal appearance of the appendix.  No gross abnormality prostate, seminal vesicles and urinary bladder.  No gross abnormality of the small or large bowel.  No acute bony abnormality.   Review of the MIP images confirms the above findings.  IMPRESSION: Stable appearance of the aortic dissection.  No acute abnormalities within the abdomen or pelvis.  Left renal cysts.   Original Report Authenticated By: Richarda Overlie, M.D.    Recent Lab Findings: Lab Results  Component Value Date   WBC 7.8 02/19/2016   HGB 9.3 (L) 06/14/2010   HCT 43.3 02/19/2016    PLT 166 02/19/2016   GLUCOSE 96 02/19/2016   CHOL 132 02/19/2016   TRIG 130 02/19/2016   HDL 40 02/19/2016   LDLCALC 66 02/19/2016   ALT 14 02/19/2016   AST 15 02/19/2016   NA 142 02/19/2016   K 5.2 02/19/2016   CL 101 02/19/2016   CREATININE 1.53 (H) 02/19/2016   BUN 21 02/19/2016   CO2 25 02/19/2016   TSH 2.370 02/19/2016   INR 0.82 06/10/2010    Assessment / Plan:   Patient returns today in  followup after repair of ascending aorta dissection and resuspension of his aortic valve for acute aortic dissection in October 2011. He has a persistent false lumen in the  arch and descending thoracic aorta, followup CT scans shows stable situation compared to previous scans We'll plan to see him back in 18 months  with a repeat CTA of the chest. Last echocardiogram was 2014- he has no murmur of ai on exam   Delight Ovens MD  Beeper [phone] Office [phone] 12/11/2016 2:57 PM

## 2017-01-07 ENCOUNTER — Other Ambulatory Visit: Payer: Self-pay | Admitting: *Deleted

## 2017-01-07 MED ORDER — LISINOPRIL 10 MG PO TABS: 10.0000 mg | ORAL_TABLET | Freq: Every day | ORAL | 0 refills | Status: DC

## 2017-01-07 MED ORDER — SIMVASTATIN 20 MG PO TABS: 20.0000 mg | ORAL_TABLET | Freq: Every day | ORAL | 3 refills | Status: DC

## 2017-01-07 MED ORDER — METOPROLOL TARTRATE 25 MG PO TABS
25.0000 mg | ORAL_TABLET | Freq: Two times a day (BID) | ORAL | 3 refills | Status: DC
Start: 2017-01-07 — End: 2017-04-01

## 2017-01-15 ENCOUNTER — Other Ambulatory Visit: Payer: Self-pay | Admitting: Cardiology

## 2017-04-01 ENCOUNTER — Other Ambulatory Visit: Payer: Self-pay | Admitting: Family Medicine

## 2017-04-02 MED ORDER — METOPROLOL TARTRATE 25 MG PO TABS: 25.0000 mg | ORAL_TABLET | Freq: Two times a day (BID) | ORAL | 1 refills | Status: DC

## 2017-04-06 ENCOUNTER — Other Ambulatory Visit: Payer: Self-pay | Admitting: Cardiology

## 2017-04-06 MED ORDER — METOPROLOL TARTRATE 25 MG PO TABS: 25.0000 mg | ORAL_TABLET | Freq: Two times a day (BID) | ORAL | 0 refills | Status: DC

## 2017-04-06 NOTE — Telephone Encounter (Signed)
New message   *STAT* If patient is at the pharmacy, call can be transferred to refill team.   1. Which medications need to be refilled? (please list name of each medication and dose if known) metoprolol 25mg    2. Which pharmacy/location (including street and city if local pharmacy) is medication to be sent to? [facility]  3. Do they need a 30 day or 90 day supply? 90

## 2017-04-06 NOTE — Telephone Encounter (Signed)
Rx(s) sent to pharmacy electronically.  

## 2017-04-07 ENCOUNTER — Other Ambulatory Visit: Payer: Self-pay | Admitting: Cardiology

## 2017-04-07 MED ORDER — METOPROLOL TARTRATE 100 MG PO TABS: 100.0000 mg | ORAL_TABLET | Freq: Two times a day (BID) | ORAL | 0 refills | Status: DC

## 2017-04-07 NOTE — Telephone Encounter (Signed)
Rx(s) sent to pharmacy electronically.  

## 2017-04-07 NOTE — Telephone Encounter (Signed)
New message    *STAT* If patient is at the pharmacy, call can be transferred to refill team.   1. Which medications need to be refilled? (please list name of each medication and dose if known)  metoprolol tartrate (LOPRESSOR) 100 mg  2. Which pharmacy/location (including street and city if local pharmacy) is medication to be sent to?  [facility]  3. Do they need a 30 day or 90 day supply?  90

## 2017-06-02 ENCOUNTER — Encounter: Payer: Self-pay | Admitting: Cardiology

## 2017-06-04 ENCOUNTER — Other Ambulatory Visit: Payer: Self-pay | Admitting: Cardiothoracic Surgery

## 2017-06-04 DIAGNOSIS — I7101 Dissection of thoracic aorta: Secondary | ICD-10-CM

## 2017-06-04 DIAGNOSIS — I71019 Dissection of thoracic aorta, unspecified: Secondary | ICD-10-CM

## 2017-06-14 NOTE — Progress Notes (Signed)
HPI The patient presents for followup of hypertension and his aortic dissection. CT scan done 04/2015 demonstrates a stable thoracic repair.  He is followed routinely by Dr. Tyrone Sage.  He had a follow up  CT in May. He walks for exercise.  He does well with this. The patient denies any new symptoms such as chest discomfort, neck or arm discomfort. There has been no new shortness of breath, PND or orthopnea. There have been no reported palpitations, presyncope or syncope.   Allergies  Allergen Reactions  . Codeine Nausea And Vomiting    Current Outpatient Medications  Medication Sig Dispense Refill  . aspirin 325 MG tablet Take 325 mg by mouth daily.      Marland Kitchen lisinopril (PRINIVIL,ZESTRIL) 10 MG tablet Take 1 tablet (10 mg total) daily by mouth. 90 tablet 3  . metoprolol tartrate (LOPRESSOR) 100 MG tablet Take 1 tablet (100 mg total) 2 (two) times daily by mouth. In addition to 25mg  twice daily 180 tablet 3  . metoprolol tartrate (LOPRESSOR) 25 MG tablet Take 1 tablet (25 mg total) 2 (two) times daily by mouth. Takes in addition to the 100mg  twice a day. 180 tablet 3  . simvastatin (ZOCOR) 20 MG tablet Take 1 tablet (20 mg total) at bedtime by mouth. 90 tablet 3   No current facility-administered medications for this visit.     Past Medical History:  Diagnosis Date  . Aortic dissection [facility]) June 10, 2010   2011 status post repair of type  aortic dissection and aortic valve resuspension  . Decreased left ventricular function June 10, 2010   EF of 40-45% w/ out regional wall motion abnormalities 2011  EF 60%echo 2014  . Hypertension   . Nephrolithiasis    has history; status post stone removal  . Tobacco abuse     ROS:  As stated in the HPI and negative for all other systems.  PHYSICAL EXAM BP 124/78   Pulse 70   Ht 5\' 9"  (1.753 m)   Wt 198 lb (89.8 kg)   BMI 29.24 kg/m   GENERAL:  Well appearing HEENT: Left corneal opacification, fundi not visualized, poor  dentition. NECK:  No jugular venous distention, waveform within normal limits, carotid upstroke brisk and symmetric, no bruits, no thyromegaly LUNGS:  Clear to auscultation bilaterally CHEST:  Well healed sternotomy scar. HEART:  PMI not displaced or sustained,S1 and S2 within normal limits, no S3, no S4, no clicks, no rubs, brief right upper sternal border murmur, no diastolic murmurs ABD:  Flat, positive bowel sounds normal in frequency in pitch, no bruits, no rebound, no guarding, no midline pulsatile mass, no hepatomegaly, no splenomegaly EXT:  2 plus pulses throughout, no edema, no cyanosis no clubbing    Lab Results  Component Value Date   CHOL 132 02/19/2016   TRIG 130 02/19/2016   HDL 40 02/19/2016   LDLCALC 66 02/19/2016    EKG:  Sinus rhythm, rate 71 axis within normal limits, intervals within normal limits, no acute ST-T wave changes.  06/17/2017  ASSESSMENT AND PLAN  AORTIC DISSECTION REPAIRED:  This was stable and he is to see Dr. Tyrone Sage in another 18 months with plans for a CTA.  No change in therapy is indicated.   HTN:  His BP is well controlled.  He will continue meds as listed.   CARDIOMYOPATHY:  His last ejection fraction was 40%. This was in 2011. However it was improved to 60% and the echo in 2014.  No further  imaging is planned.   DYSLIPIDEMIA:  His LDL was done last year.  I will repeat a lipid profile.

## 2017-06-17 ENCOUNTER — Ambulatory Visit (INDEPENDENT_AMBULATORY_CARE_PROVIDER_SITE_OTHER): Payer: Medicare Other | Admitting: Cardiology

## 2017-06-17 ENCOUNTER — Encounter: Payer: Self-pay | Admitting: Cardiology

## 2017-06-17 VITALS — BP 124/78 | HR 70 | Ht 69.0 in | Wt 198.0 lb

## 2017-06-17 DIAGNOSIS — Z79899 Other long term (current) drug therapy: Secondary | ICD-10-CM | POA: Diagnosis not present

## 2017-06-17 DIAGNOSIS — I7101 Dissection of thoracic aorta: Secondary | ICD-10-CM

## 2017-06-17 DIAGNOSIS — E7800 Pure hypercholesterolemia, unspecified: Secondary | ICD-10-CM | POA: Diagnosis not present

## 2017-06-17 DIAGNOSIS — I10 Essential (primary) hypertension: Secondary | ICD-10-CM

## 2017-06-17 DIAGNOSIS — I71019 Dissection of thoracic aorta, unspecified: Secondary | ICD-10-CM

## 2017-06-17 MED ORDER — METOPROLOL TARTRATE 25 MG PO TABS: 25.0000 mg | ORAL_TABLET | Freq: Two times a day (BID) | ORAL | 3 refills | Status: DC

## 2017-06-17 MED ORDER — METOPROLOL TARTRATE 100 MG PO TABS: 100.0000 mg | ORAL_TABLET | Freq: Two times a day (BID) | ORAL | 3 refills | Status: DC

## 2017-06-17 MED ORDER — LISINOPRIL 10 MG PO TABS: 10.0000 mg | ORAL_TABLET | Freq: Every day | ORAL | 3 refills | Status: DC

## 2017-06-17 MED ORDER — SIMVASTATIN 20 MG PO TABS: 20.0000 mg | ORAL_TABLET | Freq: Every day | ORAL | 3 refills | Status: DC

## 2017-06-17 NOTE — Patient Instructions (Signed)
Medication Instructions:  The current medical regimen is effective;  continue present plan and medications.  Labwork: [email or web address] (Lipid)  Follow-Up: Follow up in 1 year with Dr. Antoine [place]Hochrein in [place].  You will receive a letter in the mail 2 months before you are due.  Please call us when you receive this letter to schedule your follow up appointment.  If you need a refill on your cardiac medications before your next appointment, please call your pharmacy.  Thank you for choosing [facility]!!

## 2017-06-18 ENCOUNTER — Ambulatory Visit: Payer: Medicare Other | Admitting: Cardiothoracic Surgery

## 2017-06-18 ENCOUNTER — Other Ambulatory Visit: Payer: Medicare Other

## 2017-09-15 ENCOUNTER — Other Ambulatory Visit: Payer: Self-pay | Admitting: Cardiology

## 2017-09-15 MED ORDER — METOPROLOL TARTRATE 100 MG PO TABS: 100.0000 mg | ORAL_TABLET | Freq: Two times a day (BID) | ORAL | 3 refills | Status: DC

## 2017-09-15 MED ORDER — METOPROLOL TARTRATE 25 MG PO TABS: 25.0000 mg | ORAL_TABLET | Freq: Two times a day (BID) | ORAL | 3 refills | Status: DC

## 2017-09-15 NOTE — Telephone Encounter (Signed)
°*  STAT* If patient is at the pharmacy, call can be transferred to refill team.   1. Which medications need to be refilled? (please list name of each medication and dose if known) need a new prescription for Metoprolol 100mg  2. Which pharmacy/location (including street and city if local pharmacy) is medication to be sent to?[phone]  3. Do they need a 30 day or 90 day supply?180 and refills

## 2017-11-24 ENCOUNTER — Ambulatory Visit (INDEPENDENT_AMBULATORY_CARE_PROVIDER_SITE_OTHER): Payer: Medicare Other | Admitting: *Deleted

## 2017-11-24 VITALS — BP 123/76 | HR 70 | Ht 69.0 in | Wt 200.0 lb

## 2017-11-24 DIAGNOSIS — Z0000 Encounter for general adult medical examination without abnormal findings: Secondary | ICD-10-CM | POA: Diagnosis not present

## 2017-11-24 NOTE — Progress Notes (Addendum)
Subjective:   Nicholas Palmer is a 62 y.o. male who presents for an Initial Medicare Annual Wellness Visit. [name] previously worked at [employer].  He walks about 2 miles daily and only eats 1 meal daily.  [name] lives at home alone but visits ex wife frequently.  Overall [name] feels that his health is the same as it was a year ago   Objective:    Today's Vitals   11/24/17 1042  BP: 123/76  Pulse: 70  Weight: 200 lb (90.7 kg)  Height: 5\' 9"  (1.753 m)   Body mass index is 29.53 kg/m.  No flowsheet data found.  Current Medications (verified) Outpatient Encounter Medications as of 11/24/2017  Medication Sig  . aspirin 325 MG tablet Take 325 mg by mouth daily.    Marland Kitchen. lisinopril (PRINIVIL,ZESTRIL) 10 MG tablet Take 1 tablet (10 mg total) daily by mouth.  . metoprolol tartrate (LOPRESSOR) 100 MG tablet Take 1 tablet (100 mg total) by mouth 2 (two) times daily. In addition to 25mg  twice daily  . metoprolol tartrate (LOPRESSOR) 25 MG tablet Take 1 tablet (25 mg total) by mouth 2 (two) times daily. Takes in addition to the 100mg  twice a day.  . simvastatin (ZOCOR) 20 MG tablet Take 1 tablet (20 mg total) at bedtime by mouth.   No facility-administered encounter medications on file as of 11/24/2017.     Allergies (verified) Codeine   History: Past Medical History:  Diagnosis Date  . Aortic dissection [facility]) June 10, 2010   2011 status post repair of type  aortic dissection and aortic valve resuspension  . Decreased left ventricular function June 10, 2010   EF of 40-45% w/ out regional wall motion abnormalities 2011  EF 60%echo 2014  . Hypertension   . Nephrolithiasis    has history; status post stone removal  . Tobacco abuse    Past Surgical History:  Procedure Laterality Date  . 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  06/10/10 Dr. Tyrone SageGerhardt   Family History  Problem  Relation Age of Onset  . Lung disease Mother   . Kidney disease Sister    Social History   Socioeconomic History  . Marital status: Legally Separated    Spouse name: Not on file  . Number of children: Not on file  . Years of education: Not on file  . Highest education level: Not on file  Occupational History  . Not on file  Social Needs  . Financial resource strain: Not hard at all  . Food insecurity:    Worry: Never true    Inability: Never true  . Transportation needs:    Medical: No    Non-medical: No  Tobacco Use  . Smoking status: Former Smoker    Last attempt to quit: 08/11/2002    Years since quitting: 15.2  . Smokeless tobacco: Never Used  Substance and Sexual Activity  . Alcohol use: No    Alcohol/week: 0.0 oz  . Drug use: No  . Sexual activity: Not on file  Lifestyle  . Physical activity:    Days per week: 7 days    Minutes per session: 30 min  . Stress: Not at all  Relationships  . Social connections:    Talks on phone: Three times a week    Gets together: Three times a week    Attends religious service: Never    Active  member of club or organization: No    Attends meetings of clubs or organizations: Never    Relationship status: Separated  Other Topics Concern  . Not on file  Social History Narrative   Has 20-pack-year history of tobacco abuse, quitting many years ago. Denies alcohol or drugs. Does not routinely exercise    Tobacco Counseling No tobacco use at this time   Activities of Daily Living In your present state of health, do you have any difficulty performing the following activities: 11/24/2017  Hearing? N  Vision? Y  Difficulty concentrating or making decisions? N  Walking or climbing stairs? N  Dressing or bathing? N  Doing errands, shopping? 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 Health Maintenance Due  Topic Date Due  . Hepatitis C Screening  19-Dec-1955  . HIV Screening  10/16/1969    Patient Care Team: Frederica Kuster, MD as PCP - General (Family Medicine) Rollene Rotunda, MD as Attending Physician (Cardiology)  Indicate any recent Medical Services you may have received from other than [facility] providers in the past year (date may be approximate).    Assessment:   This is a routine wellness examination for Nicholas Palmer.  Hearing/Vision screen  Dietary issues and exercise activities discussed: Current Exercise Habits: Home exercise routine, Type of exercise: walking, Time (Minutes): 30, Frequency (Times/Week): 7, Weekly Exercise (Minutes/Week): 210, Intensity: Mild  Goals    None     Depression Screen PHQ 2/9 Scores 11/24/2017 02/19/2016 04/03/2015  PHQ - 2 Score 0 0 0    Fall Risk Fall Risk  11/24/2017 02/19/2016 04/03/2015  Falls in the past year? No No No    Is the patient's home free of loose throw rugs in walkways, pet beds, electrical cords, etc?   yes      Grab bars in the bathroom? yes      Handrails on the stairs?   yes      Adequate lighting?   yes  Timed Get Up and Go performed:   Cognitive Function: MMSE - Mini Mental State Exam 11/24/2017  Orientation to time 5  Orientation to Place 5  Registration 3  Attention/ Calculation 5  Recall 3  Language- name 2 objects 2  Language- repeat 1  Language- follow 3 step command 3  Language- read & follow direction 1  Write a sentence 1  Copy design 1  Total score 30  No memory loss noted      Screening Tests Health Maintenance  Topic Date Due  . Hepatitis C Screening  30-Aug-1955  . HIV Screening  10/16/1969  . COLONOSCOPY  11/25/2018 (Originally 10/16/2004)  . TETANUS/TDAP  11/25/2018 (Originally 10/16/1973)  . INFLUENZA VACCINE  03/11/2018  Declines Tdap, prevnar, and colonoscopy at this time  Qualifies for Shingles Vaccine? Declined  Cancer Screenings: Lung: Low Dose CT Chest  recommended if Age 52-80 years, 30 pack-year currently smoking OR have quit w/in 15years. Patient does not qualify. Colorectal: Declined  Additional Screenings:  Hepatitis C Screening: Will discuss at follow up appt      Plan:   Encouraged [name] to eat 3 meals daily that consist of lean proteins, fruits and vegetables.  Encouraged to continue to exercise for at least 30 minutes 3 times weekly.  Explained the importance of tdap, prevnar and colonscopy.  Encouraged to  keep appointment with Dr. Louanne Skye for an Establish Care Visit and to keep all other follow up appointment with specialist.   I have personally reviewed and noted the following in the patient's chart:   . Medical and social history . Use of alcohol, tobacco or illicit drugs  . Current medications and supplements . Functional ability and status . Nutritional status . Physical activity . Advanced directives . List of other physicians . Hospitalizations, surgeries, and ER visits in previous 12 months . Vitals . Screenings to include cognitive, depression, and falls . Referrals and appointments  In addition, I have reviewed and discussed with patient certain preventive protocols, quality metrics, and best practice recommendations. A written personalized care plan for preventive services as well as general preventive health recommendations were provided to patient.     Caryl Bis, LPN   1/61/0960    I have reviewed and agree with the above AWV documentation.   Murtis Sink, MD [facility] 11/25/2017, 11:57 AM

## 2017-11-24 NOTE — Patient Instructions (Addendum)
Eat 3 meals daily that consist of lean proteins, fruits, and vegetables  [name] , Thank you for taking time to come for your Medicare Wellness Visit. I appreciate your ongoing commitment to your health goals. Please review the following plan we discussed and let me know if I can assist you in the future.   These are the goals we discussed: Goals    None      This is a list of the screening recommended for you and due dates:  Health Maintenance  Topic Date Due  .  Hepatitis C: One time screening is recommended by Center for Disease Control  (CDC) for  adults born from [birth year] through 1966.   [birth year]  . HIV Screening  10/16/1969  . Colon Cancer Screening  11/25/2018*  . Tetanus Vaccine  11/25/2018*  . Flu Shot  03/11/2018  *Topic was postponed. The date shown is not the original due date.  Keep Follow up appt with Dr. Louanne Skyeettinger

## 2017-12-23 ENCOUNTER — Ambulatory Visit (INDEPENDENT_AMBULATORY_CARE_PROVIDER_SITE_OTHER): Payer: Medicare Other | Admitting: Family Medicine

## 2017-12-23 ENCOUNTER — Encounter: Payer: Self-pay | Admitting: Family Medicine

## 2017-12-23 VITALS — BP 123/73 | HR 72 | Temp 97.4°F | Ht 69.0 in | Wt 197.0 lb

## 2017-12-23 DIAGNOSIS — Z9889 Other specified postprocedural states: Secondary | ICD-10-CM

## 2017-12-23 DIAGNOSIS — I10 Essential (primary) hypertension: Secondary | ICD-10-CM

## 2017-12-23 DIAGNOSIS — E7800 Pure hypercholesterolemia, unspecified: Secondary | ICD-10-CM | POA: Diagnosis not present

## 2017-12-23 DIAGNOSIS — J40 Bronchitis, not specified as acute or chronic: Secondary | ICD-10-CM | POA: Diagnosis not present

## 2017-12-23 LAB — CBC WITH DIFFERENTIAL/PLATELET
BASOS ABS: 0 10*3/uL (ref 0.0–0.2)
BASOS: 0 %
EOS (ABSOLUTE): 0.1 10*3/uL (ref 0.0–0.4)
Eos: 1 %
HEMOGLOBIN: 13.9 g/dL (ref 13.0–17.7)
Hematocrit: 41.8 % (ref 37.5–51.0)
IMMATURE GRANS (ABS): 0 10*3/uL (ref 0.0–0.1)
IMMATURE GRANULOCYTES: 0 %
LYMPHS: 28 %
Lymphocytes Absolute: 2 10*3/uL (ref 0.7–3.1)
MCH: 29.2 pg (ref 26.6–33.0)
MCHC: 33.3 g/dL (ref 31.5–35.7)
MCV: 88 fL (ref 79–97)
MONOCYTES: 7 %
Monocytes Absolute: 0.5 10*3/uL (ref 0.1–0.9)
Neutrophils Absolute: 4.6 10*3/uL (ref 1.4–7.0)
Neutrophils: 64 %
PLATELETS: 174 10*3/uL (ref 150–379)
RBC: 4.76 x10E6/uL (ref 4.14–5.80)
RDW: 13.1 % (ref 12.3–15.4)
WBC: 7.2 10*3/uL (ref 3.4–10.8)

## 2017-12-23 LAB — CMP14+EGFR
ALBUMIN: 4.4 g/dL (ref 3.6–4.8)
ALT: 18 IU/L (ref 0–44)
AST: 20 IU/L (ref 0–40)
Albumin/Globulin Ratio: 1.8 (ref 1.2–2.2)
Alkaline Phosphatase: 46 IU/L (ref 39–117)
BUN/Creatinine Ratio: 11 (ref 10–24)
BUN: 17 mg/dL (ref 8–27)
Bilirubin Total: 0.5 mg/dL (ref 0.0–1.2)
CALCIUM: 8.9 mg/dL (ref 8.6–10.2)
CO2: 23 mmol/L (ref 20–29)
CREATININE: 1.54 mg/dL — AB (ref 0.76–1.27)
Chloride: 100 mmol/L (ref 96–106)
GFR calc non Af Amer: 47 mL/min/{1.73_m2} — ABNORMAL LOW (ref >59)
GFR, EST AFRICAN AMERICAN: 55 mL/min/{1.73_m2} — AB (ref >59)
GLUCOSE: 79 mg/dL (ref 65–99)
Globulin, Total: 2.4 g/dL (ref 1.5–4.5)
Potassium: 4.3 mmol/L (ref 3.5–5.2)
Sodium: 137 mmol/L (ref 134–144)
TOTAL PROTEIN: 6.8 g/dL (ref 6.0–8.5)

## 2017-12-23 LAB — LIPID PANEL
CHOL/HDL RATIO: 3.8 ratio (ref 0.0–5.0)
Cholesterol, Total: 125 mg/dL (ref 100–199)
HDL: 33 mg/dL — AB (ref >39)
LDL CALC: 72 mg/dL (ref 0–99)
Triglycerides: 100 mg/dL (ref 0–149)
VLDL CHOLESTEROL CAL: 20 mg/dL (ref 5–40)

## 2017-12-23 MED ORDER — AZITHROMYCIN 250 MG PO TABS: ORAL_TABLET | ORAL | 0 refills | Status: DC

## 2017-12-23 MED ORDER — ALBUTEROL SULFATE HFA 108 (90 BASE) MCG/ACT IN AERS: 2.0000 | INHALATION_SPRAY | Freq: Four times a day (QID) | RESPIRATORY_TRACT | 0 refills | Status: DC | PRN

## 2017-12-23 NOTE — Progress Notes (Signed)
BP 123/73   Pulse 72   Temp (!) 97.4 F (36.3 C) (Oral)   Ht 5' 9"  (1.753 m)   Wt 197 lb (89.4 kg)   BMI 29.09 kg/m    Subjective:    Patient ID: Nicholas Palmer, male    DOB: [birth year], 62 y.o.   MRN: [id number]  HPI: Nicholas Palmer is a 63 y.o. male presenting on 12/23/2017 for Hyperlipidemia; Hypertension; and Cough, chest and sinus congestion (x 1 week, taking Coricidin)   HPI Hypertension Patient is currently on lisinopril 10 mg and metoprolol 125 mg twice daily, and their blood pressure today is 123/73. Patient denies any lightheadedness or dizziness. Patient denies headaches, blurred vision, chest pains, shortness of breath, or weakness. Denies any side effects from medication and is content with current medication.   Hyperlipidemia Patient is coming in for recheck of his hyperlipidemia. The patient is currently taking simvastatin. They deny any issues with myalgias or history of liver damage from it. They deny any focal numbness or weakness or chest pain.   Status post aortic dissection repair in 2011, patient has been monitored and stable  Cough and chest congestion and sinus congestion Patient comes in complaining of cough and chest congestion and sinus congestion that has been going on for the past 1 week.  He denies any fevers or chills or shortness of breath or wheezing.  He just feels like is getting down into his chest and is not improving.  He does admit that he is a former smoker and quit in 2004.  He has been taking Coricidin which has helped somewhat the cough but he still having a lot of issues with congestion.  He denies any sick contacts that he knows of and says it is not really improving or worsening but is just about staying the same.  The cough is mostly dry and nonproductive.  Relevant past medical, surgical, family and social history reviewed and updated as indicated. Interim medical history since our last visit reviewed. Allergies and medications reviewed  and updated.  Review of Systems  Constitutional: Negative for chills and fever.  HENT: Positive for congestion, postnasal drip, rhinorrhea, sinus pressure, sneezing and sore throat. Negative for ear discharge, ear pain and voice change.   Eyes: Negative for pain, discharge, redness and visual disturbance.  Respiratory: Positive for cough. Negative for shortness of breath and wheezing.   Cardiovascular: Negative for chest pain and leg swelling.  Musculoskeletal: Negative for gait problem.  Skin: Negative for rash.  Neurological: Negative for dizziness, weakness, light-headedness and numbness.  All other systems reviewed and are negative.   Per HPI unless specifically indicated above   Allergies as of 12/23/2017      Reactions   Codeine Nausea And Vomiting      Medication List        Accurate as of 12/23/17 10:03 AM. Always use your most recent med list.          albuterol 108 (90 Base) MCG/ACT inhaler Commonly known as:  PROVENTIL HFA;VENTOLIN HFA Inhale 2 puffs into the lungs every 6 (six) hours as needed for wheezing or shortness of breath.   aspirin 325 MG tablet Take 325 mg by mouth daily.   azithromycin 250 MG tablet Commonly known as:  ZITHROMAX Take 2 the first day and then one each day after.   lisinopril 10 MG tablet Commonly known as:  PRINIVIL,ZESTRIL Take 1 tablet (10 mg total) daily by mouth.   metoprolol tartrate 100  MG tablet Commonly known as:  LOPRESSOR Take 1 tablet (100 mg total) by mouth 2 (two) times daily. In addition to 61m twice daily   metoprolol tartrate 25 MG tablet Commonly known as:  LOPRESSOR Take 1 tablet (25 mg total) by mouth 2 (two) times daily. Takes in addition to the 1037mtwice a day.   simvastatin 20 MG tablet Commonly known as:  ZOCOR Take 1 tablet (20 mg total) at bedtime by mouth.          Objective:    BP 123/73   Pulse 72   Temp (!) 97.4 F (36.3 C) (Oral)   Ht 5' 9"  (1.753 m)   Wt 197 lb (89.4 kg)   BMI  29.09 kg/m   Wt Readings from Last 3 Encounters:  12/23/17 197 lb (89.4 kg)  11/24/17 200 lb (90.7 kg)  06/17/17 198 lb (89.8 kg)    Physical Exam  Constitutional: He is oriented to person, place, and time. He appears well-developed and well-nourished. No distress.  HENT:  Right Ear: Tympanic membrane, external ear and ear canal normal.  Left Ear: Tympanic membrane, external ear and ear canal normal.  Nose: Mucosal edema and rhinorrhea present. No sinus tenderness. No epistaxis. Right sinus exhibits no maxillary sinus tenderness and no frontal sinus tenderness. Left sinus exhibits no maxillary sinus tenderness and no frontal sinus tenderness.  Mouth/Throat: Uvula is midline and mucous membranes are normal. Posterior oropharyngeal edema present. No oropharyngeal exudate, posterior oropharyngeal erythema or tonsillar abscesses.  Eyes: Conjunctivae are normal. No scleral icterus.  Neck: Neck supple. No thyromegaly present.  Cardiovascular: Normal rate, regular rhythm, normal heart sounds and intact distal pulses.  No murmur heard. Pulmonary/Chest: Effort normal and breath sounds normal. No respiratory distress. He has no wheezes. He has no rales.  Musculoskeletal: Normal range of motion. He exhibits no edema.  Lymphadenopathy:    He has no cervical adenopathy.  Neurological: He is alert and oriented to person, place, and time. Coordination normal.  Skin: Skin is warm and dry. No rash noted. He is not diaphoretic.  Psychiatric: He has a normal mood and affect. His behavior is normal.  Nursing note and vitals reviewed.       Assessment & Plan:   Problem List Items Addressed This Visit      Cardiovascular and Mediastinum   Essential hypertension - Primary   Relevant Orders   CBC with Differential/Platelet (Completed)   CMP14+EGFR (Completed)     Other   Hypercholesterolemia   Relevant Orders   Lipid panel (Completed)   S/P aortic dissection repair   Relevant Orders   CBC with  Differential/Platelet (Completed)    Other Visit Diagnoses    Bronchitis       Relevant Medications   azithromycin (ZITHROMAX) 250 MG tablet   albuterol (PROVENTIL HFA;VENTOLIN HFA) 108 (90 Base) MCG/ACT inhaler     Will treat like bronchitis, possible related to his smoking history.  We will check blood work and continue his current blood pressure and cholesterol medications.  Follow up plan: Return in about 6 months (around 06/25/2018), or if symptoms worsen or fail to improve, for HTN and Cholesterol.  Counseling provided for all of the vaccine components Orders Placed This Encounter  Procedures  . CBC with Differential/Platelet  . CMP14+EGFR  . Lipid panel    JoCaryl PinaMD [facility] 12/23/2017, 10:03 AM

## 2018-01-20 ENCOUNTER — Other Ambulatory Visit: Payer: Self-pay | Admitting: Family Medicine

## 2018-01-20 DIAGNOSIS — J40 Bronchitis, not specified as acute or chronic: Secondary | ICD-10-CM

## 2018-05-10 ENCOUNTER — Other Ambulatory Visit: Payer: Self-pay | Admitting: *Deleted

## 2018-05-10 DIAGNOSIS — I7101 Dissection of thoracic aorta: Secondary | ICD-10-CM

## 2018-05-10 DIAGNOSIS — I71019 Dissection of thoracic aorta, unspecified: Secondary | ICD-10-CM

## 2018-06-17 ENCOUNTER — Encounter: Payer: Medicare Other | Admitting: Cardiothoracic Surgery

## 2018-06-18 NOTE — Congregational Nurse Program (Signed)
05-20-18 Check B/P  Blood pressure 130/76. No complaints of discomfort.Takes his meds as directed.  Cecilie Kicks, RN [phone].

## 2018-06-21 NOTE — Progress Notes (Signed)
HPI The patient presents for followup of hypertension and his aortic dissection. CT scan done 04/2017 demonstrates a stable thoracic repair.  He is followed routinely by Dr. Servando Snare. He is to have follow up CT later this week and then follow up with Dr. Servando Snare.  He is been doing well since I saw him last.  He walks for exercise a couple miles every day.  He sleeps from 3 to 11 PM.  He is awake the rest of the time and goes to check on his wife who lives in an apartment with him.  He says he has been doing this for a long time and this is his usual.  He denies any cardiovascular symptoms.   The patient denies any new symptoms such as chest discomfort, neck or arm discomfort. There has been no new shortness of breath, PND or orthopnea. There have been no reported palpitations, presyncope or syncope.     Allergies  Allergen Reactions  . Codeine Nausea And Vomiting    Current Outpatient Medications  Medication Sig Dispense Refill  . albuterol (PROVENTIL HFA;VENTOLIN HFA) 108 (90 Base) MCG/ACT inhaler TAKE 2 PUFFS BY MOUTH EVERY 6 HOURS AS NEEDED FOR WHEEZE OR SHORTNESS OF BREATH 18 Inhaler 1  . aspirin 81 MG tablet Take 1 tablet (81 mg total) by mouth daily.    . metoprolol tartrate (LOPRESSOR) 100 MG tablet Take 1 tablet (100 mg total) by mouth 2 (two) times daily. In addition to 40m twice daily 180 tablet 3  . metoprolol tartrate (LOPRESSOR) 25 MG tablet Take 1 tablet (25 mg total) by mouth 2 (two) times daily. Takes in addition to the 1023mtwice a day. 180 tablet 3  . lisinopril (PRINIVIL,ZESTRIL) 10 MG tablet Take 1 tablet (10 mg total) daily by mouth. (Patient not taking: Reported on 06/23/2018) 90 tablet 3  . simvastatin (ZOCOR) 20 MG tablet Take 1 tablet (20 mg total) at bedtime by mouth. (Patient not taking: Reported on 06/23/2018) 90 tablet 3   No current facility-administered medications for this visit.     Past Medical History:  Diagnosis Date  . [facility]   2011 status post repair of type  aortic dissection and aortic valve resuspension  . Decreased left ventricular function June 10, 2010   EF of 40-45% w/ out regional wall motion abnormalities 2011  EF 60%echo 2014  . Hypertension   . Nephrolithiasis    has history; status post stone removal  . Tobacco abuse     ROS:  As stated in the HPI and negative for all other systems.  PHYSICAL EXAM BP 118/70   Pulse 67   Ht _0  (1.753 m)   Wt 196 lb (88.9 kg)   BMI 28.94 kg/m   GENERAL:  Well appearing NECK:  No jugular venous distention, waveform within normal limits, carotid upstroke brisk and symmetric, no bruits, no thyromegaly LUNGS:  Clear to auscultation bilaterally CHEST:  Well healed sternotomy scar. HEART:  PMI not displaced or sustained,S1 and S2 within normal limits, no S3, no S4, no clicks, no rubs, no murmurs ABD:  Flat, positive bowel sounds normal in frequency in pitch, no bruits, no rebound, no guarding, no midline pulsatile mass, no hepatomegaly, no splenomegaly EXT:  2 plus pulses throughout, no edema, no cyanosis no clubbing    Lab Results  Component Value Date   CHOL 125 12/23/2017   TRIG 100 12/23/2017   HDL 33 (L) 12/23/2017   LDPastura  72 12/23/2017    EKG:  Sinus rhythm, rate 67 axis within normal limits, intervals within normal limits, no acute ST-T wave changes.  06/23/2018  ASSESSMENT AND PLAN  AORTIC DISSECTION REPAIRED:   He has follow up CT this week.  I told him we could get his creatinine today but he like to hold off.  He says in the past they have done it and left an IV and because he has a hard venous stick.  We will get his be met tomorrow then.  He will have follow-up with Dr. Servando Snare.   HTN:  His BP is well controlled .  No change in therapy.   He has been out of the lisinopril for a couple of days.   CARDIOMYOPATHY:  His last ejection fraction was 40%. This was in 2011.  It improved to 60% on follow up echo in 2014.  I  don't suspect that this is any lower.  No further imaging at this time.   DYSLIPIDEMIA:  LDL was as above.  No change in therapy.   CKD II:  He will have this followed as above.

## 2018-06-23 ENCOUNTER — Ambulatory Visit (INDEPENDENT_AMBULATORY_CARE_PROVIDER_SITE_OTHER): Payer: Medicare Other | Admitting: Cardiology

## 2018-06-23 ENCOUNTER — Encounter: Payer: Self-pay | Admitting: Cardiology

## 2018-06-23 VITALS — BP 118/70 | HR 67 | Ht 69.0 in | Wt 196.0 lb

## 2018-06-23 DIAGNOSIS — I71019 Dissection of thoracic aorta, unspecified: Secondary | ICD-10-CM

## 2018-06-23 DIAGNOSIS — I10 Essential (primary) hypertension: Secondary | ICD-10-CM

## 2018-06-23 DIAGNOSIS — I7101 Dissection of thoracic aorta: Secondary | ICD-10-CM | POA: Diagnosis not present

## 2018-06-23 MED ORDER — SIMVASTATIN 20 MG PO TABS: 20.0000 mg | ORAL_TABLET | Freq: Every day | ORAL | 3 refills | Status: DC

## 2018-06-23 MED ORDER — LISINOPRIL 10 MG PO TABS: 10.0000 mg | ORAL_TABLET | Freq: Every day | ORAL | 3 refills | Status: DC

## 2018-06-23 MED ORDER — ASPIRIN 81 MG PO TABS: 81.0000 mg | ORAL_TABLET | Freq: Every day | ORAL | Status: AC

## 2018-06-23 NOTE — Patient Instructions (Signed)
Medication Instructions:  Please decrease your Aspirin to 81 mg daily. Continue all other medications as listed.  If you need a refill on your cardiac medications before your next appointment, please call your pharmacy.   Follow-Up: Follow up in 1 year with Dr. Antoine PocheHochrein.  You will receive a letter in the mail 2 months before you are due.  Please call us when you receive this letter to schedule your follow up appointment.  Thank you for choosing [facility]!!

## 2018-06-24 ENCOUNTER — Ambulatory Visit
Admission: RE | Admit: 2018-06-24 | Discharge: 2018-06-24 | Disposition: A | Discharge status: Home / Self Care | Payer: Medicare Other | Source: Ambulatory Visit | Attending: Cardiothoracic Surgery | Admitting: Cardiothoracic Surgery

## 2018-06-24 ENCOUNTER — Ambulatory Visit (INDEPENDENT_AMBULATORY_CARE_PROVIDER_SITE_OTHER): Payer: Medicare Other | Admitting: Cardiothoracic Surgery

## 2018-06-24 VITALS — BP 124/80 | HR 63 | Resp 20 | Ht 69.0 in | Wt 198.0 lb

## 2018-06-24 DIAGNOSIS — Z8679 Personal history of other diseases of the circulatory system: Secondary | ICD-10-CM | POA: Diagnosis not present

## 2018-06-24 DIAGNOSIS — I71019 Dissection of thoracic aorta, unspecified: Secondary | ICD-10-CM

## 2018-06-24 DIAGNOSIS — I7101 Dissection of thoracic aorta: Secondary | ICD-10-CM

## 2018-06-24 MED ORDER — IOPAMIDOL (ISOVUE-370) INJECTION 76%
75.0000 mL | Freq: Once | INTRAVENOUS | Status: AC | PRN
  Administered 2018-06-24: 75 mL via INTRAVENOUS

## 2018-06-24 NOTE — Patient Instructions (Signed)

## 2018-06-24 NOTE — Progress Notes (Signed)
[address]       [place] [address]             [phone]                Nicholas Palmer [facility] Medical Record #[id number] Date of Birth: September 15, 1955  Referring: Dr. Antoine Poche Primary Care: Dettinger, Elige Radon, MD  Chief Complaint:    Chief Complaint  Patient presents with  . 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  Patient denies chest pain or anginal symptoms, he denies symptoms of congestive heart failure.  Since his surgery for repair of his dissection he is refrain from any cigarette use.     Current Activity/ Functional Status: Patient is independent with mobility/ambulation, transfers, ADL's, IADL's.   Past Medical History:  Diagnosis Date  . Aortic dissection [facility]) June 10, 2010   2011 status post repair of type  aortic dissection and aortic valve resuspension  . Decreased left ventricular function June 10, 2010   EF of 40-45% w/ out regional wall motion abnormalities 2011  EF 60%echo 2014  . Hypertension   . Nephrolithiasis    has history; status post stone removal  . Tobacco abuse     Past Surgical History:  Procedure Laterality Date  . 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  06/10/10 Dr. Tyrone Sage    Family History  Problem Relation Age of Onset  . Lung disease Mother        copd  . Heart disease Father   . Heart disease Brother   . Kidney disease Sister   . Stroke Sister   two sisters, one had brain aneurysm and was on dialysis died in her 63s His twin brother died of a myocardial infarction at age 63 Father died of myocardial infarction at age 57 Sister died at age 67 of an  abdominal aortic aneurysm  Social History   Socioeconomic History  . Marital status: Legally Separated    Spouse name: Not on file  . Number of children: Not on file  . Years of education: Not on file  . Highest education level: Not on file  Occupational History  . Not on file  Social Needs  . Financial resource strain: Not hard at all  . Food insecurity:    Worry: Never true    Inability: Never true  . Transportation needs:    Medical: No    Non-medical: No  Tobacco Use  . Smoking status: Former Smoker    Last attempt to quit: 08/11/2002    Years since quitting: 15.8  . Smokeless tobacco: Never Used  Substance and Sexual Activity  . Alcohol use: No    Alcohol/week: 0.0 standard drinks  . Drug use: No  . Sexual activity: Not Currently    Comment: separated, no kids  Lifestyle  . Physical activity:    Days per week: 7 days    Minutes per session: 30 min  . Stress: Not at all  Relationships  . Social connections:    Talks on phone: Three times a week    Gets  together: Three times a week    Attends religious service: Never    Active member of club or organization: No    Attends meetings of clubs or organizations: Never    Relationship status: Separated  . Intimate partner violence:    Fear of current or ex partner: No    Emotionally abused: No    Physically abused: No    Forced sexual activity: No  Other Topics Concern  . Not on file  Social History Narrative   Has 20-pack-year history of tobacco abuse, quitting many years ago. Denies alcohol or drugs. Does not routinely exercise     Social History   Tobacco Use  Smoking Status Former Smoker  . Last attempt to quit: 08/11/2002  . Years since quitting: 15.8  Smokeless Tobacco Never Used    Social History   Substance and Sexual Activity  Alcohol Use No  . Alcohol/week: 0.0 standard drinks     Allergies  Allergen Reactions  . Codeine Nausea And Vomiting    Current Outpatient Medications  Medication  Sig Dispense Refill  . aspirin 81 MG tablet Take 1 tablet (81 mg total) by mouth daily.    Marland Kitchen lisinopril (PRINIVIL,ZESTRIL) 10 MG tablet Take 1 tablet (10 mg total) by mouth daily. 90 tablet 3  . metoprolol tartrate (LOPRESSOR) 100 MG tablet Take 1 tablet (100 mg total) by mouth 2 (two) times daily. In addition to 25mg  twice daily 180 tablet 3  . metoprolol tartrate (LOPRESSOR) 25 MG tablet Take 1 tablet (25 mg total) by mouth 2 (two) times daily. Takes in addition to the 100mg  twice a day. 180 tablet 3  . simvastatin (ZOCOR) 20 MG tablet Take 1 tablet (20 mg total) by mouth at bedtime. 90 tablet 3   No current facility-administered medications for this visit.        Review of Systems:    Review of Systems  Constitutional: Negative.   HENT: Negative.   Eyes: Negative.   Respiratory: Negative.   Cardiovascular: Negative.   Gastrointestinal: Negative.   Genitourinary: Negative.   Musculoskeletal: Negative.   Skin: Negative.   Neurological: Negative.   Endo/Heme/Allergies: Negative.   Psychiatric/Behavioral: Negative.        Physical Exam: BP 124/80   Pulse 63   Resp 20   Ht 5\' 9"  (1.753 m)   Wt 198 lb (89.8 kg)   SpO2 98% Comment: RA  BMI 29.24 kg/m   General appearance: alert, cooperative, appears stated age and no distress Head: Normocephalic, without obvious abnormality, atraumatic Neck: no adenopathy, no carotid bruit, no JVD, supple, symmetrical, trachea midline and thyroid not enlarged, symmetric, no tenderness/mass/nodules Lymph nodes: Cervical, supraclavicular, and axillary nodes normal. Resp: clear to auscultation bilaterally Back: symmetric, no curvature. ROM normal. No CVA tenderness. Cardio: regular rate and rhythm, S1, S2 normal, no murmur, click, rub or gallop GI: soft, non-tender; bowel sounds normal; no masses,  no organomegaly Extremities: extremities normal, atraumatic, no cyanosis or edema and Homans sign is negative, no sign of DVT Neurologic:  Grossly normal   Diagnostic Studies & Laboratory data:     Recent Radiology Findings:  Ct Angio Chest Aorta W/cm &/or Wo/cm  Result Date: 06/24/2018 CLINICAL DATA:  Follow-up thoracic aortic dissection. Aortic root replacement with resuspension of the aortic valve in 2011. EXAM: CT ANGIOGRAPHY CHEST WITH CONTRAST TECHNIQUE: Multidetector CT imaging of the chest was performed using the standard protocol during bolus administration of intravenous contrast. Multiplanar CT image reconstructions and MIPs were obtained to  evaluate the vascular anatomy. CONTRAST:  75mL ISOVUE-370 IOPAMIDOL (ISOVUE-370) INJECTION 76% COMPARISON:  12/11/2016 and 05/10/2015 FINDINGS: Cardiovascular: Stable appearance the aortic root and ascending thoracic aortic replacement. There is a stable fold or flap near the aortic root which is unchanged. Again noted is a dissection involving the aortic arch with a flap extending into the right innominate artery. The dissection does not extend into the right subclavian or right common carotid artery. Left common carotid artery and left subclavian artery are widely patent without dissection. Proximal aortic arch measures roughly 4.1 cm and stable. The aortic dissection extends into the descending thoracic aorta. Proximal descending thoracic aorta measures 4.3 cm and stable. Again noted is mural thrombus in the false lumen along the posterior aspect of the descending thoracic aorta. Mid descending thoracic aorta measures roughly 3.8 cm and stable. The distal descending thoracic aorta near the hiatus measures 3.3 cm and stable. Dissection extends into the proximal abdominal aorta. Celiac trunk originates from the true lumen. SMA originates from the true lumen. Left renal artery originates from the true lumen. Right renal artery appears to have contribution from both the true and false lumen. Pulmonary arteries are not well opacified on this examination. Mediastinum/Nodes: Thyroid tissue is  unremarkable. No mediastinal or hilar lymphadenopathy. Esophagus is unremarkable. Mildly prominent left axillary lymph nodes appear stable. Lungs/Pleura: Trachea and mainstem bronchi are patent. Chronic pleural-based nodularity at the posterior right lung base appears to represent scarring. Focal volume loss at the medial right middle lobe. Stable nodularity along the posterior right upper lobe measuring roughly 7 mm on sequence 11, image 34. This pleural nodularity has not significantly changed since 05/10/2015 and most compatible with scarring. There is no significant airspace disease or lung consolidation. Upper Abdomen: Low-density left renal cysts. Stable low-density structures near the hepatic dome are most compatible with hepatic cysts. Adrenal glands are unremarkable. Musculoskeletal: Prior median sternotomy. No acute bone abnormality. Review of the MIP images confirms the above findings. IMPRESSION: 1. Stable postoperative changes related to replacement of the aortic root and ascending thoracic aorta. 2. Stable configuration and size of the aortic dissection involving the aortic arch, descending thoracic aorta and the visualized abdominal aorta. Distal aortic arch/proximal descending thoracic aorta measures up to 4.3 cm and stable. 3. Dissection flap involves the right innominate artery and right renal artery and this is stable. 4. No acute chest abnormality. Electronically Signed   By: Richarda Overlie M.D.   On: 06/24/2018 12:14   I have independently reviewed the above radiology studies  and reviewed the findings with the patient.    Ct Angio Chest Aorta W/cm &/or Wo/cm  Result Date: 12/11/2016 CLINICAL DATA:  Aortic dissection. EXAM: CT ANGIOGRAPHY CHEST WITH CONTRAST TECHNIQUE: Multidetector CT imaging of the chest was performed using the standard protocol during bolus administration of intravenous contrast. Multiplanar CT image reconstructions and MIPs were obtained to evaluate the vascular anatomy.  CONTRAST:  75 mL of Isovue 370 intravenously. COMPARISON:  CT scan of May 10, 2015. FINDINGS: Cardiovascular: Status post surgical repair of ascending thoracic aortic dissection. Stable small dissection flap is seen just above aortic root and proximal ascending thoracic aorta. Stable dissection is seen beginning at the distal anastomosis of the surgical graft. The dissection flap is seen to extend into the right innominate artery again, which is perfused by both the true and false lumen. The dissection is seen to extend through the aortic arch and descending thoracic aorta into the abdominal aorta. The left common carotid and  subclavian arteries are seen to arise from the true lumen and are free of significant stenosis. Mediastinum/Nodes: No enlarged mediastinal, hilar, or axillary lymph nodes. Thyroid gland, trachea, and esophagus demonstrate no significant findings. Lungs/Pleura: Lungs are clear. No pleural effusion or pneumothorax. Upper Abdomen: The thoracic aortic dissection extends into the visualized portion of proximal abdominal aorta. The celiac, superior mesenteric and left renal arteries appear to arise from the true lumen. Dissection flap extends into the proximal right renal artery, which appears to be perfused by both the true and false lumens. Symmetric enhancement of both kidneys is noted. Musculoskeletal: No chest wall abnormality. No acute or significant osseous findings. Review of the MIP images confirms the above findings. IMPRESSION: Status post surgical repair of ascending thoracic aortic dissection. Small residual dissection is noted in proximal ascending thoracic aorta just above aortic root. Stable dissection is seen beginning at the distal anastomosis of the surgical graft which extends through aortic arch and descending thoracic aorta and into visualized portion of proximal abdominal aorta. Dissection flap is again noted to extend into the origin of the right innominate artery, as  well as into the proximal portion of the right renal artery. Electronically Signed   By: Lupita Raider, M.D.   On: 12/11/2016 14:23   Ct Angio Chest W/cm &/or Wo Cm  05/10/2015   CLINICAL DATA:  Followup aortic dissection.  EXAM: CT ANGIOGRAPHY CHEST WITH CONTRAST  TECHNIQUE: Multidetector CT imaging of the chest was performed using the standard protocol during bolus administration of intravenous contrast. Multiplanar CT image reconstructions and MIPs were obtained to evaluate the vascular anatomy.  CONTRAST:  75 cc Isovue 370  COMPARISON:  CT scan 04/20/2014  FINDINGS: Mediastinum/Nodes: The chest wall is unremarkable. No chest wall masses, supraclavicular or axillary adenopathy. Small scattered lymph nodes are noted and are stable. The thyroid gland is normal.  The heart is normal in size. No pericardial effusion. Stable coronary artery calcifications. There are small scattered mediastinal and hilar lymph nodes but no mass or overt adenopathy. The esophagus is grossly normal.  Aorta:  Surgical changes of prior type a aortic dissection repair. As was demonstrated on prior there is persisting dissection flap extending from the aortic root repair. The flap again is seen to extend through the aortic arch and the length of descending aorta and visualized abdominal aorta Greatest diameter of the transverse arch measures 4.2 cm, unchanged from prior.  Again the dissection flap extends into the innominate artery. The innominate artery is perfused both by the true lumen and false lumen. The left common carotid artery and left subclavian artery are perfused from the true lumen.  Lungs/Pleura: No acute pulmonary findings. No pulmonary lesions or pleural effusion. The tracheobronchial tree is unremarkable.  Upper abdomen: The dissection courses down below the renal arteries. The aortic branch vessels are patent.  Stable left hepatic lobe cyst. The pancreas and spleen are normal and stable. The adrenal glands are normal.  Stable left renal cysts. A retroaortic left renal vein is noted.  Musculoskeletal: No significant osseous findings.  Review of the MIP images confirms the above findings.  IMPRESSION: Stable CT appearance of the thoracic and aortic dissection. No complicating features are demonstrated.  No acute pulmonary findings.  Stable upper abdomen.   Electronically Signed   By: Rudie Meyer M.D.   On: 05/10/2015 13:27   Ct Angio Chest Aorta W/cm &/or Wo/cm  04/20/2014   CLINICAL DATA:  62 year old male with a history of previous type A  aortic dissection. Surgical repair was October 2011.  EXAM: CTA ABDOMEN AND PELVIS wITHOUT AND WITH CONTRAST  TECHNIQUE: Multidetector CT imaging of the abdomen and pelvis was performed using the standard protocol during bolus administration of intravenous contrast. Multiplanar reconstructed images and MIPs were obtained and reviewed to evaluate the vascular anatomy.  CONTRAST:  80mL OMNIPAQUE IOHEXOL 350 MG/ML SOLN  COMPARISON:  Multiple prior CT for comparison. Most recent CT 02/10/2013  FINDINGS: Nonvascular:  Chest:  Unremarkable appearance of the soft tissues of the chest. No supraclavicular or axillary adenopathy. Surgical changes of prior median sternotomy. Surgical changes also present within the right supraclavicular region.  Unremarkable appearance of the visualized thyroid.  Central airways patent.  No mediastinal adenopathy.  Heart size within normal limits.  Surgical changes of previous type a aortic dissection.  No confluent airspace disease or pneumothorax. No pleural effusion. Minimal atelectasis at the bilateral bases. No pulmonary nodules identified.  Abdomen/pelvis:  Low-density lesion within segment 4 a (image 92 series 4), which is larger than on the comparison CT, and in fact has enlarged to double the size of the lesion on the first available comparison 12/14/2006. In 2008 lesion measured approximately 8 mm and is now 16 mm.  There are additional small hypodensities  measuring a few mm in segment 4B (image 119) as well as in segment 3 (image 108).  No intrahepatic or extrahepatic biliary ductal dilatation.  No evidence of radiopaque gallstones with no pericholecystic fluid or inflammatory changes. No peripancreatic fluid or inflammatory changes.  Unremarkable appearance of the bilateral adrenal glands. Unremarkable appearance of the spleen.  No abnormally distended small bowel or colon. Normal appendix. There are a few colonic diverticula with no evidence of associated inflammatory changes.  Unremarkable appearance of the right kidney with no hydronephrosis or nephrolithiasis. Unremarkable right ureter. No left-sided hydronephrosis or nephrolithiasis. Exophytic cyst of the left kidney measures 5.2 cm, unchanged from prior. Additional small cyst interpolar region measures 18 mm.  No peritoneal lymphadenopathy.  Small portacaval lymph node.  Unremarkable appearance of the urinary bladder. Transverse diameter of prostate measures nearly 5 cm.  Vascular:  Aorta:  Surgical changes of prior type a aortic dissection repair. As was demonstrated on prior there is persisting dissection flap extending from the aortic root repair. The flap again is seen to extend through the aortic arch and the length of descending aorta through the bifurcation.  Greatest diameter of the transverse arch measures 4.2 cm, unchanged from prior.  Again the dissection flap extends into the innominate artery. The innominate artery is perfused both by the true lumen and false lumen. The left common carotid artery and left subclavian artery are perfused from the true lumen.  True lumen perfuses the celiac artery superior mesenteric artery, and inferior mesenteric artery.  Dissection flap extends into the origin of the right renal artery which is perfused by both lumen and remains patent.  Two separate left renal artery, each appear perfused via the true lumen.  The iliac arteries:  The dissection flap extends  through the aortic bifurcation into the bilateral common iliac arteries. On the right, the dissection flap terminates at the origin of the hypogastric artery. Hypogastric artery appears perfused via the true lumen, although the flap may extend into the epigastric artery.  On the left the dissection flap extends into the hypogastric artery which remains perfused. The left external iliac artery is of normal caliber and remains perfused.  Venous:  Unremarkable appearance of the portal venous system. Retro aortic  left renal vein.  Review of the MIP images confirms the above findings.  IMPRESSION: Re-demonstration of surgically repaired type A aortic dissection. The greatest diameter of the aorta and configuration of the dissection flap are unchanged, with flap extending from the aortic root repair into the bilateral iliac arteries, as above. Perfusion is maintained within the branch vessels of the aortic arch and of the mesenteric vessels.  Low-density lesion of the left hepatic lobe has slowly increased in size from first comparison CT in 2008. Further evaluation with a liver protocol MRI may be warranted, particularly if the patient has a history of a known malignancy.  These results will be called to the ordering clinician or representative by the Radiologist Assistant, and communication documented in the PACS or zVision Dashboard.  Signed,  Yvone Neu. Loreta Ave, DO  [facility]  [facility]   Electronically Signed   By: Gilmer Mor O.D.   On: 04/20/2014 15:37      Ct Angio Chest W/cm &/or Wo Cm  02/10/2013   *RADIOLOGY REPORT*  Clinical Data:  Follow up aortic dissection. History of type 1 aortic dissection and status post replacement of the ascending thoracic aorta and resuspension of the aortic valve.  CT ANGIOGRAPHY CHEST, ABDOMEN AND PELVIS  Technique:  Multidetector CT imaging through the chest, abdomen and pelvis was performed using the standard protocol during  bolus administration of intravenous contrast.  Multiplanar reconstructed images including MIPs were obtained and reviewed to evaluate the vascular anatomy.  Contrast: 80mL OMNIPAQUE IOHEXOL 350 MG/ML SOLN,  Comparison:  12/25/2011  CTA CHEST  Findings:  Again noted are postsurgical changes consistent with replacement of the ascending thoracic aorta.  The ascending thoracic aorta is patent.  There is a dissection involving the aortic arch that extends in the right innominate artery and terminates at the innominate artery bifurcation. The cephalad extent of the dissection is unchanged.  There is flow in the great vessels.  Posterior aortic arch roughly measures 4.3 cm in diameter and not significantly changed.  There is slightly increased thrombus within the posterior false lumen of the descending thoracic aorta.  The thoracic aorta at the hiatus measures 3.1 cm unchanged.  No significant pericardial or pleural fluid.  A precarinal lymph node roughly measures 0.9 cm in short axis and minimally changed. Overall, there is no significant chest lymphadenopathy.  The trachea and mainstem bronchi are patent.  Stable pleural-based scarring at the posterior right lung base.  A small area of pleural thickening along the right major fissure on image 33, sequence #5. There is a stable 5 mm pleural based nodule in the posterior right upper lobe on image 15, sequence five.  This is probably unchanged since 06/10/2010.   No focal consolidation or parenchymal disease in the lungs.  No acute bony abnormality.   Review of the MIP images confirms the above findings.  IMPRESSION: No significant change in the thoracic aortic dissection.  There is slightly increased mural thrombus within the false lumen.  No significant change in the appearance of the ascending aorta replacement.  Stable pleural based nodules in the lungs as described.  Recommend attention to these on followup imaging.  CTA ABDOMEN AND PELVIS  Findings:  The aortic  dissection extends into the abdominal aorta and terminates in the proximal right external iliac artery and proximal left internal iliac artery.  The configuration and extent of the dissection is unchanged.  There is no significant aneurysmal dilatation of the aorta.  The  abdominal aorta roughly measures up to 2.8 cm at the level of the celiac trunk which is unchanged.  The celiac trunk, superior mesenteric artery and both left renal arteries originate from the anterior lumen, which is probably the true lumen.  Dissection extends into the right renal artery. Question a small fenestration of the dissection on image 138.  The IMA originates from the anterior lumen.  Iliac arteries and proximal femoral arteries are patent bilaterally.  Stable low density structure in the left hepatic lobe roughly measures 1.4 cm.  No gross abnormality to the liver, gallbladder, spleen, pancreas or adrenal glands.  4.6 cm cyst involving left kidney upper pole.  There is a smaller cyst in the left kidney mid pole.  Incidentally, there is a retroaortic left renal vein. Normal appearance of the appendix.  No gross abnormality prostate, seminal vesicles and urinary bladder.  No gross abnormality of the small or large bowel.  No acute bony abnormality.   Review of the MIP images confirms the above findings.  IMPRESSION: Stable appearance of the aortic dissection.  No acute abnormalities within the abdomen or pelvis.  Left renal cysts.   Original Report Authenticated By: Richarda Overlie, M.D.    Recent Lab Findings: Lab Results  Component Value Date   WBC 7.2 12/23/2017   HGB 13.9 12/23/2017   HCT 41.8 12/23/2017   PLT 174 12/23/2017   GLUCOSE 79 12/23/2017   CHOL 125 12/23/2017   TRIG 100 12/23/2017   HDL 33 (L) 12/23/2017   LDLCALC 72 12/23/2017   ALT 18 12/23/2017   AST 20 12/23/2017   NA 137 12/23/2017   K 4.3 12/23/2017   CL 100 12/23/2017   CREATININE 1.54 (H) 12/23/2017   BUN 17 12/23/2017   CO2 23 12/23/2017   TSH  2.370 02/19/2016   INR 0.82 06/10/2010    Assessment / Plan:   Patient returns today in follow-up after repair of his a sending aortic dissection with resuspension of the aortic valve replacement of the ascending aorta in October 2011.  Follow-up scan shows  stable postoperative changes related to replacement of the aortic root and ascending thoracic aorta.  And stable configuration and size of the aortic dissection involving the aortic arch, descending thoracic aorta and the visualized abdominal aorta. Distal aortic arch/proximal descending thoracic aorta measures up to 4.3 cm and stable. Scans were reviewed with the patient recommend that we follow-up in 18 months with repeat scan.   Patient was warned about not using Cipro and similar antibiotics. Recent studies have raised concern that fluoroquinolone antibiotics could be associated with an increased risk of aortic aneurysm Fluoroquinolones have non-antimicrobial properties that might jeopardise the integrity of the extracellular matrix of the vascular wall In a  propensity score matched cohort study in [place], there was a 66% increased rate of aortic aneurysm or dissection associated with oral fluoroquinolone use, compared with amoxicillin use, within a 60 day risk period from start of treatment    Delight Ovens MD  Beeper [phone] Office [phone] 06/24/2018 12:40 PM

## 2018-06-28 ENCOUNTER — Ambulatory Visit: Payer: Medicare Other | Admitting: Family Medicine

## 2018-06-30 ENCOUNTER — Encounter: Payer: Self-pay | Admitting: Family Medicine

## 2018-09-03 ENCOUNTER — Other Ambulatory Visit: Payer: Self-pay | Admitting: Cardiology

## 2018-12-02 ENCOUNTER — Other Ambulatory Visit: Payer: Self-pay | Admitting: *Deleted

## 2018-12-02 MED ORDER — METOPROLOL TARTRATE 25 MG PO TABS: 25.0000 mg | ORAL_TABLET | Freq: Two times a day (BID) | ORAL | 3 refills | Status: DC

## 2019-05-31 ENCOUNTER — Telehealth: Payer: Self-pay | Admitting: Cardiology

## 2019-05-31 MED ORDER — LISINOPRIL 10 MG PO TABS: 10.0000 mg | ORAL_TABLET | Freq: Every day | ORAL | 0 refills | Status: DC

## 2019-05-31 MED ORDER — SIMVASTATIN 20 MG PO TABS: 20.0000 mg | ORAL_TABLET | Freq: Every day | ORAL | 0 refills | Status: DC

## 2019-05-31 NOTE — Telephone Encounter (Signed)
New message   Pt c/o medication issue:  1. Name of Medication: lisinopril (PRINIVIL,ZESTRIL) 10 MG tablet   simvastatin (ZOCOR) 20 MG tablet  2. How are you currently taking this medication (dosage and times per day)? As written  3. Are you having a reaction (difficulty breathing--STAT)? n/a  4. What is your medication issue? Patient needs a new prescription for these medications sent to [id number] - [place], [place] needs a 90 day supply.

## 2019-05-31 NOTE — Telephone Encounter (Signed)
Medication sent to pharmacy  

## 2019-08-12 DIAGNOSIS — I129 Hypertensive chronic kidney disease with stage 1 through stage 4 chronic kidney disease, or unspecified chronic kidney disease: Secondary | ICD-10-CM | POA: Insufficient documentation

## 2019-08-12 DIAGNOSIS — N1831 Chronic kidney disease, stage 3a: Secondary | ICD-10-CM | POA: Insufficient documentation

## 2019-08-14 DIAGNOSIS — Z7189 Other specified counseling: Secondary | ICD-10-CM | POA: Insufficient documentation

## 2019-08-14 NOTE — Progress Notes (Signed)
Cardiology Office Note   Date:  08/17/2019   ID:  Nicholas Palmer, DOB 15-Dec-1955, MRN [id number]  PCP:  Dettinger, Fransisca Kaufmann, MD  Cardiologist:   No primary care provider on file.    Minute thank you see you take this in the Chief Complaint  Patient presents with  . Thoracic Aortic Dissection      History of Present Illness: Nicholas Palmer is a 64 y.o. male who presents for follow up of hypertension and his aortic dissection. 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  The patient denies any new symptoms such as chest discomfort, neck or arm discomfort. There has been no new shortness of breath, PND or orthopnea. There have been no reported palpitations, presyncope or syncope.   He is still walking.     Past Medical History:  Diagnosis Date  . Aortic dissection [facility]) June 10, 2010   2011 status post repair of type  aortic dissection and aortic valve resuspension  . Decreased left ventricular function June 10, 2010   EF of 40-45% w/ out regional wall motion abnormalities 2011  EF 60%echo 2014  . Hypertension   . Nephrolithiasis    has history; status post stone removal  . Tobacco abuse     Past Surgical History:  Procedure Laterality Date  . 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  06/10/10 Dr. Servando Snare     Current Outpatient Medications  Medication Sig Dispense Refill  . aspirin 81 MG tablet Take 1 tablet (81 mg total) by mouth daily.    Marland Kitchen lisinopril (ZESTRIL) 10 MG tablet Take 1 tablet (10 mg total) by mouth daily. 90 tablet 3  . metoprolol tartrate (LOPRESSOR) 100 MG tablet Take 1 tablet (100 mg total) by mouth 2 (two) times daily. In addition to 58m twice daily 180 tablet 3  . metoprolol tartrate (LOPRESSOR) 25 MG tablet Take 1 tablet (25 mg total) by mouth 2 (two) times daily. Takes in addition to the 102mtwice a day. 180 tablet 3  . simvastatin (ZOCOR) 20  MG tablet Take 1 tablet (20 mg total) by mouth at bedtime. 90 tablet 3   No current facility-administered medications for this visit.    Allergies:   Codeine and Quinolones    ROS:  Please see the history of present illness.   Otherwise, review of systems are positive for none.   All other systems are reviewed and negative.    PHYSICAL EXAM: VS:  BP 120/76   Pulse 74   Ht 5' 9"  (1.753 m)   Wt 197 lb (89.4 kg)   BMI 29.09 kg/m  , BMI Body mass index is 29.09 kg/m. GENERAL:  Well appearing NECK:  No jugular venous distention, waveform within normal limits, carotid upstroke brisk and symmetric, no bruits, no thyromegaly LUNGS:  Clear to auscultation bilaterally CHEST:  Well healed sternotomy scar. HEART:  PMI not displaced or sustained,S1 and S2 within normal limits, no S3, no S4, no clicks, no rubs, no murmurs ABD:  Flat, positive bowel sounds normal in frequency in pitch, no bruits, no rebound, no guarding, no midline pulsatile mass, no hepatomegaly, no splenomegaly EXT:  2 plus pulses upper mildly diminished dorsalis pedis and posterior tibialis bilateral, no edema, no cyanosis no clubbing   EKG:  EKG is ordered today. The ekg ordered today demonstrates sinus rhythm, rate 74, axis within normal intervals within normal  limits, no acute ST-T wave changes.   Recent Labs: No results found for requested labs within last 8760 hours.    Lipid Panel    Component Value Date/Time   CHOL 125 12/23/2017 1004   TRIG 100 12/23/2017 1004   HDL 33 (L) 12/23/2017 1004   CHOLHDL 3.8 12/23/2017 1004   CHOLHDL 3.4 06/14/2010 0529   VLDL 18 06/14/2010 0529   LDLCALC 72 12/23/2017 1004      Wt Readings from Last 3 Encounters:  08/17/19 197 lb (89.4 kg)  06/24/18 198 lb (89.8 kg)  06/23/18 196 lb (88.9 kg)      Other studies Reviewed: Additional studies/ records that were reviewed today include: Labs, CT, surgical notes. Review of the above records demonstrates:  Please see  elsewhere in the note.     ASSESSMENT AND PLAN:  AORTIC DISSECTION REPAIRED:   He is scheduled for follow up CT in early spring/late summer.  His repair was stable in November.   I gave him instructions to contact Dr. [name] office in early April if he has not heard from them to schedule his follow-up CT and follow-up visit.  HTN:  His BP is controlled.  No change in therapy.   CARDIOMYOPATHY:  His last ejection fraction was 40%. This was in 2011.  It improved to 60% on follow up echo in 2014.  No further imaging.  DYSLIPIDEMIA:  LDL was 72 but this was in 2019.  I am sending him to get a lipid profile and liver enzymes.   CKD II:  Creat was 1.54 in May 2019.  He will get repeat labs.   COVID EDUCATION: We talked about the vaccine and he was educated.  Current medicines are reviewed at length with the patient today.  The patient does not have concerns regarding medicines.  The following changes have been made:  no change  Labs/ tests ordered today include:   Orders Placed This Encounter  Procedures  . CBC  . Comp Met (CMET)  . Lipid Profile  . EKG 12-Lead     Disposition:   FU with me in one year. Follow up with Dr. Servando Snare as above.     Signed, [name], MD  08/17/2019 12:12 PM    [place]  '

## 2019-08-16 ENCOUNTER — Telehealth: Payer: Self-pay | Admitting: Cardiology

## 2019-08-16 NOTE — Telephone Encounter (Signed)
    COVID-19 Pre-Screening Questions:  . In the past 7 to 10 days have you had a cough,  shortness of breath, headache, congestion, fever (100 or greater) body aches, chills, sore throat, or sudden loss of taste or sense of smell? No . Have you been around anyone with known Covid 19. no . Have you been around anyone who is awaiting Covid 19 test results in the past 7 to 10 days? no . Have you been around anyone who has been exposed to Covid 19, or has mentioned symptoms of Covid 19 within the past 7 to 10 days?no  If you have any concerns/questions about symptoms patients report during screening (either on the phone or at threshold). Contact the provider seeing the patient or DOD for further guidance.  If neither are available contact a member of the leadership team.            

## 2019-08-17 ENCOUNTER — Encounter: Payer: Self-pay | Admitting: Cardiology

## 2019-08-17 ENCOUNTER — Other Ambulatory Visit: Payer: Self-pay

## 2019-08-17 ENCOUNTER — Ambulatory Visit (INDEPENDENT_AMBULATORY_CARE_PROVIDER_SITE_OTHER): Payer: Medicare Other | Admitting: Cardiology

## 2019-08-17 VITALS — BP 120/76 | HR 74 | Ht 69.0 in | Wt 197.0 lb

## 2019-08-17 DIAGNOSIS — I10 Essential (primary) hypertension: Secondary | ICD-10-CM | POA: Diagnosis not present

## 2019-08-17 DIAGNOSIS — I7101 Dissection of thoracic aorta: Secondary | ICD-10-CM

## 2019-08-17 DIAGNOSIS — E7800 Pure hypercholesterolemia, unspecified: Secondary | ICD-10-CM

## 2019-08-17 DIAGNOSIS — Z7189 Other specified counseling: Secondary | ICD-10-CM | POA: Diagnosis not present

## 2019-08-17 DIAGNOSIS — Z79899 Other long term (current) drug therapy: Secondary | ICD-10-CM | POA: Diagnosis not present

## 2019-08-17 DIAGNOSIS — I71019 Dissection of thoracic aorta, unspecified: Secondary | ICD-10-CM

## 2019-08-17 MED ORDER — METOPROLOL TARTRATE 100 MG PO TABS: 100.0000 mg | ORAL_TABLET | Freq: Two times a day (BID) | ORAL | 3 refills | Status: DC

## 2019-08-17 MED ORDER — LISINOPRIL 10 MG PO TABS: 10.0000 mg | ORAL_TABLET | Freq: Every day | ORAL | 3 refills | Status: DC

## 2019-08-17 MED ORDER — METOPROLOL TARTRATE 25 MG PO TABS: 25.0000 mg | ORAL_TABLET | Freq: Two times a day (BID) | ORAL | 3 refills | Status: DC

## 2019-08-17 MED ORDER — SIMVASTATIN 20 MG PO TABS: 20.0000 mg | ORAL_TABLET | Freq: Every day | ORAL | 3 refills | Status: DC

## 2019-08-17 NOTE — Patient Instructions (Signed)
Medication Instructions:  The current medical regimen is effective;  continue present plan and medications.  *If you need a refill on your cardiac medications before your next appointment, please call your pharmacy*  Lab Work: Please have blood work (Lipid, CBC, CMET)  If you have labs (blood work) drawn today and your tests are completely normal, you will receive your results only by: Marland Kitchen MyChart Message (if you have MyChart) OR . A paper copy in the mail If you have any lab test that is abnormal or we need to change your treatment, we will call you to review the results.  Follow-Up: At [facility], you and your health needs are our priority.  As part of our continuing mission to provide you with exceptional heart care, we have created designated Provider Care Teams.  These Care Teams include your primary Cardiologist (physician) and Advanced Practice Providers (APPs -  Physician Assistants and Nurse Practitioners) who all work together to provide you with the care you need, when you need it.  Your next appointment:   12 month(s)  The format for your next appointment:   In Person  Provider:   Rollene Rotunda, MD  Thank you for choosing [facility]!!

## 2020-07-30 ENCOUNTER — Other Ambulatory Visit: Payer: Self-pay

## 2020-07-30 ENCOUNTER — Encounter: Payer: Self-pay | Admitting: Family Medicine

## 2020-07-30 ENCOUNTER — Ambulatory Visit (INDEPENDENT_AMBULATORY_CARE_PROVIDER_SITE_OTHER): Payer: Medicare Other | Admitting: Family Medicine

## 2020-07-30 DIAGNOSIS — J0110 Acute frontal sinusitis, unspecified: Secondary | ICD-10-CM

## 2020-07-30 MED ORDER — PREDNISONE 20 MG PO TABS: ORAL_TABLET | ORAL | 0 refills | Status: DC

## 2020-07-30 MED ORDER — FLUTICASONE PROPIONATE 50 MCG/ACT NA SUSP: 1.0000 | Freq: Two times a day (BID) | NASAL | 6 refills | Status: DC | PRN

## 2020-07-30 MED ORDER — AMOXICILLIN-POT CLAVULANATE 875-125 MG PO TABS: 1.0000 | ORAL_TABLET | Freq: Two times a day (BID) | ORAL | 0 refills | Status: DC

## 2020-07-30 NOTE — Progress Notes (Signed)
Virtual Visit via telephone Note  I connected with Nicholas Palmer on 07/30/20 at 1521 by telephone and verified that I am speaking with the correct person using two identifiers. Nicholas Palmer is currently located at home and patient are currently with her during visit. The provider, Elige Radon Lexee Brashears, MD is located in their office at time of visit.  Call ended at 1532  I discussed the limitations, risks, security and privacy concerns of performing an evaluation and management service by telephone and the availability of in person appointments. I also discussed with the patient that there may be a patient responsible charge related to this service. The patient expressed understanding and agreed to proceed.   History and Present Illness: Patient is calling in for cough and sinus congestion and pressure in his head and fatigue.  He denies fever or chills or body aches.  He has lost some taste and smell.  He has this for 6 days. He denies sick contacts.  He has been using alka-seltzer and coricidin and tussin.  He feels weak and not improving.  He denies SOB or wheezing.  He is drinking fluids and making urine.    No diagnosis found.  Outpatient Encounter Medications as of 07/30/2020  Medication Sig  . aspirin 81 MG tablet Take 1 tablet (81 mg total) by mouth daily.  Marland Kitchen lisinopril (ZESTRIL) 10 MG tablet Take 1 tablet (10 mg total) by mouth daily.  . metoprolol tartrate (LOPRESSOR) 100 MG tablet Take 1 tablet (100 mg total) by mouth 2 (two) times daily. In addition to 25mg  twice daily  . metoprolol tartrate (LOPRESSOR) 25 MG tablet Take 1 tablet (25 mg total) by mouth 2 (two) times daily. Takes in addition to the 100mg  twice a day.  . simvastatin (ZOCOR) 20 MG tablet Take 1 tablet (20 mg total) by mouth at bedtime.   No facility-administered encounter medications on file as of 07/30/2020.    Review of Systems  Constitutional: Negative for chills and fever.  HENT: Positive for congestion,  postnasal drip, rhinorrhea, sinus pressure, sneezing and sore throat. Negative for ear discharge, ear pain and voice change.   Eyes: Negative for pain, discharge, redness and visual disturbance.  Respiratory: Positive for cough. Negative for shortness of breath and wheezing.   Cardiovascular: Negative for chest pain and leg swelling.  Musculoskeletal: Negative for gait problem.  Skin: Negative for rash.  All other systems reviewed and are negative.   Observations/Objective: Patient sounds comfortable and in no acute   Assessment and Plan: Problem List Items Addressed This Visit   None   Visit Diagnoses    Acute non-recurrent frontal sinusitis    -  Primary   Relevant Medications   fluticasone (FLONASE) 50 MCG/ACT nasal spray   amoxicillin-clavulanate (AUGMENTIN) 875-125 MG tablet   predniSONE (DELTASONE) 20 MG tablet   Other Relevant Orders   Novel Coronavirus, NAA ([facility])      Will treat like sinuses but come in for Covid testing, sounds like he may have Covid, recommended hydration and it sounds like he thinks he is keeping up on it. Follow up plan: Return if symptoms worsen or fail to improve.     I discussed the assessment and treatment plan with the patient. The patient was provided an opportunity to ask questions and all were answered. The patient agreed with the plan and demonstrated an understanding of the instructions.   The patient was advised to call back or seek an in-person evaluation if the  symptoms worsen or if the condition fails to improve as anticipated.  The above assessment and management plan was discussed with the patient. The patient verbalized understanding of and has agreed to the management plan. Patient is aware to call the clinic if symptoms persist or worsen. Patient is aware when to return to the clinic for a follow-up visit. Patient educated on when it is appropriate to go to the emergency department.    I provided 11 minutes of non-face-to-face  time during this encounter.    Nils Pyle, MD

## 2020-07-31 LAB — NOVEL CORONAVIRUS, NAA: SARS-CoV-2, NAA: DETECTED — AB

## 2020-07-31 LAB — SARS-COV-2, NAA 2 DAY TAT

## 2020-08-01 ENCOUNTER — Telehealth: Payer: Self-pay | Admitting: *Deleted

## 2020-08-01 ENCOUNTER — Telehealth: Payer: Self-pay | Admitting: Nurse Practitioner

## 2020-08-01 NOTE — Telephone Encounter (Signed)
Called to Discuss with patient about Covid symptoms and the use of the monoclonal antibody infusion for those with mild to moderate Covid symptoms and at a high risk of hospitalization.     Pt reports he does not know the exact date of symptom onset but does feel like it has been at least 2 weeks. Unfortunately given >10 days of symptom onset patient does not qualify for infusion.   Discussed symptom tiers, when to seek emergency assistance, and quarantine. He verbalized understanding and appreciation.    Willette Alma, NP [facility]  [phone]

## 2020-08-01 NOTE — Telephone Encounter (Signed)
LMTCB

## 2020-08-01 NOTE — Telephone Encounter (Signed)
Pt is wanting his covid results. Provider has not reviewed yet

## 2020-08-01 NOTE — Telephone Encounter (Signed)
Please inform that he is POSITIVE for COVID19.  Given age, he qualifies for possible MAB infusion if he is still within the window.  Please confirm onset of symptom date and let me know if he is amendable to referral for infusion.

## 2020-08-01 NOTE — Telephone Encounter (Signed)
Looks like Dr. Louanne Skye put him on antibiotics, steroids and Flonase.  Is there any specific symptoms that he is asking for OTC recommendations for?  Cough?  Body aches?  Diarrhea?  Nasal congestion?

## 2020-08-01 NOTE — Telephone Encounter (Signed)
He doesn't qualify it has been over 2 weeks with symptoms. He said he knew what to take otc.

## 2020-08-08 ENCOUNTER — Other Ambulatory Visit: Payer: Self-pay

## 2020-08-08 ENCOUNTER — Observation Stay (HOSPITAL_COMMUNITY): Payer: Medicare Other

## 2020-08-08 ENCOUNTER — Emergency Department (HOSPITAL_COMMUNITY): Payer: Medicare Other

## 2020-08-08 ENCOUNTER — Encounter (HOSPITAL_COMMUNITY): Payer: Self-pay | Admitting: Emergency Medicine

## 2020-08-08 ENCOUNTER — Inpatient Hospital Stay (HOSPITAL_COMMUNITY)
Admission: EM | Admit: 2020-08-08 | Discharge: 2020-08-15 | DRG: 177 | Disposition: A | Discharge status: Home Health | Payer: Medicare Other | Attending: Internal Medicine | Admitting: Internal Medicine

## 2020-08-08 DIAGNOSIS — R0602 Shortness of breath: Secondary | ICD-10-CM | POA: Diagnosis not present

## 2020-08-08 DIAGNOSIS — Z7401 Bed confinement status: Secondary | ICD-10-CM | POA: Diagnosis not present

## 2020-08-08 DIAGNOSIS — I129 Hypertensive chronic kidney disease with stage 1 through stage 4 chronic kidney disease, or unspecified chronic kidney disease: Secondary | ICD-10-CM | POA: Diagnosis present

## 2020-08-08 DIAGNOSIS — J1282 Pneumonia due to coronavirus disease 2019: Secondary | ICD-10-CM | POA: Diagnosis not present

## 2020-08-08 DIAGNOSIS — R1904 Left lower quadrant abdominal swelling, mass and lump: Secondary | ICD-10-CM | POA: Diagnosis not present

## 2020-08-08 DIAGNOSIS — I2699 Other pulmonary embolism without acute cor pulmonale: Secondary | ICD-10-CM

## 2020-08-08 DIAGNOSIS — I712 Thoracic aortic aneurysm, without rupture: Secondary | ICD-10-CM | POA: Diagnosis not present

## 2020-08-08 DIAGNOSIS — R059 Cough, unspecified: Secondary | ICD-10-CM | POA: Diagnosis not present

## 2020-08-08 DIAGNOSIS — Z87891 Personal history of nicotine dependence: Secondary | ICD-10-CM

## 2020-08-08 DIAGNOSIS — E785 Hyperlipidemia, unspecified: Secondary | ICD-10-CM | POA: Diagnosis present

## 2020-08-08 DIAGNOSIS — J9601 Acute respiratory failure with hypoxia: Secondary | ICD-10-CM | POA: Diagnosis present

## 2020-08-08 DIAGNOSIS — R7401 Elevation of levels of liver transaminase levels: Secondary | ICD-10-CM | POA: Diagnosis not present

## 2020-08-08 DIAGNOSIS — R1900 Intra-abdominal and pelvic swelling, mass and lump, unspecified site: Secondary | ICD-10-CM

## 2020-08-08 DIAGNOSIS — E440 Moderate protein-calorie malnutrition: Secondary | ICD-10-CM | POA: Diagnosis present

## 2020-08-08 DIAGNOSIS — E875 Hyperkalemia: Secondary | ICD-10-CM | POA: Diagnosis present

## 2020-08-08 DIAGNOSIS — I2693 Single subsegmental pulmonary embolism without acute cor pulmonale: Secondary | ICD-10-CM | POA: Diagnosis present

## 2020-08-08 DIAGNOSIS — I10 Essential (primary) hypertension: Secondary | ICD-10-CM | POA: Diagnosis not present

## 2020-08-08 DIAGNOSIS — R109 Unspecified abdominal pain: Secondary | ICD-10-CM | POA: Diagnosis not present

## 2020-08-08 DIAGNOSIS — R000 Tachycardia, unspecified: Secondary | ICD-10-CM | POA: Diagnosis not present

## 2020-08-08 DIAGNOSIS — K661 Hemoperitoneum: Secondary | ICD-10-CM | POA: Diagnosis not present

## 2020-08-08 DIAGNOSIS — Z79899 Other long term (current) drug therapy: Secondary | ICD-10-CM

## 2020-08-08 DIAGNOSIS — N1831 Chronic kidney disease, stage 3a: Secondary | ICD-10-CM | POA: Diagnosis present

## 2020-08-08 DIAGNOSIS — E871 Hypo-osmolality and hyponatremia: Secondary | ICD-10-CM | POA: Diagnosis not present

## 2020-08-08 DIAGNOSIS — I723 Aneurysm of iliac artery: Secondary | ICD-10-CM | POA: Diagnosis not present

## 2020-08-08 DIAGNOSIS — U071 COVID-19: Secondary | ICD-10-CM | POA: Diagnosis not present

## 2020-08-08 DIAGNOSIS — R7989 Other specified abnormal findings of blood chemistry: Secondary | ICD-10-CM

## 2020-08-08 DIAGNOSIS — J810 Acute pulmonary edema: Secondary | ICD-10-CM | POA: Diagnosis present

## 2020-08-08 DIAGNOSIS — M2550 Pain in unspecified joint: Secondary | ICD-10-CM | POA: Diagnosis not present

## 2020-08-08 DIAGNOSIS — R5381 Other malaise: Secondary | ICD-10-CM | POA: Diagnosis not present

## 2020-08-08 DIAGNOSIS — R0902 Hypoxemia: Secondary | ICD-10-CM | POA: Diagnosis not present

## 2020-08-08 DIAGNOSIS — E7800 Pure hypercholesterolemia, unspecified: Secondary | ICD-10-CM | POA: Diagnosis present

## 2020-08-08 LAB — CBC WITH DIFFERENTIAL/PLATELET
Abs Immature Granulocytes: 0.07 10*3/uL (ref 0.00–0.07)
Basophils Absolute: 0 10*3/uL (ref 0.0–0.1)
Basophils Relative: 1 %
Eosinophils Absolute: 0.1 10*3/uL (ref 0.0–0.5)
Eosinophils Relative: 1 %
HCT: 42.2 % (ref 39.0–52.0)
Hemoglobin: 13.7 g/dL (ref 13.0–17.0)
Immature Granulocytes: 1 %
Lymphocytes Relative: 12 %
Lymphs Abs: 0.8 10*3/uL (ref 0.7–4.0)
MCH: 29.3 pg (ref 26.0–34.0)
MCHC: 32.5 g/dL (ref 30.0–36.0)
MCV: 90.2 fL (ref 80.0–100.0)
Monocytes Absolute: 0.7 10*3/uL (ref 0.1–1.0)
Monocytes Relative: 11 %
Neutro Abs: 5.3 10*3/uL (ref 1.7–7.7)
Neutrophils Relative %: 74 %
Platelets: 216 10*3/uL (ref 150–400)
RBC: 4.68 MIL/uL (ref 4.22–5.81)
RDW: 13.1 % (ref 11.5–15.5)
WBC: 7.1 10*3/uL (ref 4.0–10.5)
nRBC: 0 % (ref 0.0–0.2)

## 2020-08-08 LAB — COMPREHENSIVE METABOLIC PANEL
ALT: 68 U/L — ABNORMAL HIGH (ref 0–44)
AST: 52 U/L — ABNORMAL HIGH (ref 15–41)
Albumin: 2.5 g/dL — ABNORMAL LOW (ref 3.5–5.0)
Alkaline Phosphatase: 44 U/L (ref 38–126)
Anion gap: 13 (ref 5–15)
BUN: 18 mg/dL (ref 8–23)
CO2: 22 mmol/L (ref 22–32)
Calcium: 8.2 mg/dL — ABNORMAL LOW (ref 8.9–10.3)
Chloride: 98 mmol/L (ref 98–111)
Creatinine, Ser: 1.45 mg/dL — ABNORMAL HIGH (ref 0.61–1.24)
GFR, Estimated: 53 mL/min — ABNORMAL LOW (ref >60)
Glucose, Bld: 123 mg/dL — ABNORMAL HIGH (ref 70–99)
Potassium: 4.1 mmol/L (ref 3.5–5.1)
Sodium: 133 mmol/L — ABNORMAL LOW (ref 135–145)
Total Bilirubin: 1.1 mg/dL (ref 0.3–1.2)
Total Protein: 6.7 g/dL (ref 6.5–8.1)

## 2020-08-08 LAB — HEPARIN LEVEL (UNFRACTIONATED)
Heparin Unfractionated: 0.23 IU/mL — ABNORMAL LOW (ref 0.30–0.70)
Heparin Unfractionated: 0.46 IU/mL (ref 0.30–0.70)

## 2020-08-08 LAB — LACTATE DEHYDROGENASE: LDH: 341 U/L — ABNORMAL HIGH (ref 98–192)

## 2020-08-08 LAB — TRIGLYCERIDES: Triglycerides: 108 mg/dL (ref <150)

## 2020-08-08 LAB — LACTIC ACID, PLASMA: Lactic Acid, Venous: 1.6 mmol/L (ref 0.5–1.9)

## 2020-08-08 LAB — PROCALCITONIN: Procalcitonin: 0.19 ng/mL

## 2020-08-08 LAB — FERRITIN: Ferritin: 1192 ng/mL — ABNORMAL HIGH (ref 24–336)

## 2020-08-08 LAB — D-DIMER, QUANTITATIVE: D-Dimer, Quant: >20 ug/mL-FEU — ABNORMAL HIGH (ref 0.00–0.50)

## 2020-08-08 LAB — C-REACTIVE PROTEIN: CRP: 17.6 mg/dL — ABNORMAL HIGH (ref <1.0)

## 2020-08-08 LAB — FIBRINOGEN: Fibrinogen: 467 mg/dL (ref 210–475)

## 2020-08-08 MED ORDER — BARICITINIB 2 MG PO TABS
2.0000 mg | ORAL_TABLET | Freq: Every day | ORAL | Status: DC
  Administered 2020-08-08 – 2020-08-15 (×8): 2 mg via ORAL
  Filled 2020-08-08 (×8): qty 1

## 2020-08-08 MED ORDER — PROCHLORPERAZINE EDISYLATE 10 MG/2ML IJ SOLN: 5.0000 mg | INTRAMUSCULAR | Status: DC | PRN

## 2020-08-08 MED ORDER — IOHEXOL 350 MG/ML SOLN
100.0000 mL | Freq: Once | INTRAVENOUS | Status: AC | PRN
  Administered 2020-08-08: 07:00:00 100 mL via INTRAVENOUS

## 2020-08-08 MED ORDER — ENOXAPARIN SODIUM 40 MG/0.4ML ~~LOC~~ SOLN
40.0000 mg | SUBCUTANEOUS | Status: DC
  Filled 2020-08-08: qty 0.4

## 2020-08-08 MED ORDER — ALBUTEROL SULFATE HFA 108 (90 BASE) MCG/ACT IN AERS
2.0000 | INHALATION_SPRAY | Freq: Four times a day (QID) | RESPIRATORY_TRACT | Status: DC
  Administered 2020-08-08 – 2020-08-11 (×13): 2 via RESPIRATORY_TRACT
  Filled 2020-08-08 (×3): qty 6.7

## 2020-08-08 MED ORDER — HEPARIN (PORCINE) 25000 UT/250ML-% IV SOLN
1450.0000 [IU]/h | INTRAVENOUS | Status: DC
  Administered 2020-08-08: 08:00:00 1450 [IU]/h via INTRAVENOUS
  Filled 2020-08-08: qty 250

## 2020-08-08 MED ORDER — SODIUM CHLORIDE 0.9 % IV SOLN
100.0000 mg | Freq: Once | INTRAVENOUS | Status: AC
  Administered 2020-08-08: 06:00:00 100 mg via INTRAVENOUS
  Filled 2020-08-08: qty 20

## 2020-08-08 MED ORDER — SODIUM CHLORIDE 0.9 % IV SOLN
100.0000 mg | Freq: Once | INTRAVENOUS | Status: AC
  Administered 2020-08-08: 08:00:00 100 mg via INTRAVENOUS
  Filled 2020-08-08: qty 20

## 2020-08-08 MED ORDER — SODIUM CHLORIDE 0.9 % IV SOLN
100.0000 mg | Freq: Every day | INTRAVENOUS | Status: AC
  Administered 2020-08-09 – 2020-08-12 (×4): 100 mg via INTRAVENOUS
  Filled 2020-08-08 (×4): qty 20

## 2020-08-08 MED ORDER — ZINC SULFATE 220 (50 ZN) MG PO CAPS
220.0000 mg | ORAL_CAPSULE | Freq: Every day | ORAL | Status: DC
  Administered 2020-08-08 – 2020-08-15 (×8): 220 mg via ORAL
  Filled 2020-08-08 (×8): qty 1

## 2020-08-08 MED ORDER — ACETAMINOPHEN 325 MG PO TABS
650.0000 mg | ORAL_TABLET | Freq: Four times a day (QID) | ORAL | Status: DC | PRN
  Administered 2020-08-12: 650 mg via ORAL
  Filled 2020-08-08 (×3): qty 2

## 2020-08-08 MED ORDER — SODIUM CHLORIDE 0.9 % IV SOLN: 200.0000 mg | Freq: Once | INTRAVENOUS | Status: DC

## 2020-08-08 MED ORDER — GUAIFENESIN-DM 100-10 MG/5ML PO SYRP
10.0000 mL | ORAL_SOLUTION | ORAL | Status: DC | PRN
  Administered 2020-08-10 – 2020-08-15 (×2): 10 mL via ORAL
  Filled 2020-08-08 (×2): qty 10

## 2020-08-08 MED ORDER — HEPARIN BOLUS VIA INFUSION
5000.0000 [IU] | Freq: Once | INTRAVENOUS | Status: AC
  Administered 2020-08-08: 08:00:00 5000 [IU] via INTRAVENOUS

## 2020-08-08 MED ORDER — SODIUM CHLORIDE 0.9% FLUSH
3.0000 mL | Freq: Two times a day (BID) | INTRAVENOUS | Status: DC
  Administered 2020-08-08 – 2020-08-15 (×13): 3 mL via INTRAVENOUS

## 2020-08-08 MED ORDER — LISINOPRIL 10 MG PO TABS
10.0000 mg | ORAL_TABLET | Freq: Every day | ORAL | Status: DC
  Administered 2020-08-08 – 2020-08-10 (×3): 10 mg via ORAL
  Filled 2020-08-08 (×3): qty 1

## 2020-08-08 MED ORDER — METOPROLOL TARTRATE 25 MG PO TABS
25.0000 mg | ORAL_TABLET | Freq: Two times a day (BID) | ORAL | Status: DC
  Administered 2020-08-08 – 2020-08-15 (×15): 25 mg via ORAL
  Filled 2020-08-08 (×16): qty 1

## 2020-08-08 MED ORDER — SODIUM CHLORIDE 0.9 % IV SOLN: 100.0000 mg | Freq: Every day | INTRAVENOUS | Status: DC

## 2020-08-08 MED ORDER — METOPROLOL TARTRATE 50 MG PO TABS
100.0000 mg | ORAL_TABLET | Freq: Two times a day (BID) | ORAL | Status: DC
  Administered 2020-08-08 – 2020-08-11 (×7): 100 mg via ORAL
  Administered 2020-08-11 – 2020-08-12 (×2): 50 mg via ORAL
  Administered 2020-08-12 – 2020-08-15 (×6): 100 mg via ORAL
  Filled 2020-08-08 (×16): qty 2

## 2020-08-08 MED ORDER — ASPIRIN 81 MG PO CHEW
81.0000 mg | CHEWABLE_TABLET | Freq: Every day | ORAL | Status: DC
  Administered 2020-08-08 – 2020-08-13 (×6): 81 mg via ORAL
  Filled 2020-08-08 (×6): qty 1

## 2020-08-08 MED ORDER — DEXAMETHASONE SODIUM PHOSPHATE 10 MG/ML IJ SOLN
6.0000 mg | INTRAMUSCULAR | Status: DC
Start: 2020-08-08 — End: 2020-08-09
  Administered 2020-08-08 – 2020-08-09 (×2): 6 mg via INTRAVENOUS
  Filled 2020-08-08 (×2): qty 1

## 2020-08-08 MED ORDER — ACETAMINOPHEN 650 MG RE SUPP: 650.0000 mg | Freq: Four times a day (QID) | RECTAL | Status: DC | PRN

## 2020-08-08 MED ORDER — HYDROCOD POLST-CPM POLST ER 10-8 MG/5ML PO SUER
5.0000 mL | Freq: Two times a day (BID) | ORAL | Status: DC | PRN
  Administered 2020-08-09 – 2020-08-14 (×2): 5 mL via ORAL
  Filled 2020-08-08 (×2): qty 5

## 2020-08-08 MED ORDER — SODIUM CHLORIDE 0.9 % IV SOLN
250.0000 mL | INTRAVENOUS | Status: DC | PRN
Start: 2020-08-08 — End: 2020-08-16

## 2020-08-08 MED ORDER — SODIUM CHLORIDE 0.9% FLUSH
3.0000 mL | INTRAVENOUS | Status: DC | PRN
Start: 2020-08-08 — End: 2020-08-16

## 2020-08-08 MED ORDER — ASCORBIC ACID 500 MG PO TABS
500.0000 mg | ORAL_TABLET | Freq: Every day | ORAL | Status: DC
  Administered 2020-08-08 – 2020-08-15 (×8): 500 mg via ORAL
  Filled 2020-08-08 (×8): qty 1

## 2020-08-08 MED ORDER — HEPARIN (PORCINE) 25000 UT/250ML-% IV SOLN
1650.0000 [IU]/h | INTRAVENOUS | Status: DC
  Administered 2020-08-08 – 2020-08-10 (×3): 1650 [IU]/h via INTRAVENOUS
  Filled 2020-08-08 (×3): qty 250

## 2020-08-08 NOTE — ED Notes (Signed)
Report given to Katie, RN.

## 2020-08-08 NOTE — H&P (Signed)
History and Physical    Nicholas Palmer EXH:[id number] DOB: 27-Jan-1956 DOA: 08/08/2020  PCP: Dettinger, Fransisca Kaufmann, MD   Patient coming from: Home.  I have personally briefly reviewed patient's old medical records in [place]  Chief Complaint: Shortness of breath.  HPI: Nicholas Palmer is a 64 y.o. male with medical history significant of arctic dissection, hypertension, nephrolithiasis, hyperlipidemia who tested positive for COVID-19 9 days ago and is coming to the emergency department with progressively worse dyspnea, fatigue, malaise, body aches and confusion.  He is unvaccinated.  He denies chest pain, palpitations, dizziness, PND, orthopnea or pitting edema of the lower extremities.  No abdominal pain, nausea, vomiting, constipation, melena or hematochezia.  No dysuria, frequency or hematuria.  ED Course: 98.3 F, pulse 114, respiration 18, BP 138/81 mmHg O2 sat 83% on room air.  His O2 sat improved to 96% on 3 LPM via [place].  Labwork: CBC was normal.  D-dimer was more than 20.00 mcg milliliter.  Normal lactic acid, fibrinogen and procalcitonin.  CMP showed a sodium 133 mmol/L, all other electrolytes are within expected range after calcium is corrected to albumin.  Glucose 123, BUN 18 and creatinine 1.45 mg/dL.  LFTs show an albumin of 2.5 g/dL, AST of 52 and ALT of 68 units/L.  Normal total protein, alk phos and total bilirubin.  Imaging: Portable one view chest radiograph show extensive bilateral asymmetric pulmonary infiltrates.  Please see image and full radiology report for further detail.  Review of Systems: As per HPI otherwise all other systems reviewed and are negative.  Past Medical History:  Diagnosis Date  . Aortic dissection [facility]) June 10, 2010   2011 status post repair of type  aortic dissection and aortic valve resuspension  . Decreased left ventricular function June 10, 2010   EF of 40-45% w/ out regional wall motion abnormalities 2011  EF 60%echo 2014  .  Hypertension   . Nephrolithiasis    has history; status post stone removal  . Tobacco abuse     Past Surgical History:  Procedure Laterality Date  . 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  06/10/10 Dr. Servando Snare    Social History  reports that he quit smoking about 18 years ago. He has never used smokeless tobacco. He reports that he does not drink alcohol and does not use drugs.  Allergies  Allergen Reactions  . Codeine Nausea And Vomiting  . Quinolones     Patient was warned about not using Cipro and similar antibiotics. Recent studies have raised concern that fluoroquinolone antibiotics could be associated with an increased risk of aortic aneurysm Fluoroquinolones have non-antimicrobial properties that might jeopardise the integrity of the extracellular matrix of the vascular wall In a  propensity score matched cohort study in [place], there was a 66% increased rate of aortic aneurysm or dissection associated with oral fluoroquinolone use, compared wit    Family History  Problem Relation Age of Onset  . Lung disease Mother        copd  . Heart disease Father   . Heart disease Brother   . Kidney disease Sister   . Stroke Sister    Prior to Admission medications   Medication Sig Start Date End Date Taking? Authorizing Provider  aspirin 81 MG tablet Take 1 tablet (81 mg total) by mouth daily. 06/23/18  Yes [name], MD  lisinopril (ZESTRIL) 10 MG tablet Take 1  tablet (10 mg total) by mouth daily. 08/17/19  Yes [name], MD  amoxicillin-clavulanate (AUGMENTIN) 875-125 MG tablet Take 1 tablet by mouth 2 (two) times daily. 07/30/20   Dettinger, Fransisca Kaufmann, MD  fluticasone (FLONASE) 50 MCG/ACT nasal spray Place 1 spray into both nostrils 2 (two) times daily as needed for allergies or rhinitis. 07/30/20   Dettinger, Fransisca Kaufmann, MD  metoprolol tartrate (LOPRESSOR) 100 MG tablet Take 1 tablet (100  mg total) by mouth 2 (two) times daily. In addition to 85m twice daily 08/17/19   HMinus Breeding MD  metoprolol tartrate (LOPRESSOR) 25 MG tablet Take 1 tablet (25 mg total) by mouth 2 (two) times daily. Takes in addition to the 1060mtwice a day. 08/17/19   HoMinus BreedingMD  predniSONE (DELTASONE) 20 MG tablet 2 po at same time daily for 5 days 07/30/20   Dettinger, JoFransisca KaufmannMD  simvastatin (ZOCOR) 20 MG tablet Take 1 tablet (20 mg total) by mouth at bedtime. 08/17/19   HoMinus BreedingMD   Physical Exam: Vitals:   08/08/20 0242 08/08/20 0244 08/08/20 0430  BP: 138/81  128/84  Pulse: (!) 114  95  Resp: 18  (!) 27  Temp: 98.3 F (36.8 C)    TempSrc: Oral    SpO2: (!) 83%  96%  Weight:  89.8 kg   Height:  5' 9"  (1.753 m)    Constitutional: Looks acutely ill. Eyes: PERRL, lids and conjunctivae mildly injected. ENMT: Mucous membranes are dry. Posterior pharynx clear of any exudate or lesions. Neck: normal, supple, no masses, no thyromegaly Respiratory: Tachypneic in the low 20s.  Decreased breath sounds on bases with bilateral scattered crackles.  No accessory muscle use.  Cardiovascular: Regular rate and rhythm, no murmurs / rubs / gallops. No extremity edema. 2+ pedal pulses. No carotid bruits.  Abdomen: Obese, no distention.  Bowel sounds positive.  Soft, no tenderness, no masses palpated. No hepatosplenomegaly. Musculoskeletal: Generalized weakness.  No clubbing / cyanosis.  Good ROM, no contractures. Normal muscle tone.  Skin: no rashes, lesions, ulcers on very limited dermatological examination. Neurologic: CN 2-12 grossly intact. Sensation intact, DTR normal. Strength 5/5 in all 4.  Psychiatric: Normal judgment and insight. Alert and oriented x 3. Normal mood.   Labs on Admission: I have personally reviewed following labs and imaging studies  CBC: Recent Labs  Lab 08/08/20 0332  WBC 7.1  NEUTROABS 5.3  HGB 13.7  HCT 42.2  MCV 90.2  PLT 21259  Basic Metabolic  Panel: Recent Labs  Lab 08/08/20 0332  NA 133*  K 4.1  CL 98  CO2 22  GLUCOSE 123*  BUN 18  CREATININE 1.45*  CALCIUM 8.2*    GFR: Estimated Creatinine Clearance: 56.3 mL/min (A) (by C-G formula based on SCr of 1.45 mg/dL (H)).  Liver Function Tests: Recent Labs  Lab 08/08/20 0332  AST 52*  ALT 68*  ALKPHOS 44  BILITOT 1.1  PROT 6.7  ALBUMIN 2.5*  Just saying that that I have sent in for you Radiological Exams on Admission: DG Chest Port 1 View  Result Date: 08/08/2020 CLINICAL DATA:  Dyspnea EXAM: PORTABLE CHEST 1 VIEW COMPARISON:  07/11/2010 FINDINGS: The lungs are symmetrically well expanded. There are extensive bilateral asymmetric pulmonary infiltrates, in keeping with atypical infection in the appropriate clinical setting. No pneumothorax or pleural effusion. Cardiac size within normal limits. Median sternotomy has been performed. No acute bone abnormality. IMPRESSION: Extensive bilateral asymmetric pulmonary infiltrates most in keeping with atypical  infection in the appropriate clinical setting. Electronically Signed   By: Fidela Salisbury MD   On: 08/08/2020 03:56   EKG: Independently reviewed.  Vent. rate 103 BPM PR interval * ms QRS duration 72 ms QT/QTc 341/447 ms P-R-T axes 27 20 47 Sinus tachycardia Probable left atrial enlargement Abnormal R-wave progression, early transition Minimal ST depression, lateral leads  Assessment/Plan Principal Problem:   Pneumonia due to COVID-19 virus Admit to telemetry/observation. Continue supplemental oxygen. Flutter valve and incentive spirometry. Continue bronchodilators. Antitussives as needed. Antiemetics as needed. Acetaminophen as needed. Remdesivir per pharmacy. Dexamethasone 6 mg IVP every 24 hours. Follow-up CBC, CMP and inflammatory markers.  Active Problems:   Essential hypertension Continue metoprolol 125 mg p.o. twice daily. Monitor blood pressure and heart rate.    Hypercholesterolemia Holding  statin due to transaminitis.    Transaminitis Hold simvastatin. Follow-up LFTs. Further work-up if there is worsening.    Moderate protein malnutrition ([place]) In the setting of COVID-19 pneumonia. Protein nutritional supplementation.    DVT prophylaxis: Lovenox SQ. Code Status:   Full code. Family Communication: Disposition Plan:   Patient is from:  Home.  Anticipated DC to:  Home.  Anticipated DC date:  08/09/2020  Anticipated DC barriers: Clinical status. Consults called: Admission status:  Observation/telemetry.   Severity of Illness: High due to COVID-19 pneumonia on an unvaccinated patient with multiple risk for severe disease.  Reubin Milan MD [facility]  How to contact the [facility] Attending or Consulting provider [place] or covering provider during after hours [place], for this patient?   1. Check the care team in [facility] and look for a) attending/consulting [facility] provider listed and b) the [facility] team listed 2. Log into [email or web address] and use [place]'s universal password to access. If you do not have the password, please contact the hospital operator. 3. Locate the [facility] provider you are looking for under [facility] and page to a number that you can be directly reached. 4. If you still have difficulty reaching the provider, please page the [facility] (Director on Call) for the Hospitalists listed on amion for assistance.  08/08/2020, 5:48 AM   This document was prepared using Dragon voice recognition software and may contain some unintended transcription errors.

## 2020-08-08 NOTE — Progress Notes (Signed)
ANTICOAGULATION CONSULT NOTE - Initial Consult  Pharmacy Consult for heparin Indication: pulmonary embolus  Allergies  Allergen Reactions  . Codeine Nausea And Vomiting  . Quinolones     Patient was warned about not using Cipro and similar antibiotics. Recent studies have raised concern that fluoroquinolone antibiotics could be associated with an increased risk of aortic aneurysm Fluoroquinolones have non-antimicrobial properties that might jeopardise the integrity of the extracellular matrix of the vascular wall In a  propensity score matched cohort study in [place], there was a 66% increased rate of aortic aneurysm or dissection associated with oral fluoroquinolone use, compared wit    Patient Measurements: Height: 5\' 9"  (175.3 cm) Weight: 89.8 kg (198 lb) IBW/kg (Calculated) : 70.7 Heparin Dosing Weight: 89 kg  Vital Signs: Temp: 98.3 F (36.8 C) (12/29 0242) Temp Source: Oral (12/29 0242) BP: 139/84 (12/29 0600) Pulse Rate: 95 (12/29 0600)  Labs: Recent Labs    08/08/20 0332  HGB 13.7  HCT 42.2  PLT 216  CREATININE 1.45*    Estimated Creatinine Clearance: 56.3 mL/min (A) (by C-G formula based on SCr of 1.45 mg/dL (H)).   Medical History: Past Medical History:  Diagnosis Date  . 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 Decreased left ventricular function June 10, 2010   EF of 40-45% w/ out regional wall motion abnormalities 2011  EF 60%echo 2014  . Hypertension   . Nephrolithiasis    has history; status post stone removal  . Tobacco abuse     Medications:  (Not in a hospital admission)   Assessment: Pharmacy consulted to dose heparin in patient with PE. Patient with D-Dimer > 20.  CT angio positive for pulmonary embolus.  Goal of Therapy:  Heparin level 0.3-0.7 units/ml Monitor platelets by anticoagulation protocol: Yes   Plan:  Give 5000 units bolus x 1 Start  heparin infusion at 1450 units/hr Check anti-Xa level in 6 hours and daily while on heparin Continue to monitor H&H and platelets  2012 Lizzete Gough 08/08/2020,7:38 AM

## 2020-08-08 NOTE — ED Provider Notes (Addendum)
[facility] EMERGENCY DEPARTMENT Provider Note   CSN: [id number] Arrival date & time: 08/08/20  2423     History Chief Complaint  Patient presents with  . Covid Positive    Nicholas Palmer is a 64 y.o. male.  Patient comes to the emergency department from home.  Patient reports that he tested positive for Covid on December 20.  He has been feeling sick ever since.  Patient reports confusion, generalized malaise and aching pains.  He has had a cough and now has been experiencing increasing shortness of breath.        Past Medical History:  Diagnosis Date  . 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 Decreased left ventricular function June 10, 2010   EF of 40-45% w/ out regional wall motion abnormalities 2011  EF 60%echo 2014  . Hypertension   . Nephrolithiasis    has history; status post stone removal  . Tobacco abuse     Patient Active Problem List   Diagnosis Date Noted  . Educated about COVID-19 virus infection 08/14/2019  . S/P aortic dissection repair 12/23/2017  . Hypercholesterolemia 06/17/2017  . Hematuria 04/03/2015  . Psoriasis 04/03/2015  . RENAL INSUFFICIENCY 07/17/2010  . Essential hypertension 07/16/2010    Past Surgical History:  Procedure Laterality Date  . 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  06/10/10 Dr. Tyrone Sage       Family History  Problem Relation Age of Onset  . Lung disease Mother        copd  . Heart disease Father   . Heart disease Brother   . Kidney disease Sister   . Stroke Sister     Social History   Tobacco Use  . Smoking status: Former Smoker    Quit date: 08/11/2002    Years since quitting: 18.0  . Smokeless tobacco: Never Used  Vaping Use  . Vaping Use: Never used  Substance Use Topics  . Alcohol use: No    Alcohol/week: 0.0 standard drinks   . Drug use: No    Home Medications Prior to Admission medications   Medication Sig Start Date End Date Taking? Authorizing Provider  amoxicillin-clavulanate (AUGMENTIN) 875-125 MG tablet Take 1 tablet by mouth 2 (two) times daily. 07/30/20   Dettinger, Elige Radon, MD  aspirin 81 MG tablet Take 1 tablet (81 mg total) by mouth daily. 06/23/18   Rollene Rotunda, MD  fluticasone (FLONASE) 50 MCG/ACT nasal spray Place 1 spray into both nostrils 2 (two) times daily as needed for allergies or rhinitis. 07/30/20   Dettinger, Elige Radon, MD  lisinopril (ZESTRIL) 10 MG tablet Take 1 tablet (10 mg total) by mouth daily. 08/17/19   Rollene Rotunda, MD  metoprolol tartrate (LOPRESSOR) 100 MG tablet Take 1 tablet (100 mg total) by mouth 2 (two) times daily. In addition to 25mg  twice daily 08/17/19   10/15/19, MD  metoprolol tartrate (LOPRESSOR) 25 MG tablet Take 1 tablet (25 mg total) by mouth 2 (two) times daily. Takes in addition to the 100mg  twice a day. 08/17/19   , MD  predniSONE (DELTASONE) 20 MG tablet 2 po at same time daily for 5 days 07/30/20   Dettinger, Rollene Rotunda, MD  simvastatin (ZOCOR) 20 MG tablet Take 1 tablet (20 mg total) by mouth at bedtime. 08/17/19   Elige Radon, MD  Allergies    Codeine and Quinolones  Review of Systems   Review of Systems  Constitutional: Positive for fatigue and fever.  HENT: Positive for congestion.   Respiratory: Positive for cough and shortness of breath.   All other systems reviewed and are negative.   Physical Exam Updated Vital Signs BP 138/81   Pulse (!) 114   Temp 98.3 F (36.8 C) (Oral)   Resp 18   Ht 5\' 9"  (1.753 m)   Wt 89.8 kg   SpO2 (!) 83%   BMI 29.24 kg/m   Physical Exam Vitals and nursing note reviewed.  Constitutional:      General: He is not in acute distress.    Appearance: Normal appearance. He is well-developed and well-nourished.  HENT:     Head: Normocephalic and atraumatic.     Right Ear: Hearing  normal.     Left Ear: Hearing normal.     Nose: Nose normal.     Mouth/Throat:     Mouth: Oropharynx is clear and moist and mucous membranes are normal.  Eyes:     Extraocular Movements: EOM normal.     Conjunctiva/sclera: Conjunctivae normal.     Pupils: Pupils are equal, round, and reactive to light.  Cardiovascular:     Rate and Rhythm: Regular rhythm.     Heart sounds: S1 normal and S2 normal. No murmur heard. No friction rub. No gallop.   Pulmonary:     Effort: Pulmonary effort is normal. No respiratory distress.     Breath sounds: Decreased breath sounds present.  Chest:     Chest wall: No tenderness.  Abdominal:     General: Bowel sounds are normal.     Palpations: Abdomen is soft. There is no hepatosplenomegaly.     Tenderness: There is no abdominal tenderness. There is no guarding or rebound. Negative signs include Murphy's sign and McBurney's sign.     Hernia: No hernia is present.  Musculoskeletal:        General: Normal range of motion.     Cervical back: Normal range of motion and neck supple.  Skin:    General: Skin is warm, dry and intact.     Findings: No rash.     Nails: There is no cyanosis.  Neurological:     Mental Status: He is alert and oriented to person, place, and time.     GCS: GCS eye subscore is 4. GCS verbal subscore is 5. GCS motor subscore is 6.     Cranial Nerves: No cranial nerve deficit.     Sensory: No sensory deficit.     Coordination: Coordination normal.     Deep Tendon Reflexes: Strength normal.  Psychiatric:        Mood and Affect: Mood and affect normal.        Speech: Speech normal.        Behavior: Behavior normal.        Thought Content: Thought content normal.     ED Results / Procedures / Treatments   Labs (all labs ordered are listed, but only abnormal results are displayed) Labs Reviewed  COMPREHENSIVE METABOLIC PANEL - Abnormal; Notable for the following components:      Result Value   Sodium 133 (*)    Glucose, Bld  123 (*)    Creatinine, Ser 1.45 (*)    Calcium 8.2 (*)    Albumin 2.5 (*)    AST 52 (*)    ALT 68 (*)  GFR, Estimated 53 (*)    All other components within normal limits  D-DIMER, QUANTITATIVE (NOT AT [facility]) - Abnormal; Notable for the following components:   D-Dimer, Quant >20.00 (*)    All other components within normal limits  LACTATE DEHYDROGENASE - Abnormal; Notable for the following components:   LDH 341 (*)    All other components within normal limits  CULTURE, BLOOD (ROUTINE X 2)  CULTURE, BLOOD (ROUTINE X 2)  LACTIC ACID, PLASMA  CBC WITH DIFFERENTIAL/PLATELET  TRIGLYCERIDES  FIBRINOGEN  LACTIC ACID, PLASMA  PROCALCITONIN  FERRITIN  C-REACTIVE PROTEIN    EKG EKG Interpretation  Date/Time:  Wednesday August 08 2020 02:56:07 EST Ventricular Rate:  103 PR Interval:    QRS Duration: 72 QT Interval:  341 QTC Calculation: 447 R Axis:   20 Text Interpretation: Sinus tachycardia Probable left atrial enlargement Abnormal R-wave progression, early transition Minimal ST depression, lateral leads Confirmed by Gilda Crease ([id number]) on 08/08/2020 3:20:47 AM   Radiology DG Chest Port 1 View  Result Date: 08/08/2020 CLINICAL DATA:  Dyspnea EXAM: PORTABLE CHEST 1 VIEW COMPARISON:  07/11/2010 FINDINGS: The lungs are symmetrically well expanded. There are extensive bilateral asymmetric pulmonary infiltrates, in keeping with atypical infection in the appropriate clinical setting. No pneumothorax or pleural effusion. Cardiac size within normal limits. Median sternotomy has been performed. No acute bone abnormality. IMPRESSION: Extensive bilateral asymmetric pulmonary infiltrates most in keeping with atypical infection in the appropriate clinical setting. Electronically Signed   By: Helyn Numbers MD   On: 08/08/2020 03:56    Procedures Procedures (including critical care time)  Medications Ordered in ED Medications  sodium chloride flush (NS) 0.9 % injection 3 mL (3  mLs Intravenous Not Given 08/08/20 0326)  sodium chloride flush (NS) 0.9 % injection 3 mL (has no administration in time range)  0.9 %  sodium chloride infusion (has no administration in time range)    ED Course  I have reviewed the triage vital signs and the nursing notes.  Pertinent labs & imaging results that were available during my care of the patient were reviewed by me and considered in my medical decision making (see chart for details).    MDM Rules/Calculators/A&P                          Patient tested positive for Covid 9 days ago.  He has had progressively worsening symptoms.  At arrival to the emergency department today his room air oxygen saturation was 83%.  He does not use oxygen at home.  His oxygen saturations have improved with supplemental oxygen.  He will require hospitalization for further management of hypoxia secondary to Covid infection.  Addendum: D-dimer is greater than 20.  This is likely secondary to inflammation from Covid (x-ray has extensive bilateral changes) but with increased risk of PE and Covid infection, will order PE study as well.  Addendum: Called by Dr. Margo Aye, radiology.  Patient does have evidence of segmental or subsegmental PE in the area of the lingula.  He has extensive Covid lung changes, however and this is likely the cause of the patient's hypoxia.  Will anticoagulate.  CRITICAL CARE Performed by: Gilda Crease   Total critical care time: 30 minutes  Critical care time was exclusive of separately billable procedures and treating other patients.  Critical care was necessary to treat or prevent imminent or life-threatening deterioration.  Critical care was time spent personally by me on the following  activities: development of treatment plan with patient and/or surrogate as well as nursing, discussions with consultants, evaluation of patient's response to treatment, examination of patient, obtaining history from patient or  surrogate, ordering and performing treatments and interventions, ordering and review of laboratory studies, ordering and review of radiographic studies, pulse oximetry and re-evaluation of patient's condition.  Final Clinical Impression(s) / ED Diagnoses Final diagnoses:  COVID-19    Rx / DC Orders ED Discharge Orders    None       Jamaica Inthavong, Canary Brimhristopher J, MD 08/08/20 [phone]    Gilda CreasePollina, Kentaro Alewine J, MD 08/08/20 [phone]    Gilda CreasePollina, Maynard David J, MD 08/08/20 [phone]

## 2020-08-08 NOTE — ED Triage Notes (Signed)
[facility] - pt arrives from home covid + on 12/21. When asked what brings the pt to the hospital he shrugs and states "I'm sick." pt c/o fatigue after questioning.

## 2020-08-08 NOTE — Progress Notes (Signed)
ANTICOAGULATION CONSULT NOTE -  Consult  Pharmacy Consult for heparin Indication: pulmonary embolus  Allergies  Allergen Reactions  . Codeine Nausea And Vomiting  . Quinolones     Patient was warned about not using Cipro and similar antibiotics. Recent studies have raised concern that fluoroquinolone antibiotics could be associated with an increased risk of aortic aneurysm Fluoroquinolones have non-antimicrobial properties that might jeopardise the integrity of the extracellular matrix of the vascular wall In a  propensity score matched cohort study in [place], there was a 66% increased rate of aortic aneurysm or dissection associated with oral fluoroquinolone use, compared wit    Patient Measurements: Height: 5\' 9"  (175.3 cm) Weight: 89.8 kg (198 lb) IBW/kg (Calculated) : 70.7 Heparin Dosing Weight: 89 kg  Vital Signs: Temp: 97.8 F (36.6 C) (12/29 1456) Temp Source: Oral (12/29 1456) BP: 128/73 (12/29 1456) Pulse Rate: 83 (12/29 1456)  Labs: Recent Labs    08/08/20 0332 08/08/20 1418  HGB 13.7  --   HCT 42.2  --   PLT 216  --   HEPARINUNFRC  --  0.23*  CREATININE 1.45*  --     Estimated Creatinine Clearance: 56.3 mL/min (A) (by C-G formula based on SCr of 1.45 mg/dL (H)).   Medical History: Past Medical History:  Diagnosis Date  . 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 Decreased left ventricular function June 10, 2010   EF of 40-45% w/ out regional wall motion abnormalities 2011  EF 60%echo 2014  . Hypertension   . Nephrolithiasis    has history; status post stone removal  . Tobacco abuse     Medications:  Medications Prior to Admission  Medication Sig Dispense Refill Last Dose  . aspirin 81 MG tablet Take 1 tablet (81 mg total) by mouth daily.   08/07/2020 at Unknown time  . lisinopril (ZESTRIL) 10 MG tablet Take 1 tablet (10 mg total) by mouth daily. 90 tablet 3  08/07/2020 at Unknown time  . amoxicillin-clavulanate (AUGMENTIN) 875-125 MG tablet Take 1 tablet by mouth 2 (two) times daily. 20 tablet 0   . fluticasone (FLONASE) 50 MCG/ACT nasal spray Place 1 spray into both nostrils 2 (two) times daily as needed for allergies or rhinitis. 16 g 6   . metoprolol tartrate (LOPRESSOR) 100 MG tablet Take 1 tablet (100 mg total) by mouth 2 (two) times daily. In addition to 25mg  twice daily 180 tablet 3   . metoprolol tartrate (LOPRESSOR) 25 MG tablet Take 1 tablet (25 mg total) by mouth 2 (two) times daily. Takes in addition to the 100mg  twice a day. 180 tablet 3   . predniSONE (DELTASONE) 20 MG tablet 2 po at same time daily for 5 days (Patient taking differently: Take 40 mg by mouth daily at 12 noon. Same time daily for 5 days) 10 tablet 0   . simvastatin (ZOCOR) 20 MG tablet Take 1 tablet (20 mg total) by mouth at bedtime. 90 tablet 3     Assessment: Pharmacy consulted to dose heparin in patient with PE. Patient with D-Dimer > 20.  CT angio positive for pulmonary embolus.  Today, 08/08/20  HL is 0.23, subtherapeutic  CBC WNL  SCr 1.45, CrCl ml/min  No line or bleeding issues per RN   Goal of Therapy:  Heparin level 0.3-0.7 units/ml Monitor platelets by anticoagulation protocol: Yes   Plan:   Increase heparin drip to 1650 units/hr  Obtain HL 6 hours after rate change  Daily CBC while on heparin  Monitor for signs and symptoms of bleeding   Adalberto Cole, PharmD, BCPS 08/08/2020 3:06 PM

## 2020-08-08 NOTE — Progress Notes (Signed)
Patient seen and examined; admitted after midnight secondary to acute respiratory failure with hypoxia in the setting of COVID-19 infection.  Patient also expressed increased short winded sensation with minimal activity, general malaise, chills, nonproductive cough and diffuse body aches.  Other than 17, D-dimer more than 20 and CT angiogram demonstrating bilateral diffuse groundglass opacities with positive segmental/subsegmental pulmonary embolism in the lingula.  Patient currently denies nausea, vomiting, chest pain, palpitations, dysuria, hematuria, melena or hematochezia.  Please refer to H&P written by Dr. Robb Matar for further info/details on admission.  Plan: -Continue to follow inflammatory markers -Continue heparin drip as part of anticoagulation management for acute pulmonary embolism -Continue remdesivir, steroids and baricitinib as part of treatment for COVID-19 infection -Continue the use of antitussive medications, for above and incentive respirometer. -Continue supportive care and wean down oxygen supplementation as tolerated. -Patient will be transferred to [facility] for further evaluation, management and care.  Vassie Loll MD [phone]

## 2020-08-08 NOTE — Progress Notes (Signed)
Brief Pharmacy Anti-coagulation Note:  For full details please see note from [facility] from earlier today  A/P: Heparin level 0.46, therapeutic on 1650 units/hr No bleeding or line issues noted Heparin level with am labs  Arley Phenix RPh 08/08/2020, 11:42 PM

## 2020-08-09 DIAGNOSIS — I10 Essential (primary) hypertension: Secondary | ICD-10-CM

## 2020-08-09 DIAGNOSIS — U071 COVID-19: Secondary | ICD-10-CM | POA: Diagnosis not present

## 2020-08-09 DIAGNOSIS — R7401 Elevation of levels of liver transaminase levels: Secondary | ICD-10-CM

## 2020-08-09 DIAGNOSIS — J9601 Acute respiratory failure with hypoxia: Secondary | ICD-10-CM | POA: Diagnosis not present

## 2020-08-09 LAB — CBC WITH DIFFERENTIAL/PLATELET
Abs Immature Granulocytes: 0.08 10*3/uL — ABNORMAL HIGH (ref 0.00–0.07)
Basophils Absolute: 0 10*3/uL (ref 0.0–0.1)
Basophils Relative: 0 %
Eosinophils Absolute: 0.1 10*3/uL (ref 0.0–0.5)
Eosinophils Relative: 1 %
HCT: 40.7 % (ref 39.0–52.0)
Hemoglobin: 13.2 g/dL (ref 13.0–17.0)
Immature Granulocytes: 1 %
Lymphocytes Relative: 16 %
Lymphs Abs: 1.4 10*3/uL (ref 0.7–4.0)
MCH: 29.1 pg (ref 26.0–34.0)
MCHC: 32.4 g/dL (ref 30.0–36.0)
MCV: 89.6 fL (ref 80.0–100.0)
Monocytes Absolute: 0.7 10*3/uL (ref 0.1–1.0)
Monocytes Relative: 8 %
Neutro Abs: 6.2 10*3/uL (ref 1.7–7.7)
Neutrophils Relative %: 74 %
Platelets: 245 10*3/uL (ref 150–400)
RBC: 4.54 MIL/uL (ref 4.22–5.81)
RDW: 12.9 % (ref 11.5–15.5)
WBC: 8.4 10*3/uL (ref 4.0–10.5)
nRBC: 0 % (ref 0.0–0.2)

## 2020-08-09 LAB — COMPREHENSIVE METABOLIC PANEL
ALT: 69 U/L — ABNORMAL HIGH (ref 0–44)
AST: 51 U/L — ABNORMAL HIGH (ref 15–41)
Albumin: 2.5 g/dL — ABNORMAL LOW (ref 3.5–5.0)
Alkaline Phosphatase: 43 U/L (ref 38–126)
Anion gap: 9 (ref 5–15)
BUN: 26 mg/dL — ABNORMAL HIGH (ref 8–23)
CO2: 24 mmol/L (ref 22–32)
Calcium: 8.4 mg/dL — ABNORMAL LOW (ref 8.9–10.3)
Chloride: 102 mmol/L (ref 98–111)
Creatinine, Ser: 1.48 mg/dL — ABNORMAL HIGH (ref 0.61–1.24)
GFR, Estimated: 52 mL/min — ABNORMAL LOW (ref >60)
Glucose, Bld: 106 mg/dL — ABNORMAL HIGH (ref 70–99)
Potassium: 4.7 mmol/L (ref 3.5–5.1)
Sodium: 135 mmol/L (ref 135–145)
Total Bilirubin: 0.6 mg/dL (ref 0.3–1.2)
Total Protein: 6.5 g/dL (ref 6.5–8.1)

## 2020-08-09 LAB — D-DIMER, QUANTITATIVE: D-Dimer, Quant: >20 ug/mL-FEU — ABNORMAL HIGH (ref 0.00–0.50)

## 2020-08-09 LAB — HIV ANTIBODY (ROUTINE TESTING W REFLEX): HIV Screen 4th Generation wRfx: NONREACTIVE

## 2020-08-09 LAB — C-REACTIVE PROTEIN: CRP: 10.5 mg/dL — ABNORMAL HIGH (ref <1.0)

## 2020-08-09 LAB — HEPARIN LEVEL (UNFRACTIONATED): Heparin Unfractionated: 0.54 IU/mL (ref 0.30–0.70)

## 2020-08-09 LAB — FERRITIN: Ferritin: 1076 ng/mL — ABNORMAL HIGH (ref 24–336)

## 2020-08-09 MED ORDER — FAMOTIDINE 20 MG PO TABS
20.0000 mg | ORAL_TABLET | Freq: Every day | ORAL | Status: DC
  Administered 2020-08-09 – 2020-08-15 (×7): 20 mg via ORAL
  Filled 2020-08-09 (×7): qty 1

## 2020-08-09 MED ORDER — METHYLPREDNISOLONE SODIUM SUCC 40 MG IJ SOLR
40.0000 mg | Freq: Two times a day (BID) | INTRAMUSCULAR | Status: DC
  Administered 2020-08-09 – 2020-08-11 (×3): 40 mg via INTRAVENOUS
  Filled 2020-08-09 (×3): qty 1

## 2020-08-09 NOTE — Plan of Care (Signed)

## 2020-08-09 NOTE — Progress Notes (Addendum)
PROGRESS NOTE  Nicholas Palmer [id number]:[id number] DOB: 09-30-1955 DOA: 08/08/2020  PCP: Dettinger, Elige Radon, MD  Brief History/Interval Summary: 64 year old male with past medical history of aortic dissection several years ago, hypertension, nephrolithiasis, hyperlipidemia who tested positive for COVID-19 about 9 days prior to admission came in with progressively worsening shortness of breath fatigue malaise body aches and confusion.  He is unvaccinated.  Patient underwent imaging studies.  PE was diagnosed.  He was also noted to have pneumonia due to COVID-19.  He was hospitalized for further management.  He was noted to be hypoxic.  Reason for Visit: Pneumonia due to COVID-19.  Acute respiratory failure with hypoxia  Consultants: None yet  Procedures: None yet  Antibiotics: Anti-infectives (From admission, onward)   Start     Dose/Rate Route Frequency Ordered Stop   08/09/20 1000  remdesivir 100 mg in sodium chloride 0.9 % 100 mL IVPB  Status:  Discontinued       "Followed by" Linked Group Details   100 mg 200 mL/hr over 30 Minutes Intravenous Daily 08/08/20 0512 08/08/20 0513   08/09/20 1000  remdesivir 100 mg in sodium chloride 0.9 % 100 mL IVPB       "Followed by" Linked Group Details   100 mg 200 mL/hr over 30 Minutes Intravenous Daily 08/08/20 0515 08/13/20 0959   08/08/20 0600  remdesivir 100 mg in sodium chloride 0.9 % 100 mL IVPB       "Followed by" Linked Group Details   100 mg 200 mL/hr over 30 Minutes Intravenous  Once 08/08/20 0515 08/08/20 0734   08/08/20 0515  remdesivir 200 mg in sodium chloride 0.9% 250 mL IVPB  Status:  Discontinued       "Followed by" Linked Group Details   200 mg 580 mL/hr over 30 Minutes Intravenous Once 08/08/20 0512 08/08/20 0513   08/08/20 0515  remdesivir 100 mg in sodium chloride 0.9 % 100 mL IVPB       "Followed by" Linked Group Details   100 mg 200 mL/hr over 30 Minutes Intravenous  Once 08/08/20 0515 08/08/20 0913       Subjective/Interval History: Patient mentions that he is feeling slightly better today compared to yesterday.  Shortness of breath is improving.  Denies any chest pain.  No nausea vomiting.     Assessment/Plan:  Acute Hypoxic Resp. Failure/Pneumonia due to COVID-19   Recent Labs  Lab 08/08/20 0332 08/09/20 0356  DDIMER >20.00* >20.00*  FERRITIN 1,192* 1,076*  CRP 17.6* 10.5*  ALT 68* 69*  PROCALCITON 0.19  --      Objective findings: Fever: Noted to be afebrile Oxygen requirements: Nasal cannula 4 L/min.  Saturating in the early 90s  COVID 19 Therapeutics: Antibacterials: None Remdesivir: Day 2  Steroids: Noted to be on dexamethasone.  Will change to Solu-Medrol Diuretics: None currently Inhaled Steroids: None currently Baricitinib: Started on baricitinib on 12/29 PUD Prophylaxis: Initiate Pepcid DVT Prophylaxis: On IV heparin for PE  Patient is unvaccinated.  From a respiratory standpoint patient seems to be stable.  He does not have increased work of breathing.  Inflammatory markers noted to be elevated the CRP is better today compared to yesterday.  D-dimer remains greater than 20.  He was found to have PE for which he is on IV heparin.  Continue with Remdesivir.  Changed dexamethasone to Solu-Medrol.  He is also on baricitinib which will be continued.  Continue to trend inflammatory markers.  Incentive spirometry mobilization prone positioning.  The treatment plan  and use of medications and known side effects were discussed with patient/family. Some of the medications used are based on case reports/anecdotal data.  All other medications being used in the management of COVID-19 based on limited study data.  Complete risks and long-term side effects are unknown, however in the best clinical judgment they seem to be of some benefit.  Patient/family wanted to proceed with treatment options provided.  Acute pulmonary embolism Currently on IV heparin.  Seems to be  stable.  No concern for heart strain noted on CT scan.  Can hold off on echocardiogram for now.  Transaminitis Mild elevation in AST ALT noted.  Likely due to COVID-19.  Continue to trend.  Essential hypertension Continue metoprolol and ACEI.  Monitor blood pressures closely.  Chronic kidney disease stage IIIa Baseline creatinine appears to be around 1.5.  Creatinine noted to be at baseline.  Okay to continue the ACE inhibitor for now.  Hyperlipidemia Holding statin due to transaminitis.  History of aortic dissection This was several years ago.  Chronic dissection noted on CT scan.  DVT Prophylaxis: Currently on IV heparin Code Status: Full code Family Communication: Discussed with the patient Disposition Plan: Hopefully return home when improved  Status is: Inpatient  Remains inpatient appropriate because:IV treatments appropriate due to intensity of illness or inability to take PO and Inpatient level of care appropriate due to severity of illness   Dispo: The patient is from: Home              Anticipated d/c is to: Home              Anticipated d/c date is: 3 days              Patient currently is not medically stable to d/c.      Medications:  Scheduled: . albuterol  2 puff Inhalation Q6H  . vitamin C  500 mg Oral Daily  . aspirin  81 mg Oral Daily  . baricitinib  2 mg Oral Daily  . dexamethasone (DECADRON) injection  6 mg Intravenous Q24H  . lisinopril  10 mg Oral Daily  . metoprolol tartrate  100 mg Oral BID  . metoprolol tartrate  25 mg Oral BID  . sodium chloride flush  3 mL Intravenous Q12H  . zinc sulfate  220 mg Oral Daily   Continuous: . sodium chloride    . heparin 1,650 Units/hr (08/09/20 1127)  . remdesivir 100 mg in NS 100 mL 100 mg (08/09/20 1100)   IRC:VELFYB chloride, acetaminophen **OR** acetaminophen, chlorpheniramine-HYDROcodone, guaiFENesin-dextromethorphan, prochlorperazine, sodium chloride flush   Objective:  Vital Signs  Vitals:    08/08/20 1519 08/08/20 1903 08/08/20 2258 08/09/20 0541  BP: 114/70 122/65 122/75 129/72  Pulse: 83 97 84 86  Resp: (!) 22 18 19 20   Temp: 97.6 F (36.4 C) 97.8 F (36.6 C) 97.6 F (36.4 C) 97.6 F (36.4 C)  TempSrc:  Oral Oral Oral  SpO2: 94% (!) 89% 97% 93%  Weight:      Height:        Intake/Output Summary (Last 24 hours) at 08/09/2020 1241 Last data filed at 08/09/2020 0900 Gross per 24 hour  Intake 633.62 ml  Output 700 ml  Net -66.38 ml   Filed Weights   08/08/20 0244  Weight: 89.8 kg    General appearance: Awake alert.  In no distress Resp: Mildly tachypneic.  Coarse breath sounds with crackles bilateral bases.  No wheezing or rhonchi. Cardio: S1-S2 is normal regular.  No S3-S4.  No rubs murmurs or bruit GI: Abdomen is soft.  Nontender nondistended.  Bowel sounds are present normal.  No masses organomegaly Extremities: No edema.  Full range of motion of lower extremities. Neurologic: Alert and oriented x3.  No focal neurological deficits.    Lab Results:  Data Reviewed: I have personally reviewed following labs and imaging studies  CBC: Recent Labs  Lab 08/08/20 0332 08/09/20 0356  WBC 7.1 8.4  NEUTROABS 5.3 6.2  HGB 13.7 13.2  HCT 42.2 40.7  MCV 90.2 89.6  PLT 216 245    Basic Metabolic Panel: Recent Labs  Lab 08/08/20 0332 08/09/20 0356  NA 133* 135  K 4.1 4.7  CL 98 102  CO2 22 24  GLUCOSE 123* 106*  BUN 18 26*  CREATININE 1.45* 1.48*  CALCIUM 8.2* 8.4*    GFR: Estimated Creatinine Clearance: 55.1 mL/min (A) (by C-G formula based on SCr of 1.48 mg/dL (H)).  Liver Function Tests: Recent Labs  Lab 08/08/20 0332 08/09/20 0356  AST 52* 51*  ALT 68* 69*  ALKPHOS 44 43  BILITOT 1.1 0.6  PROT 6.7 6.5  ALBUMIN 2.5* 2.5*    Lipid Profile: Recent Labs    08/08/20 0332  TRIG 108    Anemia Panel: Recent Labs    08/08/20 0332 08/09/20 0356  FERRITIN 1,192* 1,076*    Recent Results (from the past 240 hour(s))  Novel  Coronavirus, NAA ([facility])     Status: Abnormal   Collection Time: 07/30/20  4:03 PM   Specimen: Nasopharyngeal(NP) swabs in vial transport medium  Result Value Ref Range Status   SARS-CoV-2, NAA Detected (A) Not Detected Final    Comment: Patients who have a positive COVID-19 test result may now have treatment options. Treatment options are available for patients with mild to moderate symptoms and for hospitalized patients. Visit our website at [email or web address] for resources and information. This nucleic acid amplification test was developed and its performance characteristics determined by [facility]. Nucleic acid amplification tests include RT-PCR and TMA. This test has not been FDA cleared or approved. This test has been authorized by FDA under an Emergency Use Authorization (EUA). This test is only authorized for the duration of time the declaration that circumstances exist justifying the authorization of the emergency use of in vitro diagnostic tests for detection of SARS-CoV-2 virus and/or diagnosis of COVID-19 infection under section 564(b)(1) of the Act, 21 U.S.C. 098JXB-1(Y360bbb-3(b) (1), unless the authorization is terminated or revoked sooner. When diagnostic testing is negativ e, the possibility of a false negative result should be considered in the context of a patient's recent exposures and the presence of clinical signs and symptoms consistent with COVID-19. An individual without symptoms of COVID-19 and who is not shedding SARS-CoV-2 virus would expect to have a negative (not detected) result in this assay.   SARS-COV-2, NAA 2 DAY TAT     Status: None   Collection Time: 07/30/20  4:03 PM  Result Value Ref Range Status   SARS-CoV-2, NAA 2 DAY TAT Performed  Final      Radiology Studies: CT ANGIO CHEST PE W OR WO CONTRAST  Addendum Date: 08/08/2020   ADDENDUM REPORT: 08/08/2020 07:23 ADDENDUM: Study discussed by telephone with Dr. Jaci CarrelHRISTOPHER  POLLINA on 08/08/2020 at 0718 hours. Electronically Signed   By: Odessa FlemingH  Hall M.D.   On: 08/08/2020 07:23   Result Date: 08/08/2020 CLINICAL DATA:  64 year old male positive COVID-19. Chronic aortic dissection with prior surgical repair of the ascending  aorta. Confusion and shortness of breath. EXAM: CT ANGIOGRAPHY CHEST WITH CONTRAST TECHNIQUE: Multidetector CT imaging of the chest was performed using the standard protocol during bolus administration of intravenous contrast. Multiplanar CT image reconstructions and MIPs were obtained to evaluate the vascular anatomy. CONTRAST:  OMNIPAQUE IOHEXOL 350 MG/ML SOLN COMPARISON:  Portable chest earlier today.  Chest CTA 06/24/2018. FINDINGS: Cardiovascular: Good contrast bolus timing in the pulmonary arterial tree. Respiratory motion artifact. No central or hilar pulmonary embolus. Subsegmental branches are obscured by motion. Appearance suspicious for distal branch pulmonary embolus in the lingula on series 6, image 141. But no other convincing pulmonary arterial branch occlusion. 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 Smaller true lumen and larger false lumen as before. Mild cardiomegaly has increased since 2019. No pericardial effusion. Calcified coronary artery atherosclerosis. Mediastinum/Nodes: Small reactive appearing mediastinal lymph nodes up to 10 mm short axis. Lungs/Pleura: Extensive bilateral peribronchial, peripheral, and confluent pulmonary ground-glass opacity typical for COVID-19. Pneumonia. Major airways remain patent. No pleural effusion or pneumothorax. Upper Abdomen: Stable visible upper abdominal viscera including small benign appearing liver hypodensities and exophytic left upper pole renal cyst. Musculoskeletal: Prior sternotomy. No acute osseous abnormality identified. Review of the MIP images confirms the above findings. IMPRESSION: 1. Extensive bilateral COVID-19 Pneumonia with a small volume of Acute  Pulmonary Embolus in the lingula. No central or hilar PE. 2. Reactive mediastinal lymph nodes. 3. Chronic Aortic Dissection is stable since 2019. Mild cardiomegaly but no pericardial effusion. Electronically Signed: By: Odessa Fleming M.D. On: 08/08/2020 07:14   DG Chest Port 1 View  Result Date: 08/08/2020 CLINICAL DATA:  Dyspnea EXAM: PORTABLE CHEST 1 VIEW COMPARISON:  07/11/2010 FINDINGS: The lungs are symmetrically well expanded. There are extensive bilateral asymmetric pulmonary infiltrates, in keeping with atypical infection in the appropriate clinical setting. No pneumothorax or pleural effusion. Cardiac size within normal limits. Median sternotomy has been performed. No acute bone abnormality. IMPRESSION: Extensive bilateral asymmetric pulmonary infiltrates most in keeping with atypical infection in the appropriate clinical setting. Electronically Signed   By: Helyn Numbers MD   On: 08/08/2020 03:56       LOS: 1 day   Osvaldo Shipper  [facility] Pager on [email or web address]  08/09/2020, 12:41 PM

## 2020-08-09 NOTE — Progress Notes (Signed)
ANTICOAGULATION CONSULT NOTE -    Pharmacy Consult for heparin Indication: pulmonary embolus  Allergies  Allergen Reactions  . Codeine Nausea And Vomiting  . Quinolones     Patient was warned about not using Cipro and similar antibiotics. Recent studies have raised concern that fluoroquinolone antibiotics could be associated with an increased risk of aortic aneurysm Fluoroquinolones have non-antimicrobial properties that might jeopardise the integrity of the extracellular matrix of the vascular wall In a  propensity score matched cohort study in [place], there was a 66% increased rate of aortic aneurysm or dissection associated with oral fluoroquinolone use, compared wit    Patient Measurements: Height: 5\' 9"  (175.3 cm) Weight: 89.8 kg (198 lb) IBW/kg (Calculated) : 70.7 Heparin Dosing Weight: 89 kg  Vital Signs: Temp: 97.6 F (36.4 C) (12/29 2258) Temp Source: Oral (12/29 2258) BP: 122/75 (12/29 2258) Pulse Rate: 84 (12/29 2258)  Labs: Recent Labs    08/08/20 0332 08/08/20 1418 08/08/20 2217 08/09/20 0356  HGB 13.7  --   --  13.2  HCT 42.2  --   --  40.7  PLT 216  --   --  245  HEPARINUNFRC  --  0.23* 0.46 0.54  CREATININE 1.45*  --   --  1.48*    Estimated Creatinine Clearance: 55.1 mL/min (A) (by C-G formula based on SCr of 1.48 mg/dL (H)).   Medical History: Past Medical History:  Diagnosis Date  . 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 Decreased left ventricular function June 10, 2010   EF of 40-45% w/ out regional wall motion abnormalities 2011  EF 60%echo 2014  . Hypertension   . Nephrolithiasis    has history; status post stone removal  . Tobacco abuse     Medications:  Medications Prior to Admission  Medication Sig Dispense Refill Last Dose  . aspirin 81 MG tablet Take 1 tablet (81 mg total) by mouth daily.   08/07/2020 at Unknown time  . lisinopril  (ZESTRIL) 10 MG tablet Take 1 tablet (10 mg total) by mouth daily. 90 tablet 3 08/07/2020 at Unknown time  . amoxicillin-clavulanate (AUGMENTIN) 875-125 MG tablet Take 1 tablet by mouth 2 (two) times daily. 20 tablet 0   . fluticasone (FLONASE) 50 MCG/ACT nasal spray Place 1 spray into both nostrils 2 (two) times daily as needed for allergies or rhinitis. 16 g 6   . metoprolol tartrate (LOPRESSOR) 100 MG tablet Take 1 tablet (100 mg total) by mouth 2 (two) times daily. In addition to 25mg  twice daily 180 tablet 3   . metoprolol tartrate (LOPRESSOR) 25 MG tablet Take 1 tablet (25 mg total) by mouth 2 (two) times daily. Takes in addition to the 100mg  twice a day. 180 tablet 3   . predniSONE (DELTASONE) 20 MG tablet 2 po at same time daily for 5 days (Patient taking differently: Take 40 mg by mouth daily at 12 noon. Same time daily for 5 days) 10 tablet 0   . simvastatin (ZOCOR) 20 MG tablet Take 1 tablet (20 mg total) by mouth at bedtime. 90 tablet 3     Assessment: Pharmacy consulted to dose heparin in patient with PE. Patient with D-Dimer > 20.  CT angio positive for pulmonary embolus.  Today, 08/09/20  HL is 0.54, therapeutic  CBC WNL  SCr 1.48, CrCl 55.1 ml/min  No line or bleeding issues noted  Goal of Therapy:  Heparin  level 0.3-0.7 units/ml Monitor platelets by anticoagulation protocol: Yes   Plan:   continue heparin drip at 1650 units/hr  Daily CBC while on heparin  Monitor for signs and symptoms of bleeding   Arley Phenix RPh 08/09/2020, 5:12 AM

## 2020-08-10 ENCOUNTER — Other Ambulatory Visit: Payer: Self-pay | Admitting: Cardiology

## 2020-08-10 DIAGNOSIS — J1282 Pneumonia due to coronavirus disease 2019: Secondary | ICD-10-CM | POA: Diagnosis not present

## 2020-08-10 DIAGNOSIS — U071 COVID-19: Secondary | ICD-10-CM | POA: Diagnosis not present

## 2020-08-10 LAB — COMPREHENSIVE METABOLIC PANEL
ALT: 68 U/L — ABNORMAL HIGH (ref 0–44)
AST: 40 U/L (ref 15–41)
Albumin: 2.5 g/dL — ABNORMAL LOW (ref 3.5–5.0)
Alkaline Phosphatase: 39 U/L (ref 38–126)
Anion gap: 8 (ref 5–15)
BUN: 39 mg/dL — ABNORMAL HIGH (ref 8–23)
CO2: 22 mmol/L (ref 22–32)
Calcium: 8.5 mg/dL — ABNORMAL LOW (ref 8.9–10.3)
Chloride: 104 mmol/L (ref 98–111)
Creatinine, Ser: 1.49 mg/dL — ABNORMAL HIGH (ref 0.61–1.24)
GFR, Estimated: 52 mL/min — ABNORMAL LOW (ref >60)
Glucose, Bld: 151 mg/dL — ABNORMAL HIGH (ref 70–99)
Potassium: 5 mmol/L (ref 3.5–5.1)
Sodium: 134 mmol/L — ABNORMAL LOW (ref 135–145)
Total Bilirubin: 0.7 mg/dL (ref 0.3–1.2)
Total Protein: 6.2 g/dL — ABNORMAL LOW (ref 6.5–8.1)

## 2020-08-10 LAB — CBC WITH DIFFERENTIAL/PLATELET
Abs Immature Granulocytes: 0.09 10*3/uL — ABNORMAL HIGH (ref 0.00–0.07)
Basophils Absolute: 0 10*3/uL (ref 0.0–0.1)
Basophils Relative: 0 %
Eosinophils Absolute: 0 10*3/uL (ref 0.0–0.5)
Eosinophils Relative: 0 %
HCT: 40.2 % (ref 39.0–52.0)
Hemoglobin: 13 g/dL (ref 13.0–17.0)
Immature Granulocytes: 1 %
Lymphocytes Relative: 10 %
Lymphs Abs: 1 10*3/uL (ref 0.7–4.0)
MCH: 29 pg (ref 26.0–34.0)
MCHC: 32.3 g/dL (ref 30.0–36.0)
MCV: 89.7 fL (ref 80.0–100.0)
Monocytes Absolute: 0.4 10*3/uL (ref 0.1–1.0)
Monocytes Relative: 4 %
Neutro Abs: 8.8 10*3/uL — ABNORMAL HIGH (ref 1.7–7.7)
Neutrophils Relative %: 85 %
Platelets: 274 10*3/uL (ref 150–400)
RBC: 4.48 MIL/uL (ref 4.22–5.81)
RDW: 12.8 % (ref 11.5–15.5)
WBC: 10.4 10*3/uL (ref 4.0–10.5)
nRBC: 0 % (ref 0.0–0.2)

## 2020-08-10 LAB — HEPARIN LEVEL (UNFRACTIONATED)
Heparin Unfractionated: 1.01 IU/mL — ABNORMAL HIGH (ref 0.30–0.70)
Heparin Unfractionated: 0.56 IU/mL (ref 0.30–0.70)
Heparin Unfractionated: 0.78 IU/mL — ABNORMAL HIGH (ref 0.30–0.70)

## 2020-08-10 LAB — C-REACTIVE PROTEIN: CRP: 5.2 mg/dL — ABNORMAL HIGH (ref <1.0)

## 2020-08-10 LAB — D-DIMER, QUANTITATIVE: D-Dimer, Quant: 10.18 ug/mL-FEU — ABNORMAL HIGH (ref 0.00–0.50)

## 2020-08-10 MED ORDER — SALINE SPRAY 0.65 % NA SOLN
1.0000 | NASAL | Status: DC | PRN
  Filled 2020-08-10: qty 44

## 2020-08-10 MED ORDER — HYDROCORTISONE 1 % EX CREA
TOPICAL_CREAM | Freq: Two times a day (BID) | CUTANEOUS | Status: DC
  Administered 2020-08-11 – 2020-08-14 (×3): 1 via TOPICAL
  Filled 2020-08-10 (×2): qty 28

## 2020-08-10 MED ORDER — HEPARIN (PORCINE) 25000 UT/250ML-% IV SOLN
1150.0000 [IU]/h | INTRAVENOUS | Status: AC
  Administered 2020-08-10 (×2): 1450 [IU]/h via INTRAVENOUS
  Administered 2020-08-11: 1150 [IU]/h via INTRAVENOUS
  Filled 2020-08-10 (×2): qty 250

## 2020-08-10 NOTE — Progress Notes (Signed)
PROGRESS NOTE    Nicholas Palmer  WUJ:[id number] DOB: 1956/03/02 DOA: 08/08/2020 PCP: Dettinger, Elige Radon, MD    Chief Complaint  Patient presents with  . Covid Positive    Brief Narrative:  64 year old male with past medical history of aortic dissection several years ago, hypertension, nephrolithiasis, hyperlipidemia who tested positive for COVID-19 about 9 days prior to admission came in with progressively worsening shortness of breath fatigue malaise body aches and confusion.  He is unvaccinated.  Patient underwent imaging studies.  PE was diagnosed.  He was also noted to have pneumonia due to COVID-19.  He was hospitalized for further management.  He was noted to be hypoxic.  Subjective: On 3 L of oxygen, denies chest pain, no fever, tolerating heparin drip  Assessment & Plan:   Principal Problem:   Pneumonia due to COVID-19 virus Active Problems:   Essential hypertension   Hypercholesterolemia   Transaminitis   Moderate protein malnutrition (HCC)   Acute respiratory failure with hypoxia (HCC)    Acute hypoxic respiratory failure, due to Covid infection and PE  COVID-19 positive test (U07.1, COVID-19) with Acute Pneumonia (J12.89, Other viral pneumonia) Continue IV steroid, remdesivir, baricitinib Monitor inflammatory marker  PE has been on heparin drip Hemodynamically stable, denies chest pain Plan to transition to Eliquis if patient continued to improve  Transaminitis likely due to Covid infection, improving, monitor  Hyperlipidemia Statin held due to transaminitis, plan to resume once liver function improves  Hypertension Continue home medication Lopressor, lisinopril  CKD 3 a Creatinine appear at baseline, continue monitor, renal dosing meds  DVT prophylaxis: On full anticoagulation   Code Status: Full Family Communication: Patient Disposition:   Status is: Inpatient  Dispo: The patient is from: Home              Anticipated d/c is to: Home               Anticipated d/c date is: To be determined                Objective: Vitals:   08/09/20 2138 08/09/20 2245 08/10/20 0524 08/10/20 1351  BP:  112/65 114/70 115/64  Pulse:  100 74 80  Resp:  (!) 24 18 (!) 21  Temp:  97.6 F (36.4 C) 97.7 F (36.5 C) 97.6 F (36.4 C)  TempSrc:  Oral Oral Oral  SpO2: 95% 93% 94% 94%  Weight:      Height:        Intake/Output Summary (Last 24 hours) at 08/10/2020 1852 Last data filed at 08/10/2020 1408 Gross per 24 hour  Intake 474 ml  Output 900 ml  Net -426 ml   Filed Weights   08/08/20 0244  Weight: 89.8 kg    Examination:  General exam: calm, NAD Respiratory system: Clear to auscultation. Respiratory effort normal. Cardiovascular system: S1 & S2 heard, RRR. No pedal edema. Gastrointestinal system: Abdomen is nondistended, soft and nontender. Normal bowel sounds heard. Central nervous system: Alert and oriented. No focal neurological deficits. Extremities: Symmetric 5 x 5 power. Skin: No rashes, lesions or ulcers Psychiatry: Judgement and insight appear normal. Mood & affect appropriate.     Data Reviewed: I have personally reviewed following labs and imaging studies  CBC: Recent Labs  Lab 08/08/20 0332 08/09/20 0356 08/10/20 0355  WBC 7.1 8.4 10.4  NEUTROABS 5.3 6.2 8.8*  HGB 13.7 13.2 13.0  HCT 42.2 40.7 40.2  MCV 90.2 89.6 89.7  PLT 216 245 274    Basic  Metabolic Panel: Recent Labs  Lab 08/08/20 0332 08/09/20 0356 08/10/20 0355  NA 133* 135 134*  K 4.1 4.7 5.0  CL 98 102 104  CO2 22 24 22   GLUCOSE 123* 106* 151*  BUN 18 26* 39*  CREATININE 1.45* 1.48* 1.49*  CALCIUM 8.2* 8.4* 8.5*    GFR: Estimated Creatinine Clearance: 54.7 mL/min (A) (by C-G formula based on SCr of 1.49 mg/dL (H)).  Liver Function Tests: Recent Labs  Lab 08/08/20 0332 08/09/20 0356 08/10/20 0355  AST 52* 51* 40  ALT 68* 69* 68*  ALKPHOS 44 43 39  BILITOT 1.1 0.6 0.7  PROT 6.7 6.5 6.2*  ALBUMIN 2.5* 2.5* 2.5*     CBG: No results for input(s): GLUCAP in the last 168 hours.   Recent Results (from the past 240 hour(s))  Blood Culture (routine x 2)     Status: None (Preliminary result)   Collection Time: 08/08/20  3:21 AM   Specimen: BLOOD  Result Value Ref Range Status   Specimen Description BLOOD LEFT ANTECUBITAL  Final   Special Requests   Final    BOTTLES DRAWN AEROBIC AND ANAEROBIC Blood Culture adequate volume   Culture   Final    NO GROWTH 2 DAYS Performed at [facility], [address], [place], [place]    Report Status PENDING  Incomplete  Blood Culture (routine x 2)     Status: None (Preliminary result)   Collection Time: 08/08/20  3:32 AM   Specimen: BLOOD LEFT HAND  Result Value Ref Range Status   Specimen Description BLOOD LEFT HAND  Final   Special Requests   Final    BOTTLES DRAWN AEROBIC AND ANAEROBIC Blood Culture adequate volume   Culture   Final    NO GROWTH 2 DAYS Performed at [facility], [address] [place], [place]    Report Status PENDING  Incomplete         Radiology Studies: No results found.      Scheduled Meds: . albuterol  2 puff Inhalation Q6H  . vitamin C  500 mg Oral Daily  . aspirin  81 mg Oral Daily  . baricitinib  2 mg Oral Daily  . famotidine  20 mg Oral Daily  . lisinopril  10 mg Oral Daily  . methylPREDNISolone (SOLU-MEDROL) injection  40 mg Intravenous Q12H  . metoprolol tartrate  100 mg Oral BID  . metoprolol tartrate  25 mg Oral BID  . sodium chloride flush  3 mL Intravenous Q12H  . zinc sulfate  220 mg Oral Daily   Continuous Infusions: . sodium chloride    . heparin 1,450 Units/hr (08/10/20 0727)  . remdesivir 100 mg in NS 100 mL 100 mg (08/10/20 1018)     LOS: 2 days   Time spent: 25 mins Greater than 50% of this time was spent in counseling, explanation of diagnosis, planning of further management, and coordination of care.  I have personally reviewed and interpreted on  08/10/2020 daily  labs, tele strips,I reviewed all nursing notes, pharmacy notes,  vitals, pertinent old records  I have discussed plan of care as described above with RN , patient on 08/10/2020  Voice Recognition /Dragon dictation system was used to create this note, attempts have been made to correct errors. Please contact the author with questions and/or clarifications.   08/12/2020, MD PhD FACP [facility]  Available via Epic secure chat 7am-7pm for nonurgent issues Please page for urgent issues To page the  attending provider between 7A-7P or the covering provider during after hours 7P-7A, please log into the web site [email or web address] and access using universal [place] password for that web site. If you do not have the password, please call the hospital operator.    08/10/2020, 6:52 PM

## 2020-08-10 NOTE — Progress Notes (Signed)
ANTICOAGULATION CONSULT NOTE -    Pharmacy Consult for heparin Indication: pulmonary embolus  Allergies  Allergen Reactions  . Codeine Nausea And Vomiting  . Quinolones     Patient was warned about not using Cipro and similar antibiotics. Recent studies have raised concern that fluoroquinolone antibiotics could be associated with an increased risk of aortic aneurysm Fluoroquinolones have non-antimicrobial properties that might jeopardise the integrity of the extracellular matrix of the vascular wall In a  propensity score matched cohort study in [place], there was a 66% increased rate of aortic aneurysm or dissection associated with oral fluoroquinolone use, compared wit    Patient Measurements: Height: 5\' 9"  (175.3 cm) Weight: 89.8 kg (198 lb) IBW/kg (Calculated) : 70.7 Heparin Dosing Weight: 89 kg  Vital Signs: Temp: 97.7 F (36.5 C) (12/31 0524) Temp Source: Oral (12/30 2245) BP: 114/70 (12/31 0524) Pulse Rate: 74 (12/31 0524)  Labs: Recent Labs    08/08/20 0332 08/08/20 1418 08/08/20 2217 08/09/20 0356 08/10/20 0355  HGB 13.7  --   --  13.2 13.0  HCT 42.2  --   --  40.7 40.2  PLT 216  --   --  245 274  HEPARINUNFRC  --    < > 0.46 0.54 1.01*  CREATININE 1.45*  --   --  1.48* 1.49*   < > = values in this interval not displayed.    Estimated Creatinine Clearance: 54.7 mL/min (A) (by C-G formula based on SCr of 1.49 mg/dL (H)).   Medical History: Past Medical History:  Diagnosis Date  . 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 Decreased left ventricular function June 10, 2010   EF of 40-45% w/ out regional wall motion abnormalities 2011  EF 60%echo 2014  . Hypertension   . Nephrolithiasis    has history; status post stone removal  . Tobacco abuse     Medications:  Medications Prior to Admission  Medication Sig Dispense Refill Last Dose  . aspirin 81 MG tablet Take 1  tablet (81 mg total) by mouth daily.   08/07/2020 at Unknown time  . Chlorpheniramine-Acetaminophen (CORICIDIN HBP COLD/FLU PO) Take 1 tablet by mouth as needed (congestion/flu symptoms).   Past Week at Unknown time  . lisinopril (ZESTRIL) 10 MG tablet Take 1 tablet (10 mg total) by mouth daily. 90 tablet 3 08/07/2020 at Unknown time  . metoprolol tartrate (LOPRESSOR) 100 MG tablet Take 1 tablet (100 mg total) by mouth 2 (two) times daily. In addition to 25mg  twice daily 180 tablet 3 08/07/2020  . metoprolol tartrate (LOPRESSOR) 25 MG tablet Take 1 tablet (25 mg total) by mouth 2 (two) times daily. Takes in addition to the 100mg  twice a day. 180 tablet 3 08/07/2020  . simvastatin (ZOCOR) 20 MG tablet Take 1 tablet (20 mg total) by mouth at bedtime. 90 tablet 3 08/07/2020  . amoxicillin-clavulanate (AUGMENTIN) 875-125 MG tablet Take 1 tablet by mouth 2 (two) times daily. 20 tablet 0   . fluticasone (FLONASE) 50 MCG/ACT nasal spray Place 1 spray into both nostrils 2 (two) times daily as needed for allergies or rhinitis. (Patient not taking: Reported on 08/09/2020) 16 g 6 Not Taking at Unknown time  . predniSONE (DELTASONE) 20 MG tablet 2 po at same time daily for 5 days (Patient taking differently: Take 40 mg by mouth daily at 12 noon. Same time daily for 5 days) 10 tablet 0  Assessment: Pharmacy consulted to dose heparin in patient with PE. Patient with D-Dimer > 20.  CT angio positive for pulmonary embolus.  Today, 08/10/20  HL 1.01 supra therapeutic  CBC WNL  SCr 1.49, CrCl 55.1 ml/min  Per RN no bleeding, but very bruised around IV site  Goal of Therapy:  Heparin level 0.3-0.7 units/ml Monitor platelets by anticoagulation protocol: Yes   Plan:   Hold heparin x 1 hour then resume at 1450 units/hr  Heparin level in 8 hours  Daily CBC while on heparin  Monitor for signs and symptoms of bleeding   Arley Phenix RPh 08/10/2020, 5:46 AM

## 2020-08-10 NOTE — Progress Notes (Signed)
ANTICOAGULATION CONSULT NOTE -    Pharmacy Consult for heparin Indication: pulmonary embolus  Allergies  Allergen Reactions  . Codeine Nausea And Vomiting  . Quinolones     Patient was warned about not using Cipro and similar antibiotics. Recent studies have raised concern that fluoroquinolone antibiotics could be associated with an increased risk of aortic aneurysm Fluoroquinolones have non-antimicrobial properties that might jeopardise the integrity of the extracellular matrix of the vascular wall In a  propensity score matched cohort study in [place], there was a 66% increased rate of aortic aneurysm or dissection associated with oral fluoroquinolone use, compared wit    Patient Measurements: Height: 5\' 9"  (175.3 cm) Weight: 89.8 kg (198 lb) IBW/kg (Calculated) : 70.7 Heparin Dosing Weight: 89 kg  Vital Signs: Temp: 97.6 F (36.4 C) (12/31 1351) Temp Source: Oral (12/31 1351) BP: 115/64 (12/31 1351) Pulse Rate: 80 (12/31 1351)  Labs: Recent Labs    08/08/20 0332 08/08/20 1418 08/09/20 0356 08/10/20 0355 08/10/20 1516  HGB 13.7  --  13.2 13.0  --   HCT 42.2  --  40.7 40.2  --   PLT 216  --  245 274  --   HEPARINUNFRC  --    < > 0.54 1.01* 0.56  CREATININE 1.45*  --  1.48* 1.49*  --    < > = values in this interval not displayed.    Estimated Creatinine Clearance: 54.7 mL/min (A) (by C-G formula based on SCr of 1.49 mg/dL (H)).   Medical History: Past Medical History:  Diagnosis Date  . 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 Decreased left ventricular function June 10, 2010   EF of 40-45% w/ out regional wall motion abnormalities 2011  EF 60%echo 2014  . Hypertension   . Nephrolithiasis    has history; status post stone removal  . Tobacco abuse     Medications:  Medications Prior to Admission  Medication Sig Dispense Refill Last Dose  . aspirin 81 MG tablet Take 1  tablet (81 mg total) by mouth daily.   08/07/2020 at Unknown time  . Chlorpheniramine-Acetaminophen (CORICIDIN HBP COLD/FLU PO) Take 1 tablet by mouth as needed (congestion/flu symptoms).   Past Week at Unknown time  . lisinopril (ZESTRIL) 10 MG tablet Take 1 tablet (10 mg total) by mouth daily. 90 tablet 3 08/07/2020 at Unknown time  . metoprolol tartrate (LOPRESSOR) 100 MG tablet Take 1 tablet (100 mg total) by mouth 2 (two) times daily. In addition to 25mg  twice daily 180 tablet 3 08/07/2020  . metoprolol tartrate (LOPRESSOR) 25 MG tablet Take 1 tablet (25 mg total) by mouth 2 (two) times daily. Takes in addition to the 100mg  twice a day. 180 tablet 3 08/07/2020  . simvastatin (ZOCOR) 20 MG tablet Take 1 tablet (20 mg total) by mouth at bedtime. 90 tablet 3 08/07/2020  . amoxicillin-clavulanate (AUGMENTIN) 875-125 MG tablet Take 1 tablet by mouth 2 (two) times daily. 20 tablet 0   . fluticasone (FLONASE) 50 MCG/ACT nasal spray Place 1 spray into both nostrils 2 (two) times daily as needed for allergies or rhinitis. (Patient not taking: Reported on 08/09/2020) 16 g 6 Not Taking at Unknown time  . predniSONE (DELTASONE) 20 MG tablet 2 po at same time daily for 5 days (Patient taking differently: Take 40 mg by mouth daily at 12 noon. Same time daily for 5 days) 10 tablet 0  Assessment: Pharmacy consulted to dose heparin in patient with PE. Patient with D-Dimer > 20.  CT angio positive for pulmonary embolus.  Today, 08/10/20  HL 0.56 therapeutic  CBC WNL  SCr 1.49, CrCl 55.1 ml/min  Per RN no bleeding,  No line issues   Goal of Therapy:  Heparin level 0.3-0.7 units/ml Monitor platelets by anticoagulation protocol: Yes   Plan:   Continue heparin at 1450 units/hr  Obtain confirmatory level of heparin   hours  Daily CBC while on heparin  Monitor for signs and symptoms of bleeding    Adalberto Cole, PharmD, BCPS 08/10/2020 4:39 PM

## 2020-08-10 NOTE — Progress Notes (Signed)
ANTICOAGULATION CONSULT NOTE -    Pharmacy Consult for heparin Indication: pulmonary embolus  Allergies  Allergen Reactions  . Codeine Nausea And Vomiting  . Quinolones     Patient was warned about not using Cipro and similar antibiotics. Recent studies have raised concern that fluoroquinolone antibiotics could be associated with an increased risk of aortic aneurysm Fluoroquinolones have non-antimicrobial properties that might jeopardise the integrity of the extracellular matrix of the vascular wall In a  propensity score matched cohort study in [place], there was a 66% increased rate of aortic aneurysm or dissection associated with oral fluoroquinolone use, compared wit    Patient Measurements: Height: 5\' 9"  (175.3 cm) Weight: 89.8 kg (198 lb) IBW/kg (Calculated) : 70.7 Heparin Dosing Weight: 89 kg  Vital Signs: Temp: 97.6 F (36.4 C) (12/31 1351) Temp Source: Oral (12/31 1351) BP: 115/64 (12/31 1351) Pulse Rate: 80 (12/31 1351)  Labs: Recent Labs    08/08/20 0332 08/08/20 1418 08/09/20 0356 08/10/20 0355 08/10/20 1516 08/10/20 2314  HGB 13.7  --  13.2 13.0  --   --   HCT 42.2  --  40.7 40.2  --   --   PLT 216  --  245 274  --   --   HEPARINUNFRC  --    < > 0.54 1.01* 0.56 0.78*  CREATININE 1.45*  --  1.48* 1.49*  --   --    < > = values in this interval not displayed.    Estimated Creatinine Clearance: 54.7 mL/min (A) (by C-G formula based on SCr of 1.49 mg/dL (H)).   Medical History: Past Medical History:  Diagnosis Date  . 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 Decreased left ventricular function June 10, 2010   EF of 40-45% w/ out regional wall motion abnormalities 2011  EF 60%echo 2014  . Hypertension   . Nephrolithiasis    has history; status post stone removal  . Tobacco abuse     Medications:  Medications Prior to Admission  Medication Sig Dispense Refill  Last Dose  . aspirin 81 MG tablet Take 1 tablet (81 mg total) by mouth daily.   08/07/2020 at Unknown time  . Chlorpheniramine-Acetaminophen (CORICIDIN HBP COLD/FLU PO) Take 1 tablet by mouth as needed (congestion/flu symptoms).   Past Week at Unknown time  . lisinopril (ZESTRIL) 10 MG tablet Take 1 tablet (10 mg total) by mouth daily. 90 tablet 3 08/07/2020 at Unknown time  . metoprolol tartrate (LOPRESSOR) 100 MG tablet Take 1 tablet (100 mg total) by mouth 2 (two) times daily. In addition to 25mg  twice daily 180 tablet 3 08/07/2020  . metoprolol tartrate (LOPRESSOR) 25 MG tablet Take 1 tablet (25 mg total) by mouth 2 (two) times daily. Takes in addition to the 100mg  twice a day. 180 tablet 3 08/07/2020  . simvastatin (ZOCOR) 20 MG tablet Take 1 tablet (20 mg total) by mouth at bedtime. 90 tablet 3 08/07/2020  . amoxicillin-clavulanate (AUGMENTIN) 875-125 MG tablet Take 1 tablet by mouth 2 (two) times daily. 20 tablet 0   . fluticasone (FLONASE) 50 MCG/ACT nasal spray Place 1 spray into both nostrils 2 (two) times daily as needed for allergies or rhinitis. (Patient not taking: Reported on 08/09/2020) 16 g 6 Not Taking at Unknown time  . predniSONE (DELTASONE) 20 MG tablet 2 po at same time daily for 5 days (Patient taking differently: Take 40 mg by mouth  daily at 12 noon. Same time daily for 5 days) 10 tablet 0     Assessment: Pharmacy consulted to dose heparin in patient with PE. Patient with D-Dimer > 20.  CT angio positive for pulmonary embolus.  Today, 08/10/20  2314 HL 0.78 supra-therapeutic  Per RN no bleeding,  No line issues   Goal of Therapy:  Heparin level 0.3-0.7 units/ml Monitor platelets by anticoagulation protocol: Yes   Plan:   Decrease heparin to 1350 units/hr  Obtain heparin level with am labs  Daily CBC while on heparin  Monitor for signs and symptoms of bleeding    Arley Phenix RPh 08/10/2020, 11:48 PM

## 2020-08-10 NOTE — TOC Progression Note (Signed)
Transition of Care [facility]) - Progression Note    Patient Details  Name: Nicholas Palmer MRN: [id number] Date of Birth: 01/15/1956  Transition of Care [facility]) CM/SW Contact  Coralyn Helling, [place] Phone Number: 08/10/2020, 9:41 AM  Clinical Narrative:     [facility] consulted for assistance with meds. Patient has Medicare and Medicaid. TOC unable to assist patient at this time due to available resources. Patient to follow up with insurance company or use [employer] for med assistance.        Expected Discharge Plan and Services                                                 Social Determinants of Health (SDOH) Interventions    Readmission Risk Interventions No flowsheet data found.

## 2020-08-11 DIAGNOSIS — U071 COVID-19: Secondary | ICD-10-CM | POA: Diagnosis not present

## 2020-08-11 DIAGNOSIS — J1282 Pneumonia due to coronavirus disease 2019: Secondary | ICD-10-CM | POA: Diagnosis not present

## 2020-08-11 LAB — HEPARIN LEVEL (UNFRACTIONATED)
Heparin Unfractionated: 0.58 IU/mL (ref 0.30–0.70)
Heparin Unfractionated: 0.8 IU/mL — ABNORMAL HIGH (ref 0.30–0.70)
Heparin Unfractionated: 0.48 IU/mL (ref 0.30–0.70)

## 2020-08-11 LAB — CBC WITH DIFFERENTIAL/PLATELET
Abs Immature Granulocytes: 0.1 10*3/uL — ABNORMAL HIGH (ref 0.00–0.07)
Basophils Absolute: 0 10*3/uL (ref 0.0–0.1)
Basophils Relative: 0 %
Eosinophils Absolute: 0 10*3/uL (ref 0.0–0.5)
Eosinophils Relative: 0 %
HCT: 40.7 % (ref 39.0–52.0)
Hemoglobin: 13.4 g/dL (ref 13.0–17.0)
Immature Granulocytes: 1 %
Lymphocytes Relative: 8 %
Lymphs Abs: 0.9 10*3/uL (ref 0.7–4.0)
MCH: 29.4 pg (ref 26.0–34.0)
MCHC: 32.9 g/dL (ref 30.0–36.0)
MCV: 89.3 fL (ref 80.0–100.0)
Monocytes Absolute: 0.4 10*3/uL (ref 0.1–1.0)
Monocytes Relative: 4 %
Neutro Abs: 10 10*3/uL — ABNORMAL HIGH (ref 1.7–7.7)
Neutrophils Relative %: 87 %
Platelets: 293 10*3/uL (ref 150–400)
RBC: 4.56 MIL/uL (ref 4.22–5.81)
RDW: 13.2 % (ref 11.5–15.5)
WBC: 11.5 10*3/uL — ABNORMAL HIGH (ref 4.0–10.5)
nRBC: 0 % (ref 0.0–0.2)

## 2020-08-11 LAB — COMPREHENSIVE METABOLIC PANEL
ALT: 92 U/L — ABNORMAL HIGH (ref 0–44)
AST: 67 U/L — ABNORMAL HIGH (ref 15–41)
Albumin: 2.6 g/dL — ABNORMAL LOW (ref 3.5–5.0)
Alkaline Phosphatase: 40 U/L (ref 38–126)
Anion gap: 9 (ref 5–15)
BUN: 42 mg/dL — ABNORMAL HIGH (ref 8–23)
CO2: 21 mmol/L — ABNORMAL LOW (ref 22–32)
Calcium: 8.8 mg/dL — ABNORMAL LOW (ref 8.9–10.3)
Chloride: 103 mmol/L (ref 98–111)
Creatinine, Ser: 1.45 mg/dL — ABNORMAL HIGH (ref 0.61–1.24)
GFR, Estimated: 53 mL/min — ABNORMAL LOW (ref >60)
Glucose, Bld: 139 mg/dL — ABNORMAL HIGH (ref 70–99)
Potassium: 5.3 mmol/L — ABNORMAL HIGH (ref 3.5–5.1)
Sodium: 133 mmol/L — ABNORMAL LOW (ref 135–145)
Total Bilirubin: 0.6 mg/dL (ref 0.3–1.2)
Total Protein: 6.4 g/dL — ABNORMAL LOW (ref 6.5–8.1)

## 2020-08-11 LAB — D-DIMER, QUANTITATIVE: D-Dimer, Quant: 7.81 ug/mL-FEU — ABNORMAL HIGH (ref 0.00–0.50)

## 2020-08-11 LAB — C-REACTIVE PROTEIN: CRP: 3.1 mg/dL — ABNORMAL HIGH (ref <1.0)

## 2020-08-11 MED ORDER — METHYLPREDNISOLONE SODIUM SUCC 40 MG IJ SOLR
40.0000 mg | Freq: Every day | INTRAMUSCULAR | Status: DC
  Administered 2020-08-12 – 2020-08-14 (×3): 40 mg via INTRAVENOUS
  Filled 2020-08-11 (×2): qty 1

## 2020-08-11 NOTE — Progress Notes (Signed)
ANTICOAGULATION CONSULT NOTE - Follow Up Consult  Pharmacy Consult for Heparin Indication: pulmonary embolus  Allergies  Allergen Reactions  . Codeine Nausea And Vomiting  . Quinolones     Patient was warned about not using Cipro and similar antibiotics. Recent studies have raised concern that fluoroquinolone antibiotics could be associated with an increased risk of aortic aneurysm Fluoroquinolones have non-antimicrobial properties that might jeopardise the integrity of the extracellular matrix of the vascular wall In a  propensity score matched cohort study in [place], there was a 66% increased rate of aortic aneurysm or dissection associated with oral fluoroquinolone use, compared wit    Patient Measurements: Height: 5\' 9"  (175.3 cm) Weight: 89.8 kg (198 lb) IBW/kg (Calculated) : 70.7 Heparin Dosing Weight: 89 kg  Vital Signs: Temp: 98.1 F (36.7 C) (01/01 1310) Temp Source: Oral (01/01 1310) BP: 99/57 (01/01 1310) Pulse Rate: 79 (01/01 1310)  Labs: Recent Labs    08/09/20 0356 08/10/20 0355 08/10/20 1516 08/10/20 2314 08/11/20 0438 08/11/20 1058  HGB 13.2 13.0  --   --  13.4  --   HCT 40.7 40.2  --   --  40.7  --   PLT 245 274  --   --  293  --   HEPARINUNFRC 0.54 1.01*   < > 0.78* 0.58 0.80*  CREATININE 1.48* 1.49*  --   --  1.45*  --    < > = values in this interval not displayed.    Estimated Creatinine Clearance: 56.3 mL/min (A) (by C-G formula based on SCr of 1.45 mg/dL (H)).   Medications:  Infusions:  . sodium chloride    . heparin 1,150 Units/hr (08/11/20 1222)  . remdesivir 100 mg in NS 100 mL 100 mg (08/11/20 1055)    Assessment: Pharmacy consulted to dose heparin in patient with PE. Patient with D-Dimer > 20.  CT angio positive for pulmonary embolus.  Re-check HL 0.48, therapeutic on 1150 units/hr Per RN no bleeding,  No line issues  Goal of Therapy:  Heparin level 0.3-0.7 units/ml Monitor platelets by anticoagulation protocol: Yes   Plan:   Continue heparin IV infusion at 1150 units/hr Confirmatory Heparin level in 6 hours - recheck with daily AM labs. Daily heparin level and CBC Follow up long-term anticoagulation plans.  10/09/20 PharmD, BCPS Clinical Pharmacist [facility] main pharmacy [phone] 08/11/2020 3:54 PM

## 2020-08-11 NOTE — Progress Notes (Signed)
ANTICOAGULATION CONSULT NOTE -    Pharmacy Consult for heparin Indication: pulmonary embolus  Allergies  Allergen Reactions  . Codeine Nausea And Vomiting  . Quinolones     Patient was warned about not using Cipro and similar antibiotics. Recent studies have raised concern that fluoroquinolone antibiotics could be associated with an increased risk of aortic aneurysm Fluoroquinolones have non-antimicrobial properties that might jeopardise the integrity of the extracellular matrix of the vascular wall In a  propensity score matched cohort study in [place], there was a 66% increased rate of aortic aneurysm or dissection associated with oral fluoroquinolone use, compared wit   Patient Measurements: Height: 5\' 9"  (175.3 cm) Weight: 89.8 kg (198 lb) IBW/kg (Calculated) : 70.7 Heparin Dosing Weight: 89 kg  Vital Signs: Temp: 97.6 F (36.4 C) (01/01 0619) Temp Source: Oral (12/31 2356) BP: 106/71 (01/01 0619) Pulse Rate: 74 (01/01 0619)  Labs: Recent Labs    08/09/20 0356 08/10/20 0355 08/10/20 1516 08/10/20 2314 08/11/20 0438  HGB 13.2 13.0  --   --  13.4  HCT 40.7 40.2  --   --  40.7  PLT 245 274  --   --  293  HEPARINUNFRC 0.54 1.01* 0.56 0.78* 0.58  CREATININE 1.48* 1.49*  --   --  1.45*   Estimated Creatinine Clearance: 56.3 mL/min (A) (by C-G formula based on SCr of 1.45 mg/dL (H)).  Medical History: Past Medical History:  Diagnosis Date  . 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 Decreased left ventricular function June 10, 2010   EF of 40-45% w/ out regional wall motion abnormalities 2011  EF 60%echo 2014  . Hypertension   . Nephrolithiasis    has history; status post stone removal  . Tobacco abuse     Medications:  Medications Prior to Admission  Medication Sig Dispense Refill Last Dose  . aspirin 81 MG tablet Take 1 tablet (81 mg total) by mouth daily.   08/07/2020 at  Unknown time  . Chlorpheniramine-Acetaminophen (CORICIDIN HBP COLD/FLU PO) Take 1 tablet by mouth as needed (congestion/flu symptoms).   Past Week at Unknown time  . lisinopril (ZESTRIL) 10 MG tablet Take 1 tablet (10 mg total) by mouth daily. 90 tablet 3 08/07/2020 at Unknown time  . metoprolol tartrate (LOPRESSOR) 100 MG tablet Take 1 tablet (100 mg total) by mouth 2 (two) times daily. In addition to 25mg  twice daily 180 tablet 3 08/07/2020  . metoprolol tartrate (LOPRESSOR) 25 MG tablet Take 1 tablet (25 mg total) by mouth 2 (two) times daily. Takes in addition to the 100mg  twice a day. 180 tablet 3 08/07/2020  . simvastatin (ZOCOR) 20 MG tablet Take 1 tablet (20 mg total) by mouth at bedtime. 90 tablet 3 08/07/2020  . amoxicillin-clavulanate (AUGMENTIN) 875-125 MG tablet Take 1 tablet by mouth 2 (two) times daily. 20 tablet 0   . fluticasone (FLONASE) 50 MCG/ACT nasal spray Place 1 spray into both nostrils 2 (two) times daily as needed for allergies or rhinitis. (Patient not taking: Reported on 08/09/2020) 16 g 6 Not Taking at Unknown time  . predniSONE (DELTASONE) 20 MG tablet 2 po at same time daily for 5 days (Patient taking differently: Take 40 mg by mouth daily at 12 noon. Same time daily for 5 days) 10 tablet 0     Assessment: Pharmacy consulted to dose heparin in patient with PE. Patient with D-Dimer > 20.  CT  angio positive for pulmonary embolus.  Today, 08/11/20  0438 HL 0.58, therapeutic on 1350 units/hr  CBC WNL  Per RN no bleeding,  No line issues   1100 HL 0.8, above therapeutic range again  Goal of Therapy:  Heparin level 0.3-0.7 units/ml Monitor platelets by anticoagulation protocol: Yes   Plan:   Decrease heparin drip to 1150 units/hr  Recheck 8 hr HL at 8pm  Daily Heparin level, CBC while on heparin  Monitor for signs and symptoms of bleeding  Otho Bellows PharmD [facility] [phone] 08/11/2020, 12:08 PM

## 2020-08-11 NOTE — Plan of Care (Signed)

## 2020-08-11 NOTE — Progress Notes (Signed)
PROGRESS NOTE    Nicholas Palmer  QZE:[id number] DOB: 02/24/1956 DOA: 08/08/2020 PCP: Dettinger, Elige Radon, MD    Chief Complaint  Patient presents with  . Covid Positive    Brief Narrative:  65 year old male with past medical history of aortic dissection several years ago, hypertension, nephrolithiasis, hyperlipidemia who tested positive for COVID-19 about 9 days prior to admission came in with progressively worsening shortness of breath fatigue malaise body aches and confusion.  He is unvaccinated.  Patient underwent imaging studies.  PE was diagnosed.  He was also noted to have pneumonia due to COVID-19.  He was hospitalized for further management.  He was noted to be hypoxic.  Subjective: On 3 L of oxygen, denies chest pain, no fever, cough has much improved  tolerating heparin drip Inflammatory marker improving, LFT fluctuating, potassium is little high today   Assessment & Plan:   Principal Problem:   Pneumonia due to COVID-19 virus Active Problems:   Essential hypertension   Hypercholesterolemia   Transaminitis   Moderate protein malnutrition (HCC)   Acute respiratory failure with hypoxia (HCC)    Acute hypoxic respiratory failure, due to Covid infection and PE -Overall improving, wean oxygen as tolerated, need home O2 eval prior to discharge  COVID-19 positive test (U07.1, COVID-19) with Acute Pneumonia (J12.89, Other viral pneumonia) Continue IV steroid, remdesivir, baricitinib  inflammatory marker improving, taper steroid from twice a day to daily  PE has been on heparin drip Hemodynamically stable, denies chest pain Plan to transition to Eliquis if patient continue to improve, likely tomorrow on January 2  Transaminitis likely due to Covid infection, overall improving, still fluctuating, monitor  Hyperlipidemia Statin held due to transaminitis, plan to resume once liver function improves  Hypertension Continue home medication Lopressor,   Plan to  discontinue lisinopril due to impaired renal function and tendency to have hyperkalemia  Hyperkalemia Reviewed chart he has tendency to have hyperkalemia even go back to 2016 k 5.3, discontinue lisinopril  CKD 3 a Creatinine appear at baseline, continue monitor, renal dosing meds Discontinue lisinopril  DVT prophylaxis: On full anticoagulation   Code Status: Full Family Communication: Patient, he lives by himself, he does not drive, needs transportation at discharge Disposition:   Status is: Inpatient  Dispo: The patient is from: Home              Anticipated d/c is to: Home, need transportation assistant at discharge              Anticipated d/c date is: 24 to 48 hours                Objective: Vitals:   08/10/20 1351 08/10/20 2130 08/10/20 2356 08/11/20 0619  BP: 115/64  106/67 106/71  Pulse: 80  76 74  Resp: (!) 21  20 20   Temp: 97.6 F (36.4 C)  97.7 F (36.5 C) 97.6 F (36.4 C)  TempSrc: Oral  Oral   SpO2: 94% (!) 88% 94% 95%  Weight:      Height:        Intake/Output Summary (Last 24 hours) at 08/11/2020 0826 Last data filed at 08/10/2020 2219 Gross per 24 hour  Intake 354 ml  Output 450 ml  Net -96 ml   Filed Weights   08/08/20 0244  Weight: 89.8 kg    Examination:  General exam: calm, NAD Respiratory system: Clear to auscultation. Respiratory effort normal. Cardiovascular system: S1 & S2 heard, RRR. No pedal edema. Gastrointestinal system: Abdomen is nondistended,  soft and nontender. Normal bowel sounds heard. Central nervous system: Alert and oriented. No focal neurological deficits. Extremities: Symmetric 5 x 5 power. Skin: No rashes, lesions or ulcers Psychiatry: Judgement and insight appear normal. Mood & affect appropriate.     Data Reviewed: I have personally reviewed following labs and imaging studies  CBC: Recent Labs  Lab 08/08/20 0332 08/09/20 0356 08/10/20 0355 08/11/20 0438  WBC 7.1 8.4 10.4 11.5*  NEUTROABS 5.3 6.2 8.8*  10.0*  HGB 13.7 13.2 13.0 13.4  HCT 42.2 40.7 40.2 40.7  MCV 90.2 89.6 89.7 89.3  PLT 216 245 274 654    Basic Metabolic Panel: Recent Labs  Lab 08/08/20 0332 08/09/20 0356 08/10/20 0355 08/11/20 0438  NA 133* 135 134* 133*  K 4.1 4.7 5.0 5.3*  CL 98 102 104 103  CO2 22 24 22  21*  GLUCOSE 123* 106* 151* 139*  BUN 18 26* 39* 42*  CREATININE 1.45* 1.48* 1.49* 1.45*  CALCIUM 8.2* 8.4* 8.5* 8.8*    GFR: Estimated Creatinine Clearance: 56.3 mL/min (A) (by C-G formula based on SCr of 1.45 mg/dL (H)).  Liver Function Tests: Recent Labs  Lab 08/08/20 0332 08/09/20 0356 08/10/20 0355 08/11/20 0438  AST 52* 51* 40 67*  ALT 68* 69* 68* 92*  ALKPHOS 44 43 39 40  BILITOT 1.1 0.6 0.7 0.6  PROT 6.7 6.5 6.2* 6.4*  ALBUMIN 2.5* 2.5* 2.5* 2.6*    CBG: No results for input(s): GLUCAP in the last 168 hours.   Recent Results (from the past 240 hour(s))  Blood Culture (routine x 2)     Status: None (Preliminary result)   Collection Time: 08/08/20  3:21 AM   Specimen: BLOOD  Result Value Ref Range Status   Specimen Description BLOOD LEFT ANTECUBITAL  Final   Special Requests   Final    BOTTLES DRAWN AEROBIC AND ANAEROBIC Blood Culture adequate volume   Culture   Final    NO GROWTH 3 DAYS Performed at [facility], [address], [place], [place] [address]    Report Status PENDING  Incomplete  Blood Culture (routine x 2)     Status: None (Preliminary result)   Collection Time: 08/08/20  3:32 AM   Specimen: BLOOD LEFT HAND  Result Value Ref Range Status   Specimen Description BLOOD LEFT HAND  Final   Special Requests   Final    BOTTLES DRAWN AEROBIC AND ANAEROBIC Blood Culture adequate volume   Culture   Final    NO GROWTH 3 DAYS Performed at [facility], [address], [place], [place] [address]    Report Status PENDING  Incomplete         Radiology Studies: No results found.      Scheduled Meds: . albuterol  2 puff Inhalation Q6H  . vitamin C  500 mg  Oral Daily  . aspirin  81 mg Oral Daily  . baricitinib  2 mg Oral Daily  . famotidine  20 mg Oral Daily  . hydrocortisone cream   Topical BID  . methylPREDNISolone (SOLU-MEDROL) injection  40 mg Intravenous Q12H  . metoprolol tartrate  100 mg Oral BID  . metoprolol tartrate  25 mg Oral BID  . sodium chloride flush  3 mL Intravenous Q12H  . zinc sulfate  220 mg Oral Daily   Continuous Infusions: . sodium chloride    . heparin 1,350 Units/hr (08/11/20 0044)  . remdesivir 100 mg in NS 100 mL 100 mg (08/10/20 1018)  LOS: 3 days   Time spent: 25 mins Greater than 50% of this time was spent in counseling, explanation of diagnosis, planning of further management, and coordination of care.  I have personally reviewed and interpreted on  08/11/2020 daily labs, tele strips,I reviewed all nursing notes, pharmacy notes,  vitals, pertinent old records  I have discussed plan of care as described above with RN , patient on 08/11/2020  Voice Recognition /Dragon dictation system was used to create this note, attempts have been made to correct errors. Please contact the author with questions and/or clarifications.   Albertine Grates, MD PhD FACP [facility]  Available via Epic secure chat 7am-7pm for nonurgent issues Please page for urgent issues To page the attending provider between 7A-7P or the covering provider during after hours 7P-7A, please log into the web site [email or web address] and access using universal [place] password for that web site. If you do not have the password, please call the hospital operator.    08/11/2020, 8:26 AM

## 2020-08-11 NOTE — Progress Notes (Signed)
ANTICOAGULATION CONSULT NOTE -    Pharmacy Consult for heparin Indication: pulmonary embolus  Allergies  Allergen Reactions  . Codeine Nausea And Vomiting  . Quinolones     Patient was warned about not using Cipro and similar antibiotics. Recent studies have raised concern that fluoroquinolone antibiotics could be associated with an increased risk of aortic aneurysm Fluoroquinolones have non-antimicrobial properties that might jeopardise the integrity of the extracellular matrix of the vascular wall In a  propensity score matched cohort study in [place], there was a 66% increased rate of aortic aneurysm or dissection associated with oral fluoroquinolone use, compared wit    Patient Measurements: Height: 5\' 9"  (175.3 cm) Weight: 89.8 kg (198 lb) IBW/kg (Calculated) : 70.7 Heparin Dosing Weight: 89 kg  Vital Signs: Temp: 97.7 F (36.5 C) (12/31 2356) Temp Source: Oral (12/31 2356) BP: 106/67 (12/31 2356) Pulse Rate: 76 (12/31 2356)  Labs: Recent Labs    08/09/20 0356 08/10/20 0355 08/10/20 1516 08/10/20 2314 08/11/20 0438  HGB 13.2 13.0  --   --  13.4  HCT 40.7 40.2  --   --  40.7  PLT 245 274  --   --  293  HEPARINUNFRC 0.54 1.01* 0.56 0.78* 0.58  CREATININE 1.48* 1.49*  --   --  1.45*    Estimated Creatinine Clearance: 56.3 mL/min (A) (by C-G formula based on SCr of 1.45 mg/dL (H)).   Medical History: Past Medical History:  Diagnosis Date  . Aortic dissection 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 Decreased left ventricular function June 10, 2010   EF of 40-45% w/ out regional wall motion abnormalities 2011  EF 60%echo 2014  . Hypertension   . Nephrolithiasis    has history; status post stone removal  . Tobacco abuse     Medications:  Medications Prior to Admission  Medication Sig Dispense Refill Last Dose  . aspirin 81 MG tablet Take 1 tablet (81 mg total) by mouth daily.    08/07/2020 at Unknown time  . Chlorpheniramine-Acetaminophen (CORICIDIN HBP COLD/FLU PO) Take 1 tablet by mouth as needed (congestion/flu symptoms).   Past Week at Unknown time  . lisinopril (ZESTRIL) 10 MG tablet Take 1 tablet (10 mg total) by mouth daily. 90 tablet 3 08/07/2020 at Unknown time  . metoprolol tartrate (LOPRESSOR) 100 MG tablet Take 1 tablet (100 mg total) by mouth 2 (two) times daily. In addition to 25mg  twice daily 180 tablet 3 08/07/2020  . metoprolol tartrate (LOPRESSOR) 25 MG tablet Take 1 tablet (25 mg total) by mouth 2 (two) times daily. Takes in addition to the 100mg  twice a day. 180 tablet 3 08/07/2020  . simvastatin (ZOCOR) 20 MG tablet Take 1 tablet (20 mg total) by mouth at bedtime. 90 tablet 3 08/07/2020  . amoxicillin-clavulanate (AUGMENTIN) 875-125 MG tablet Take 1 tablet by mouth 2 (two) times daily. 20 tablet 0   . fluticasone (FLONASE) 50 MCG/ACT nasal spray Place 1 spray into both nostrils 2 (two) times daily as needed for allergies or rhinitis. (Patient not taking: Reported on 08/09/2020) 16 g 6 Not Taking at Unknown time  . predniSONE (DELTASONE) 20 MG tablet 2 po at same time daily for 5 days (Patient taking differently: Take 40 mg by mouth daily at 12 noon. Same time daily for 5 days) 10 tablet 0     Assessment: Pharmacy consulted to dose heparin in patient with PE. Patient with D-Dimer >  20.  CT angio positive for pulmonary embolus.  Today, 08/11/20  HL 0.58, therapeutic on 1350 units/hr  CBC WNL  Per RN no bleeding,  No line issues   Goal of Therapy:  Heparin level 0.3-0.7 units/ml Monitor platelets by anticoagulation protocol: Yes   Plan:   Continue heparin drip at 1350 units/hr  Confirmatory level in 6 hours  Daily CBC while on heparin  Monitor for signs and symptoms of bleeding    Arley Phenix RPh 08/11/2020, 6:01 AM

## 2020-08-12 ENCOUNTER — Inpatient Hospital Stay (HOSPITAL_COMMUNITY): Payer: Medicare Other

## 2020-08-12 DIAGNOSIS — U071 COVID-19: Secondary | ICD-10-CM | POA: Diagnosis not present

## 2020-08-12 DIAGNOSIS — J1282 Pneumonia due to coronavirus disease 2019: Secondary | ICD-10-CM | POA: Diagnosis not present

## 2020-08-12 LAB — CBC WITH DIFFERENTIAL/PLATELET
Abs Immature Granulocytes: 0.24 10*3/uL — ABNORMAL HIGH (ref 0.00–0.07)
Basophils Absolute: 0 10*3/uL (ref 0.0–0.1)
Basophils Relative: 0 %
Eosinophils Absolute: 0 10*3/uL (ref 0.0–0.5)
Eosinophils Relative: 0 %
HCT: 41.6 % (ref 39.0–52.0)
Hemoglobin: 13.7 g/dL (ref 13.0–17.0)
Immature Granulocytes: 2 %
Lymphocytes Relative: 12 %
Lymphs Abs: 1.7 10*3/uL (ref 0.7–4.0)
MCH: 29.9 pg (ref 26.0–34.0)
MCHC: 32.9 g/dL (ref 30.0–36.0)
MCV: 90.8 fL (ref 80.0–100.0)
Monocytes Absolute: 1 10*3/uL (ref 0.1–1.0)
Monocytes Relative: 7 %
Neutro Abs: 10.9 10*3/uL — ABNORMAL HIGH (ref 1.7–7.7)
Neutrophils Relative %: 79 %
Platelets: 331 10*3/uL (ref 150–400)
RBC: 4.58 MIL/uL (ref 4.22–5.81)
RDW: 13.1 % (ref 11.5–15.5)
WBC: 13.8 10*3/uL — ABNORMAL HIGH (ref 4.0–10.5)
nRBC: 0 % (ref 0.0–0.2)

## 2020-08-12 LAB — COMPREHENSIVE METABOLIC PANEL
ALT: 74 U/L — ABNORMAL HIGH (ref 0–44)
AST: 28 U/L (ref 15–41)
Albumin: 2.7 g/dL — ABNORMAL LOW (ref 3.5–5.0)
Alkaline Phosphatase: 37 U/L — ABNORMAL LOW (ref 38–126)
Anion gap: 9 (ref 5–15)
BUN: 39 mg/dL — ABNORMAL HIGH (ref 8–23)
CO2: 22 mmol/L (ref 22–32)
Calcium: 8.9 mg/dL (ref 8.9–10.3)
Chloride: 105 mmol/L (ref 98–111)
Creatinine, Ser: 1.44 mg/dL — ABNORMAL HIGH (ref 0.61–1.24)
GFR, Estimated: 54 mL/min — ABNORMAL LOW (ref >60)
Glucose, Bld: 114 mg/dL — ABNORMAL HIGH (ref 70–99)
Potassium: 4.8 mmol/L (ref 3.5–5.1)
Sodium: 136 mmol/L (ref 135–145)
Total Bilirubin: 0.5 mg/dL (ref 0.3–1.2)
Total Protein: 6.4 g/dL — ABNORMAL LOW (ref 6.5–8.1)

## 2020-08-12 LAB — C-REACTIVE PROTEIN: CRP: 1.4 mg/dL — ABNORMAL HIGH (ref <1.0)

## 2020-08-12 LAB — D-DIMER, QUANTITATIVE: D-Dimer, Quant: 3.79 ug/mL-FEU — ABNORMAL HIGH (ref 0.00–0.50)

## 2020-08-12 LAB — HEPARIN LEVEL (UNFRACTIONATED): Heparin Unfractionated: 0.41 IU/mL (ref 0.30–0.70)

## 2020-08-12 MED ORDER — ALBUTEROL SULFATE HFA 108 (90 BASE) MCG/ACT IN AERS: 2.0000 | INHALATION_SPRAY | RESPIRATORY_TRACT | Status: DC | PRN

## 2020-08-12 MED ORDER — SIMETHICONE 80 MG PO CHEW
80.0000 mg | CHEWABLE_TABLET | Freq: Once | ORAL | Status: AC
  Administered 2020-08-12: 80 mg via ORAL
  Filled 2020-08-12: qty 1

## 2020-08-12 MED ORDER — APIXABAN 5 MG PO TABS
10.0000 mg | ORAL_TABLET | Freq: Two times a day (BID) | ORAL | Status: DC
  Administered 2020-08-12 – 2020-08-13 (×4): 10 mg via ORAL
  Filled 2020-08-12 (×4): qty 2

## 2020-08-12 MED ORDER — APIXABAN 5 MG PO TABS: 5.0000 mg | ORAL_TABLET | Freq: Two times a day (BID) | ORAL | Status: DC

## 2020-08-12 MED ORDER — ALUM & MAG HYDROXIDE-SIMETH 200-200-20 MG/5ML PO SUSP: 30.0000 mL | Freq: Four times a day (QID) | ORAL | Status: DC | PRN

## 2020-08-12 NOTE — Progress Notes (Signed)
ANTICOAGULATION CONSULT NOTE  Pharmacy Consult for Apixaban Indication: pulmonary embolus  Allergies  Allergen Reactions  . Codeine Nausea And Vomiting  . Quinolones     Patient was warned about not using Cipro and similar antibiotics. Recent studies have raised concern that fluoroquinolone antibiotics could be associated with an increased risk of aortic aneurysm Fluoroquinolones have non-antimicrobial properties that might jeopardise the integrity of the extracellular matrix of the vascular wall In a  propensity score matched cohort study in [place], there was a 66% increased rate of aortic aneurysm or dissection associated with oral fluoroquinolone use, compared wit   Patient Measurements: Height: 5\' 9"  (175.3 cm) Weight: 89.8 kg (198 lb) IBW/kg (Calculated) : 70.7 Heparin Dosing Weight: 89 kg  Vital Signs: Temp: 97.9 F (36.6 C) (01/02 0411) Temp Source: Oral (01/01 2103) BP: 128/70 (01/02 0411) Pulse Rate: 68 (01/02 0411)  Labs: Recent Labs    08/10/20 0355 08/10/20 1516 08/11/20 0438 08/11/20 1058 08/11/20 2029 08/12/20 0348  HGB 13.0  --  13.4  --   --  13.7  HCT 40.2  --  40.7  --   --  41.6  PLT 274  --  293  --   --  331  HEPARINUNFRC 1.01*   < > 0.58 0.80* 0.48 0.41  CREATININE 1.49*  --  1.45*  --   --  1.44*   < > = values in this interval not displayed.   Estimated Creatinine Clearance: 56.6 mL/min (A) (by C-G formula based on SCr of 1.44 mg/dL (H)).  Medications:  Infusions:  . sodium chloride    . heparin 1,150 Units/hr (08/11/20 1820)  . remdesivir 100 mg in NS 100 mL 100 mg (08/11/20 1055)   Assessment: Pharmacy consulted to dose heparin in patient with PE. Patient with D-Dimer > 20.  CT angio positive for pulmonary embolus.  08/12/2020 HL 0.41, therapeutic on 1150 units/hr CBC WNL, DDimer 3.79 no bleeding,  No line issues noted Change anti-coagulation to Apixaban SCr sl elevated, stable ~ 1.5, Cl sl < 60 ml/min  Goal of Therapy:  Monitor  platelets by anticoagulation protocol: Yes   Plan:  Anti-coagulation to Apixaban Discontinue Heparin at 1000, with first dose Apixaban Apixaban 10mg  bid x 7 days, then 5mg  bid Cannot educate patient face to face, will need to phone patient  10/10/2020 PharmD [facility] 08/12/2020, 7:57 AM

## 2020-08-12 NOTE — Progress Notes (Signed)
ANTICOAGULATION CONSULT NOTE - Follow Up Consult  Pharmacy Consult for Heparin Indication: pulmonary embolus  Allergies  Allergen Reactions  . Codeine Nausea And Vomiting  . Quinolones     Patient was warned about not using Cipro and similar antibiotics. Recent studies have raised concern that fluoroquinolone antibiotics could be associated with an increased risk of aortic aneurysm Fluoroquinolones have non-antimicrobial properties that might jeopardise the integrity of the extracellular matrix of the vascular wall In a  propensity score matched cohort study in [place], there was a 66% increased rate of aortic aneurysm or dissection associated with oral fluoroquinolone use, compared wit    Patient Measurements: Height: 5\' 9"  (175.3 cm) Weight: 89.8 kg (198 lb) IBW/kg (Calculated) : 70.7 Heparin Dosing Weight: 89 kg  Vital Signs: Temp: 97.9 F (36.6 C) (01/02 0411) Temp Source: Oral (01/01 2103) BP: 128/70 (01/02 0411) Pulse Rate: 68 (01/02 0411)  Labs: Recent Labs    08/10/20 0355 08/10/20 1516 08/11/20 0438 08/11/20 1058 08/11/20 2029 08/12/20 0348  HGB 13.0  --  13.4  --   --  13.7  HCT 40.2  --  40.7  --   --  41.6  PLT 274  --  293  --   --  331  HEPARINUNFRC 1.01*   < > 0.58 0.80* 0.48 0.41  CREATININE 1.49*  --  1.45*  --   --  1.44*   < > = values in this interval not displayed.    Estimated Creatinine Clearance: 56.6 mL/min (A) (by C-G formula based on SCr of 1.44 mg/dL (H)).   Medications:  Infusions:  . sodium chloride    . heparin 1,150 Units/hr (08/11/20 1820)  . remdesivir 100 mg in NS 100 mL 100 mg (08/11/20 1055)    Assessment: Pharmacy consulted to dose heparin in patient with PE. Patient with D-Dimer > 20.  CT angio positive for pulmonary embolus.  08/12/2020 HL 0.41, therapeutic on 1150 units/hr CBC WNL DDimer 3.79 no bleeding,  No line issues noted  Goal of Therapy:  Heparin level 0.3-0.7 units/ml Monitor platelets by anticoagulation  protocol: Yes   Plan:  Continue heparin IV infusion at 1150 units/hr Daily heparin level and CBC Follow up long-term anticoagulation plans.  10/10/2020 RPh 08/12/2020, 5:23 AM

## 2020-08-12 NOTE — Progress Notes (Signed)
Alerted MD this morning concerning pt's lower left abdominal protruding mass. Slight pain noted to this area yesterday, but no protrusion. Pt is uncomfortable with this area this morning. MD has come to bedside to assess. Xray has been ordered.

## 2020-08-12 NOTE — Progress Notes (Signed)
PROGRESS NOTE  Nicholas Palmer  DOB: 17-Aug-1955  PCP: Nils Pyle, MD TMA:[id number]  DOA: 08/08/2020  LOS: 4 days   Chief Complaint  Patient presents with  . Covid Positive    Brief narrative: Nicholas Palmer is a 65 y.o. male with PMH significant for aortic dissection several years ago, hypertension, nephrolithiasis, hyperlipidemia, not vaccinated for COVID who tested positive for COVID-19 about 9 days prior to admission.  Patient presented to the ED on 12/29 with progressively worsening shortness of breath, fatigue, malaise body aches and confusion. CT angio of chest showed extensive bilateral COVID-19 pneumonia with a small amount of acute pulmonary edema in the lingula.  He was admitted for Covid 19 pneumonia.  Subjective: Patient was seen and examined this morning. Lying down in bed.  Not in distress.  On 3 L oxygen by nasal cannula. Patient is concerned about of palpable baseball size mass in his left lower quadrant today.  It is slightly tender.  He denies history of hernia.  He noticed it this morning only.  Assessment/Plan: COVID pneumonia Acute respiratory failure with hypoxia  -Presented with worsening Covid symptoms -COVID test: PCR positive -Chest imaging: CT chest with extensive bilateral COVID-19 pneumonia. -Treatment: Completed a course of IV remdesivir, also given baricitinib.  Currently on IV Solu-Medrol 40 mg daily. -Progression: Gradually improving but feels tired. -Continue the same regimen for today -On 2 to 3 L oxygen.  Wean down as tolerated. -Supportive care: Vitamin C, Zinc, PRN inhalers, Tylenol, Antitussives (benzonatate/ Mucinex/Tussionex).   -Encouraged incentive spirometry, prone position, out of bed and early mobilization as much as possible -Continue airborne/contact isolation precautions for duration of 3 weeks from the day of diagnosis. -WBC and inflammatory markers trend as below.  Overall improvement noted  Recent Labs  Lab  08/08/20 0332 08/09/20 0356 08/10/20 0355 08/11/20 0438 08/12/20 0348  WBC 7.1 8.4 10.4 11.5* 13.8*  LATICACIDVEN 1.6  --   --   --   --   PROCALCITON 0.19  --   --   --   --   DDIMER >20.00* >20.00* 10.18* 7.81* 3.79*  FERRITIN 1,192* 1,076*  --   --   --   LDH 341*  --   --   --   --   CRP 17.6* 10.5* 5.2* 3.1* 1.4*  ALT 68* 69* 68* 92* 74*   The treatment plan and use of medications and known side effects were discussed with patient/family. Some of the medications used are based on case reports/anecdotal data.  All other medications being used in the management of COVID-19 based on limited study data.  Complete risks and long-term side effects are unknown, however in the best clinical judgment they seem to be of some benefit.  Patient wanted to proceed with treatment options provided.  Acute pulmonary embolism -On heparin drip.  Switched to Eliquis this morning. -Low volume pulmonary embolism probably related to COVID-19.  3 to 6 months of anticoagulation planned.  Transaminitis  -likely due to Covid infection, overall improving Recent Labs  Lab 08/08/20 0332 08/09/20 0356 08/10/20 0355 08/11/20 0438 08/12/20 0348  AST 52* 51* 40 67* 28  ALT 68* 69* 68* 92* 74*  ALKPHOS 44 43 39 40 37*  BILITOT 1.1 0.6 0.7 0.6 0.5  PROT 6.7 6.5 6.2* 6.4* 6.4*  ALBUMIN 2.5* 2.5* 2.5* 2.6* 2.7*   Left lower quadrant mass -Slightly palpable tender mass.  X-ray abdomen normal.  Probably related to hernia -Continue to monitor clinically.  Bowel  regimen.  Obtain CT scan tomorrow versus today  Hyperlipidemia Okay to resume statin post discharge.  Hypertension -Blood pressure is currently controlled on blood pressure.  Continue home medication Lopressor,   Plan to discontinue lisinopril due to impaired renal function and tendency to have hyperkalemia  CKD 3 a -Creatinine appear at baseline -Okay to resume lisinopril. Recent Labs    08/08/20 0332 08/09/20 0356 08/10/20 0355  08/11/20 0438 08/12/20 0348  BUN 18 26* 39* 42* 39*  CREATININE 1.45* 1.48* 1.49* 1.45* 1.44*   Mobility: Encourage ambulation Code Status:   Code Status: Full Code  Nutritional status: Body mass index is 29.24 kg/m.     Diet Order            Diet Heart Room service appropriate? Yes; Fluid consistency: Thin  Diet effective now                 DVT prophylaxis:  apixaban (ELIQUIS) tablet 10 mg  apixaban (ELIQUIS) tablet 5 mg   Antimicrobials:  None Fluid: none Consultants: None Family Communication:  None at bedside  Status is: Inpatient  Remains inpatient appropriate because -ongoing treatment for Covid  Dispo: The patient is from: Home              Anticipated d/c is to: Home              Anticipated d/c date is: 1 to 2 days              Patient currently is not medically stable to d/c.       Infusions:  . sodium chloride      Scheduled Meds: . apixaban  10 mg Oral BID   Followed by  . [START ON 08/19/2020] apixaban  5 mg Oral BID  . vitamin C  500 mg Oral Daily  . aspirin  81 mg Oral Daily  . baricitinib  2 mg Oral Daily  . famotidine  20 mg Oral Daily  . hydrocortisone cream   Topical BID  . methylPREDNISolone (SOLU-MEDROL) injection  40 mg Intravenous Daily  . metoprolol tartrate  100 mg Oral BID  . metoprolol tartrate  25 mg Oral BID  . sodium chloride flush  3 mL Intravenous Q12H  . zinc sulfate  220 mg Oral Daily    Antimicrobials: Anti-infectives (From admission, onward)   Start     Dose/Rate Route Frequency Ordered Stop   08/09/20 1000  remdesivir 100 mg in sodium chloride 0.9 % 100 mL IVPB  Status:  Discontinued       "Followed by" Linked Group Details   100 mg 200 mL/hr over 30 Minutes Intravenous Daily 08/08/20 0512 08/08/20 0513   08/09/20 1000  remdesivir 100 mg in sodium chloride 0.9 % 100 mL IVPB       "Followed by" Linked Group Details   100 mg 200 mL/hr over 30 Minutes Intravenous Daily 08/08/20 0515 08/12/20 1046   08/08/20  0600  remdesivir 100 mg in sodium chloride 0.9 % 100 mL IVPB       "Followed by" Linked Group Details   100 mg 200 mL/hr over 30 Minutes Intravenous  Once 08/08/20 0515 08/08/20 0734   08/08/20 0515  remdesivir 200 mg in sodium chloride 0.9% 250 mL IVPB  Status:  Discontinued       "Followed by" Linked Group Details   200 mg 580 mL/hr over 30 Minutes Intravenous Once 08/08/20 0512 08/08/20 0513   08/08/20 0515  remdesivir 100 mg in  sodium chloride 0.9 % 100 mL IVPB       "Followed by" Linked Group Details   100 mg 200 mL/hr over 30 Minutes Intravenous  Once 08/08/20 0515 08/08/20 0913      PRN meds: sodium chloride, acetaminophen **OR** acetaminophen, albuterol, chlorpheniramine-HYDROcodone, guaiFENesin-dextromethorphan, prochlorperazine, sodium chloride, sodium chloride flush   Objective: Vitals:   08/11/20 2103 08/12/20 0411  BP: 113/67 128/70  Pulse: 88 68  Resp: 17 20  Temp: (!) 97.5 F (36.4 C) 97.9 F (36.6 C)  SpO2: 95% 98%    Intake/Output Summary (Last 24 hours) at 08/12/2020 1339 Last data filed at 08/12/2020 1256 Gross per 24 hour  Intake 1409.13 ml  Output 1025 ml  Net 384.13 ml   Filed Weights   08/08/20 0244  Weight: 89.8 kg   Weight change:  Body mass index is 29.24 kg/m.   Physical Exam: General exam: Pleasant, elderly Caucasian male.  Not in distress Skin: No rashes, lesions or ulcers. HEENT: Atraumatic, normocephalic, no obvious bleeding Lungs: Clear to auscultation bilaterally CVS: Regular rate and rhythm, no murmur GI/Abd soft, mild baseball size firmness in the right lower quadrant.  Bowel sound present CNS: Alert, awake, oriented x3 Psychiatry: Depressed look Extremities: No pedal edema, no calf tenderness  Data Review: I have personally reviewed the laboratory data and studies available.  Recent Labs  Lab 08/08/20 0332 08/09/20 0356 08/10/20 0355 08/11/20 0438 08/12/20 0348  WBC 7.1 8.4 10.4 11.5* 13.8*  NEUTROABS 5.3 6.2 8.8* 10.0*  10.9*  HGB 13.7 13.2 13.0 13.4 13.7  HCT 42.2 40.7 40.2 40.7 41.6  MCV 90.2 89.6 89.7 89.3 90.8  PLT 216 245 274 293 331   Recent Labs  Lab 08/08/20 0332 08/09/20 0356 08/10/20 0355 08/11/20 0438 08/12/20 0348  NA 133* 135 134* 133* 136  K 4.1 4.7 5.0 5.3* 4.8  CL 98 102 104 103 105  CO2 22 24 22  21* 22  GLUCOSE 123* 106* 151* 139* 114*  BUN 18 26* 39* 42* 39*  CREATININE 1.45* 1.48* 1.49* 1.45* 1.44*  CALCIUM 8.2* 8.4* 8.5* 8.8* 8.9    F/u labs ordered  Signed, , MD [facility] 08/12/2020

## 2020-08-13 ENCOUNTER — Inpatient Hospital Stay (HOSPITAL_COMMUNITY): Payer: Medicare Other

## 2020-08-13 DIAGNOSIS — U071 COVID-19: Secondary | ICD-10-CM | POA: Diagnosis not present

## 2020-08-13 DIAGNOSIS — J1282 Pneumonia due to coronavirus disease 2019: Secondary | ICD-10-CM | POA: Diagnosis not present

## 2020-08-13 LAB — COMPREHENSIVE METABOLIC PANEL
ALT: 65 U/L — ABNORMAL HIGH (ref 0–44)
AST: 24 U/L (ref 15–41)
Albumin: 2.9 g/dL — ABNORMAL LOW (ref 3.5–5.0)
Alkaline Phosphatase: 37 U/L — ABNORMAL LOW (ref 38–126)
Anion gap: 9 (ref 5–15)
BUN: 52 mg/dL — ABNORMAL HIGH (ref 8–23)
CO2: 20 mmol/L — ABNORMAL LOW (ref 22–32)
Calcium: 8.6 mg/dL — ABNORMAL LOW (ref 8.9–10.3)
Chloride: 104 mmol/L (ref 98–111)
Creatinine, Ser: 1.59 mg/dL — ABNORMAL HIGH (ref 0.61–1.24)
GFR, Estimated: 48 mL/min — ABNORMAL LOW (ref >60)
Glucose, Bld: 139 mg/dL — ABNORMAL HIGH (ref 70–99)
Potassium: 5.2 mmol/L — ABNORMAL HIGH (ref 3.5–5.1)
Sodium: 133 mmol/L — ABNORMAL LOW (ref 135–145)
Total Bilirubin: 0.7 mg/dL (ref 0.3–1.2)
Total Protein: 6.3 g/dL — ABNORMAL LOW (ref 6.5–8.1)

## 2020-08-13 LAB — CULTURE, BLOOD (ROUTINE X 2)
Culture: NO GROWTH
Culture: NO GROWTH
Special Requests: ADEQUATE
Special Requests: ADEQUATE

## 2020-08-13 LAB — CBC WITH DIFFERENTIAL/PLATELET
Abs Immature Granulocytes: 0.44 10*3/uL — ABNORMAL HIGH (ref 0.00–0.07)
Basophils Absolute: 0 10*3/uL (ref 0.0–0.1)
Basophils Relative: 0 %
Eosinophils Absolute: 0 10*3/uL (ref 0.0–0.5)
Eosinophils Relative: 0 %
HCT: 40.1 % (ref 39.0–52.0)
Hemoglobin: 12.9 g/dL — ABNORMAL LOW (ref 13.0–17.0)
Immature Granulocytes: 2 %
Lymphocytes Relative: 11 %
Lymphs Abs: 2 10*3/uL (ref 0.7–4.0)
MCH: 29.5 pg (ref 26.0–34.0)
MCHC: 32.2 g/dL (ref 30.0–36.0)
MCV: 91.6 fL (ref 80.0–100.0)
Monocytes Absolute: 1.7 10*3/uL — ABNORMAL HIGH (ref 0.1–1.0)
Monocytes Relative: 9 %
Neutro Abs: 15.2 10*3/uL — ABNORMAL HIGH (ref 1.7–7.7)
Neutrophils Relative %: 78 %
Platelets: 386 10*3/uL (ref 150–400)
RBC: 4.38 MIL/uL (ref 4.22–5.81)
RDW: 13.2 % (ref 11.5–15.5)
WBC: 19.4 10*3/uL — ABNORMAL HIGH (ref 4.0–10.5)
nRBC: 0 % (ref 0.0–0.2)

## 2020-08-13 LAB — C-REACTIVE PROTEIN: CRP: 0.9 mg/dL (ref <1.0)

## 2020-08-13 LAB — PHOSPHORUS: Phosphorus: 5.6 mg/dL — ABNORMAL HIGH (ref 2.5–4.6)

## 2020-08-13 LAB — MAGNESIUM: Magnesium: 2.4 mg/dL (ref 1.7–2.4)

## 2020-08-13 LAB — D-DIMER, QUANTITATIVE: D-Dimer, Quant: 3.81 ug/mL-FEU — ABNORMAL HIGH (ref 0.00–0.50)

## 2020-08-13 MED ORDER — BARIUM SULFATE 2.1 % PO SUSP
450.0000 mL | Freq: Two times a day (BID) | ORAL | Status: DC
  Administered 2020-08-13: 450 mL via ORAL

## 2020-08-13 MED ORDER — IOHEXOL 9 MG/ML PO SOLN
ORAL | Status: AC
  Filled 2020-08-13: qty 1000

## 2020-08-13 NOTE — Progress Notes (Signed)
   08/13/20 2239  Vitals  Temp 98.1 F (36.7 C)  Temp Source Oral  BP 99/69  MAP (mmHg) 79  BP Location Left Arm  BP Method Automatic  Patient Position (if appropriate) Sitting  Pulse Rate 95  Pulse Rate Source Monitor  Resp (!) 24  MEWS COLOR  MEWS Score Color Yellow  Oxygen Therapy  SpO2 98 %  O2 Device Nasal Cannula  O2 Flow Rate (L/min) 1 L/min  MEWS Score  MEWS Temp 0  MEWS Systolic 1  MEWS Pulse 0  MEWS RR 1  MEWS LOC 0  MEWS Score 2  On call [facility] notified ( Ouma,NP) as pt is due to get metoprolol 125mg .

## 2020-08-13 NOTE — Care Management Important Message (Signed)
Important Message  Patient Details IM Letter given to the Patient. Name: Nicholas Palmer MRN: [id number] Date of Birth: [birth year]   Medicare Important Message Given:  Yes     Caren Macadam 08/13/2020, 12:13 PM

## 2020-08-13 NOTE — Progress Notes (Signed)
PROGRESS NOTE  Nicholas Palmer  DOB: 1956/07/19  PCP: Nils Pyle, MD VEL:[id number]  DOA: 08/08/2020  LOS: 5 days   Chief Complaint  Patient presents with  . Covid Positive    Brief narrative: Nicholas Palmer is a 65 y.o. male with PMH significant for aortic dissection several years ago, hypertension, nephrolithiasis, hyperlipidemia, not vaccinated for COVID who tested positive for COVID-19 about 9 days prior to admission.  Patient presented to the ED on 12/29 with progressively worsening shortness of breath, fatigue, malaise body aches and confusion. CT angio of chest showed extensive bilateral COVID-19 pneumonia with a small amount of acute pulmonary edema in the lingula.  He was admitted for Covid 19 pneumonia.  Subjective: Patient was seen and examined this morning. Lying down in bed.  Not in distress.  On 3 L oxygen by nasal cannula. Continues to have mild pain in the left lower quadrant with a palpable firm mass. Pending CT scan of abdomen  Assessment/Plan: COVID pneumonia Acute respiratory failure with hypoxia  -Presented with worsening Covid symptoms -COVID test: PCR positive -Chest imaging: CT chest with extensive bilateral COVID-19 pneumonia. -Treatment: Completed a course of IV remdesivir, also given baricitinib.  Currently on IV Solu-Medrol 40 mg daily. -Progression: Gradually improving.  Less oxygen requirement today.  Continues to feel weak and tired. -On 1-2 L oxygen.  Wean down as tolerated.  Ambulate to check oxygen requirement -Supportive care: Vitamin C, Zinc, PRN inhalers, Tylenol, Antitussives (benzonatate/ Mucinex/Tussionex).   -Encouraged incentive spirometry, prone position, out of bed and early mobilization as much as possible -Continue airborne/contact isolation precautions for duration of 3 weeks from the day of diagnosis. -WBC and inflammatory markers trend as below.  Overall improvement noted  Recent Labs  Lab 08/08/20 0332 08/09/20 0356  08/10/20 0355 08/11/20 0438 08/12/20 0348 08/13/20 0427  WBC 7.1 8.4 10.4 11.5* 13.8* 19.4*  LATICACIDVEN 1.6  --   --   --   --   --   PROCALCITON 0.19  --   --   --   --   --   DDIMER >20.00* >20.00* 10.18* 7.81* 3.79* 3.81*  FERRITIN 1,192* 1,076*  --   --   --   --   LDH 341*  --   --   --   --   --   CRP 17.6* 10.5* 5.2* 3.1* 1.4* 0.9  ALT 68* 69* 68* 92* 74* 65*   The treatment plan and use of medications and known side effects were discussed with patient/family. Some of the medications used are based on case reports/anecdotal data.  All other medications being used in the management of COVID-19 based on limited study data.  Complete risks and long-term side effects are unknown, however in the best clinical judgment they seem to be of some benefit.  Patient wanted to proceed with treatment options provided.  Acute pulmonary embolism -On Eliquis -Low volume pulmonary embolism probably related to COVID-19.  3 to 6 months of anticoagulation planned.  Transaminitis  -likely due to Covid infection, overall improving Recent Labs  Lab 08/09/20 0356 08/10/20 0355 08/11/20 0438 08/12/20 0348 08/13/20 0427  AST 51* 40 67* 28 24  ALT 69* 68* 92* 74* 65*  ALKPHOS 43 39 40 37* 37*  BILITOT 0.6 0.7 0.6 0.5 0.7  PROT 6.5 6.2* 6.4* 6.4* 6.3*  ALBUMIN 2.5* 2.5* 2.6* 2.7* 2.9*   Left lower quadrant mass -Slightly palpable tender mass.  X-ray abdomen normal.  Probably related to hernia -Obtain  CT scan of abdomen today.  Hyperlipidemia -Okay to resume statin post discharge.  Hypertension -Blood pressure is currently controlled on blood pressure. Continue home medication Lopressor,   -Plan to discontinue lisinopril due to impaired renal function and tendency to have hyperkalemia  CKD 3 a -Creatinine appear at baseline -Lisinopril on hold. Recent Labs    08/08/20 0332 08/09/20 0356 08/10/20 0355 08/11/20 0438 08/12/20 0348 08/13/20 0427  BUN 18 26* 39* 42* 39* 52*   CREATININE 1.45* 1.48* 1.49* 1.45* 1.44* 1.59*   Mobility: Encourage ambulation Code Status:   Code Status: Full Code  Nutritional status: Body mass index is 29.24 kg/m.     Diet Order            Diet Heart Room service appropriate? Yes; Fluid consistency: Thin  Diet effective now                 DVT prophylaxis:  apixaban (ELIQUIS) tablet 10 mg  apixaban (ELIQUIS) tablet 5 mg   Antimicrobials:  None Fluid: none Consultants: None Family Communication:  None at bedside  Status is: Inpatient  Remains inpatient appropriate because -ongoing treatment for Covid and evaluation of abdominal mass  Dispo: The patient is from: Home              Anticipated d/c is to: Home              Anticipated d/c date is: 1 to 2 days              Patient currently is not medically stable to d/c.   Infusions:  . sodium chloride      Scheduled Meds: . apixaban  10 mg Oral BID   Followed by  . [START ON 08/19/2020] apixaban  5 mg Oral BID  . vitamin C  500 mg Oral Daily  . baricitinib  2 mg Oral Daily  . Barium Sulfate  450 mL Oral BID  . famotidine  20 mg Oral Daily  . hydrocortisone cream   Topical BID  . methylPREDNISolone (SOLU-MEDROL) injection  40 mg Intravenous Daily  . metoprolol tartrate  100 mg Oral BID  . metoprolol tartrate  25 mg Oral BID  . sodium chloride flush  3 mL Intravenous Q12H  . zinc sulfate  220 mg Oral Daily    Antimicrobials: Anti-infectives (From admission, onward)   Start     Dose/Rate Route Frequency Ordered Stop   08/09/20 1000  remdesivir 100 mg in sodium chloride 0.9 % 100 mL IVPB  Status:  Discontinued       "Followed by" Linked Group Details   100 mg 200 mL/hr over 30 Minutes Intravenous Daily 08/08/20 0512 08/08/20 0513   08/09/20 1000  remdesivir 100 mg in sodium chloride 0.9 % 100 mL IVPB       "Followed by" Linked Group Details   100 mg 200 mL/hr over 30 Minutes Intravenous Daily 08/08/20 0515 08/12/20 1046   08/08/20 0600  remdesivir 100  mg in sodium chloride 0.9 % 100 mL IVPB       "Followed by" Linked Group Details   100 mg 200 mL/hr over 30 Minutes Intravenous  Once 08/08/20 0515 08/08/20 0734   08/08/20 0515  remdesivir 200 mg in sodium chloride 0.9% 250 mL IVPB  Status:  Discontinued       "Followed by" Linked Group Details   200 mg 580 mL/hr over 30 Minutes Intravenous Once 08/08/20 0512 08/08/20 0513   08/08/20 0515  remdesivir 100 mg  in sodium chloride 0.9 % 100 mL IVPB       "Followed by" Linked Group Details   100 mg 200 mL/hr over 30 Minutes Intravenous  Once 08/08/20 0515 08/08/20 0913      PRN meds: sodium chloride, acetaminophen **OR** acetaminophen, albuterol, alum & mag hydroxide-simeth, chlorpheniramine-HYDROcodone, guaiFENesin-dextromethorphan, sodium chloride, sodium chloride flush   Objective: Vitals:   08/13/20 0412 08/13/20 1353  BP: 103/76 102/69  Pulse: 82 83  Resp: 18 18  Temp: 97.7 F (36.5 C) 97.7 F (36.5 C)  SpO2: 97% 96%    Intake/Output Summary (Last 24 hours) at 08/13/2020 1455 Last data filed at 08/13/2020 0800 Gross per 24 hour  Intake --  Output 200 ml  Net -200 ml   Filed Weights   08/08/20 0244  Weight: 89.8 kg   Weight change:  Body mass index is 29.24 kg/m.   Physical Exam: General exam: Pleasant, elderly Caucasian male.  Not in distress Skin: No rashes, lesions or ulcers. HEENT: Atraumatic, normocephalic, no obvious bleeding Lungs: Clear to auscultation bilaterally CVS: Regular rate and rhythm, no murmur GI/Abd soft, mild baseball size firmness in the right lower quadrant.  Bowel sound present CNS: Alert, awake, oriented x3 Psychiatry: Depressed look Extremities: No pedal edema, no calf tenderness  Data Review: I have personally reviewed the laboratory data and studies available.  Recent Labs  Lab 08/09/20 0356 08/10/20 0355 08/11/20 0438 08/12/20 0348 08/13/20 0427  WBC 8.4 10.4 11.5* 13.8* 19.4*  NEUTROABS 6.2 8.8* 10.0* 10.9* 15.2*  HGB 13.2  13.0 13.4 13.7 12.9*  HCT 40.7 40.2 40.7 41.6 40.1  MCV 89.6 89.7 89.3 90.8 91.6  PLT 245 274 293 331 386   Recent Labs  Lab 08/09/20 0356 08/10/20 0355 08/11/20 0438 08/12/20 0348 08/13/20 0427  NA 135 134* 133* 136 133*  K 4.7 5.0 5.3* 4.8 5.2*  CL 102 104 103 105 104  CO2 24 22 21* 22 20*  GLUCOSE 106* 151* 139* 114* 139*  BUN 26* 39* 42* 39* 52*  CREATININE 1.48* 1.49* 1.45* 1.44* 1.59*  CALCIUM 8.4* 8.5* 8.8* 8.9 8.6*  MG  --   --   --   --  2.4  PHOS  --   --   --   --  5.6*    F/u labs ordered  Signed, Lorin Glass, MD [facility] 08/13/2020

## 2020-08-14 ENCOUNTER — Inpatient Hospital Stay (HOSPITAL_COMMUNITY): Payer: Medicare Other

## 2020-08-14 DIAGNOSIS — R7989 Other specified abnormal findings of blood chemistry: Secondary | ICD-10-CM

## 2020-08-14 DIAGNOSIS — J1282 Pneumonia due to coronavirus disease 2019: Secondary | ICD-10-CM | POA: Diagnosis not present

## 2020-08-14 DIAGNOSIS — U071 COVID-19: Secondary | ICD-10-CM

## 2020-08-14 LAB — CBC WITH DIFFERENTIAL/PLATELET
Abs Immature Granulocytes: 0.5 10*3/uL — ABNORMAL HIGH (ref 0.00–0.07)
Basophils Absolute: 0 10*3/uL (ref 0.0–0.1)
Basophils Relative: 0 %
Eosinophils Absolute: 0 10*3/uL (ref 0.0–0.5)
Eosinophils Relative: 0 %
HCT: 36.7 % — ABNORMAL LOW (ref 39.0–52.0)
Hemoglobin: 12.3 g/dL — ABNORMAL LOW (ref 13.0–17.0)
Immature Granulocytes: 3 %
Lymphocytes Relative: 16 %
Lymphs Abs: 2.4 10*3/uL (ref 0.7–4.0)
MCH: 29.8 pg (ref 26.0–34.0)
MCHC: 33.5 g/dL (ref 30.0–36.0)
MCV: 88.9 fL (ref 80.0–100.0)
Monocytes Absolute: 1.6 10*3/uL — ABNORMAL HIGH (ref 0.1–1.0)
Monocytes Relative: 11 %
Neutro Abs: 10.6 10*3/uL — ABNORMAL HIGH (ref 1.7–7.7)
Neutrophils Relative %: 70 %
Platelets: 282 10*3/uL (ref 150–400)
RBC: 4.13 MIL/uL — ABNORMAL LOW (ref 4.22–5.81)
RDW: 13.2 % (ref 11.5–15.5)
WBC: 15.2 10*3/uL — ABNORMAL HIGH (ref 4.0–10.5)
nRBC: 0 % (ref 0.0–0.2)

## 2020-08-14 LAB — BASIC METABOLIC PANEL
Anion gap: 13 (ref 5–15)
BUN: 46 mg/dL — ABNORMAL HIGH (ref 8–23)
CO2: 17 mmol/L — ABNORMAL LOW (ref 22–32)
Calcium: 8.6 mg/dL — ABNORMAL LOW (ref 8.9–10.3)
Chloride: 99 mmol/L (ref 98–111)
Creatinine, Ser: 1.39 mg/dL — ABNORMAL HIGH (ref 0.61–1.24)
GFR, Estimated: 56 mL/min — ABNORMAL LOW (ref >60)
Glucose, Bld: 115 mg/dL — ABNORMAL HIGH (ref 70–99)
Potassium: 5.2 mmol/L — ABNORMAL HIGH (ref 3.5–5.1)
Sodium: 129 mmol/L — ABNORMAL LOW (ref 135–145)

## 2020-08-14 LAB — PHOSPHORUS: Phosphorus: 4.1 mg/dL (ref 2.5–4.6)

## 2020-08-14 LAB — MAGNESIUM: Magnesium: 2.3 mg/dL (ref 1.7–2.4)

## 2020-08-14 MED ORDER — PREDNISONE 20 MG PO TABS
40.0000 mg | ORAL_TABLET | Freq: Every day | ORAL | Status: DC
  Administered 2020-08-15: 40 mg via ORAL
  Filled 2020-08-14: qty 2

## 2020-08-14 NOTE — Progress Notes (Signed)
PROGRESS NOTE  Nicholas Palmer  DOB: January 12, 1956  PCP: Nils Pyle, MD OHY:[id number]  DOA: 08/08/2020  LOS: 6 days   Chief Complaint  Patient presents with  . Covid Positive    Brief narrative: Nicholas Palmer is a 65 y.o. male with PMH significant for aortic dissection several years ago, hypertension, nephrolithiasis, hyperlipidemia, not vaccinated for COVID who tested positive for COVID-19 about 9 days prior to admission.  Patient presented to the ED on 12/29 with progressively worsening shortness of breath, fatigue, malaise body aches and confusion. CT angio of chest showed extensive bilateral COVID-19 pneumonia with a small amount of acute pulmonary embolism in the lingula.  He was admitted for Covid 19 pneumonia and pulmonary embolism.  His hospital course was complicated by hematoma formation in the rectus sheath muscle due to anticoagulation.  Subjective: Patient was seen and examined this morning.  Lying down in bed.  Feels weak.  Not been up from bed in 2 days.  Not on supplemental oxygen. Abdominal wall pain improving  Assessment/Plan: COVID pneumonia Acute respiratory failure with hypoxia  -Presented with worsening Covid symptoms -COVID test: PCR positive -Chest imaging: CT chest with extensive bilateral COVID-19 pneumonia. -Treatment: Completed a course of IV remdesivir, also given baricitinib. Currently on IV Solu-Medrol 40 mg daily.   -Progression: Improving satisfactory.  Not on supplemental oxygen today.  But continues to feel weak.  I would switch to oral prednisone.  Continue baricitinib for now.  -On 1-2 L oxygen.  Wean down as tolerated.  Ambulate to check oxygen requirement -Supportive care: Vitamin C, Zinc, PRN inhalers, Tylenol, Antitussives (benzonatate/ Mucinex/Tussionex).   -Encouraged incentive spirometry, prone position, out of bed and early mobilization as much as possible -Continue airborne/contact isolation precautions for duration of 3 weeks  from the day of diagnosis. -WBC and inflammatory markers trend as below.  Overall improvement noted  Recent Labs  Lab 08/08/20 0332 08/09/20 0356 08/10/20 0355 08/11/20 0438 08/12/20 0348 08/13/20 0427 08/14/20 0355  WBC 7.1 8.4 10.4 11.5* 13.8* 19.4* 15.2*  LATICACIDVEN 1.6  --   --   --   --   --   --   PROCALCITON 0.19  --   --   --   --   --   --   DDIMER >20.00* >20.00* 10.18* 7.81* 3.79* 3.81*  --   FERRITIN 1,192* 1,076*  --   --   --   --   --   LDH 341*  --   --   --   --   --   --   CRP 17.6* 10.5* 5.2* 3.1* 1.4* 0.9  --   ALT 68* 69* 68* 92* 74* 65*  --    The treatment plan and use of medications and known side effects were discussed with patient/family. Some of the medications used are based on case reports/anecdotal data.  All other medications being used in the management of COVID-19 based on limited study data.  Complete risks and long-term side effects are unknown, however in the best clinical judgment they seem to be of some benefit.  Patient wanted to proceed with treatment options provided.  Acute pulmonary embolism Intramuscular hematoma -CT angio on admission [place] small amount of acute pulmonary embolism in the lingula.  Patient was started on heparin drip and subsequently switched to Eliquis. -1/2, patient complained of tender firmness in left lower quadrant.  X-ray abdomen was unremarkable. -1/3, CT abdomen and pelvis was obtained which showed acute 5.5 x 11  x 12 cm left rectus sheath hematoma extending to the extraperitoneal and retroperitoneal spaces as a result on the left inguinal canal.  Eliquis was subsequently stopped.  Because of the fact that the home embolism was small in size, I would refrain from anticoagulation at this time. -Hemoglobin gradually dropping but is still remains more than 12.  Continue to monitor -Pain control with Tylenol as needed. Recent Labs    08/08/20 0332 08/09/20 0356 08/10/20 0355 08/11/20 0438 08/12/20 0348  08/13/20 0427 08/14/20 0355  HGB 13.7 13.2 13.0 13.4 13.7 12.9* 12.3*   Transaminitis  -likely due to Covid infection, overall improving Recent Labs  Lab 08/09/20 0356 08/10/20 0355 08/11/20 0438 08/12/20 0348 08/13/20 0427  AST 51* 40 67* 28 24  ALT 69* 68* 92* 74* 65*  ALKPHOS 43 39 40 37* 37*  BILITOT 0.6 0.7 0.6 0.5 0.7  PROT 6.5 6.2* 6.4* 6.4* 6.3*  ALBUMIN 2.5* 2.5* 2.6* 2.7* 2.9*   Hyperkalemia -Potassium is running elevated.  Unclear reason.  Not on supplemental potassium or potassium sparing diuretics.  Continue to monitor. Recent Labs  Lab 08/08/20 0332 08/09/20 0356 08/10/20 0355 08/11/20 0438 08/12/20 0348 08/13/20 0427 08/14/20 0355  K 4.1 4.7 5.0 5.3* 4.8 5.2* 5.2*   Hyponatremia -Sodium level down to 129 today.  Probably because of low solute intake.  Encourage oral appetite and hydration.  Continue to monitor. Recent Labs  Lab 08/08/20 0332 08/09/20 0356 08/10/20 0355 08/11/20 0438 08/12/20 0348 08/13/20 0427 08/14/20 0355  NA 133* 135 134* 133* 136 133* 129*   Hyperlipidemia -Resume statin.  Hypertension -Blood pressure is currently controlled on blood pressure. Continue home medication Lopressor,   -Plan to hold lisinopril due to impaired renal function and tendency to have hyperkalemia  CKD 3 a -Creatinine appear at baseline -Lisinopril on hold. Recent Labs    08/08/20 0332 08/09/20 0356 08/10/20 0355 08/11/20 0438 08/12/20 0348 08/13/20 0427 08/14/20 0355  BUN 18 26* 39* 42* 39* 52* 46*  CREATININE 1.45* 1.48* 1.49* 1.45* 1.44* 1.59* 1.39*   Mobility: Encourage ambulation.  PT reevaluation ordered. Code Status:   Code Status: Full Code  Nutritional status: Body mass index is 29.24 kg/m.     Diet Order            Diet Heart Room service appropriate? Yes; Fluid consistency: Thin  Diet effective now                 DVT prophylaxis: Anticoagulation is stopped.  SCDs ordered.  Antimicrobials:  None.   Fluid:  none Consultants: None Family Communication:  None at bedside  Status is: Inpatient  Remains inpatient appropriate because patient is weak, lives at home alone.  Repeat evaluation by PT ordered.   Dispo: The patient is from: Home              Anticipated d/c is to: Home versus rehab.              Anticipated d/c date is: 1 to 2 days              Patient currently is not medically stable to d/c.  Infusions:  . sodium chloride      Scheduled Meds: . vitamin C  500 mg Oral Daily  . baricitinib  2 mg Oral Daily  . famotidine  20 mg Oral Daily  . hydrocortisone cream   Topical BID  . metoprolol tartrate  100 mg Oral BID  . metoprolol tartrate  25 mg  Oral BID  . [START ON 08/15/2020] predniSONE  40 mg Oral Q breakfast  . sodium chloride flush  3 mL Intravenous Q12H  . zinc sulfate  220 mg Oral Daily    Antimicrobials: Anti-infectives (From admission, onward)   Start     Dose/Rate Route Frequency Ordered Stop   08/09/20 1000  remdesivir 100 mg in sodium chloride 0.9 % 100 mL IVPB  Status:  Discontinued       "Followed by" Linked Group Details   100 mg 200 mL/hr over 30 Minutes Intravenous Daily 08/08/20 0512 08/08/20 0513   08/09/20 1000  remdesivir 100 mg in sodium chloride 0.9 % 100 mL IVPB       "Followed by" Linked Group Details   100 mg 200 mL/hr over 30 Minutes Intravenous Daily 08/08/20 0515 08/12/20 1046   08/08/20 0600  remdesivir 100 mg in sodium chloride 0.9 % 100 mL IVPB       "Followed by" Linked Group Details   100 mg 200 mL/hr over 30 Minutes Intravenous  Once 08/08/20 0515 08/08/20 0734   08/08/20 0515  remdesivir 200 mg in sodium chloride 0.9% 250 mL IVPB  Status:  Discontinued       "Followed by" Linked Group Details   200 mg 580 mL/hr over 30 Minutes Intravenous Once 08/08/20 0512 08/08/20 0513   08/08/20 0515  remdesivir 100 mg in sodium chloride 0.9 % 100 mL IVPB       "Followed by" Linked Group Details   100 mg 200 mL/hr over 30 Minutes Intravenous  Once  08/08/20 0515 08/08/20 0913      PRN meds: sodium chloride, acetaminophen **OR** acetaminophen, albuterol, alum & mag hydroxide-simeth, chlorpheniramine-HYDROcodone, guaiFENesin-dextromethorphan, sodium chloride, sodium chloride flush   Objective: Vitals:   08/14/20 1035 08/14/20 1233  BP: 107/73 102/61  Pulse: 93 95  Resp: 18 18  Temp: 98.3 F (36.8 C) 98.3 F (36.8 C)  SpO2: 97% 95%    Intake/Output Summary (Last 24 hours) at 08/14/2020 1335 Last data filed at 08/14/2020 1100 Gross per 24 hour  Intake --  Output 800 ml  Net -800 ml   Filed Weights   08/08/20 0244  Weight: 89.8 kg   Weight change:  Body mass index is 29.24 kg/m.   Physical Exam: General exam: Pleasant, elderly Caucasian male.  Not in distress Skin: No rashes, lesions or ulcers. HEENT: Atraumatic, normocephalic, no obvious bleeding Lungs: Clear to auscultation bilaterally CVS: Regular rate and rhythm, no murmur GI/Abd soft, mild baseball size firmness in the right lower quadrant.  Bowel sound present CNS: Alert, awake, oriented x3 Psychiatry: Depressed look Extremities: No pedal edema, no calf tenderness  Data Review: I have personally reviewed the laboratory data and studies available.  Recent Labs  Lab 08/10/20 0355 08/11/20 0438 08/12/20 0348 08/13/20 0427 08/14/20 0355  WBC 10.4 11.5* 13.8* 19.4* 15.2*  NEUTROABS 8.8* 10.0* 10.9* 15.2* 10.6*  HGB 13.0 13.4 13.7 12.9* 12.3*  HCT 40.2 40.7 41.6 40.1 36.7*  MCV 89.7 89.3 90.8 91.6 88.9  PLT 274 293 331 386 282   Recent Labs  Lab 08/10/20 0355 08/11/20 0438 08/12/20 0348 08/13/20 0427 08/14/20 0355  NA 134* 133* 136 133* 129*  K 5.0 5.3* 4.8 5.2* 5.2*  CL 104 103 105 104 99  CO2 22 21* 22 20* 17*  GLUCOSE 151* 139* 114* 139* 115*  BUN 39* 42* 39* 52* 46*  CREATININE 1.49* 1.45* 1.44* 1.59* 1.39*  CALCIUM 8.5* 8.8* 8.9 8.6* 8.6*  MG  --   --   --  2.4 2.3  PHOS  --   --   --  5.6* 4.1    F/u labs ordered  Signed, Lorin Glass, MD [facility] 08/14/2020

## 2020-08-14 NOTE — Progress Notes (Signed)
YELLOW MEWS continues for HR & RR. Metoprolol 125mg  dose held at 2200, per consultation with E Ouma, NP.  HR has gone up from 88 at the beginning of shift to 100-110 currently. Updated Amion text page sent to [facility]

## 2020-08-14 NOTE — Progress Notes (Signed)
   08/13/20 2239  Assess: MEWS Score  Temp 98.1 F (36.7 C)  BP 99/69  Pulse Rate 95  Resp (!) 24  SpO2 98 %  O2 Device Nasal Cannula  O2 Flow Rate (L/min) 1 L/min  Assess: MEWS Score  MEWS Temp 0  MEWS Systolic 1  MEWS Pulse 0  MEWS RR 1  MEWS LOC 0  MEWS Score 2  MEWS Score Color Yellow  RR slightly elevated, given the IS to use ( he does not like to use this) educated on the importance of IS & flutter valve use

## 2020-08-14 NOTE — Progress Notes (Signed)
   08/14/20 0428  Assess: MEWS Score  Temp 98.2 F (36.8 C)  BP 104/64  Pulse Rate (!) 106  Resp (!) 22  SpO2 97 %  Assess: MEWS Score  MEWS Temp 0  MEWS Systolic 0  MEWS Pulse 1  MEWS RR 1  MEWS LOC 0  MEWS Score 2  MEWS Score Color Yellow  Assess: if the MEWS score is Yellow or Red  Were vital signs taken at a resting state? Yes  Focused Assessment No change from prior assessment  Early Detection of Sepsis Score *See Row Information* Low  MEWS guidelines implemented *See Row Information* No, previously yellow, continue vital signs every 4 hours  Treat  MEWS Interventions Other (Comment) (declined tylenol/ PRN meds)  Take Vital Signs  Increase Vital Sign Frequency  Yellow: Q 2hr X 2 then Q 4hr X 2, if remains yellow, continue Q 4hrs  Escalate  MEWS: Escalate Yellow: discuss with charge nurse/RN and consider discussing with provider and RRT  Document  Patient Outcome Other (Comment) (stable no intervention required , cont to monitor)

## 2020-08-14 NOTE — Evaluation (Signed)
Physical Therapy Evaluation Patient Details Name: Nicholas Palmer MRN: [id number] DOB: [birth year] Today's Date: 08/14/2020   History of Present Illness  65 y.o. male with PMH significant for aortic dissection several years ago, hypertension, nephrolithiasis, hyperlipidemia, not vaccinated for COVID who tested positive for COVID-19 about 9 days prior to admission.   Patient presented to the ED on 12/29 with progressively worsening shortness of breath, fatigue, malaise body aches and confusion.  CT angio of chest showed extensive bilateral COVID-19 pneumonia with a small amount of acute pulmonary embolism in the lingula.  Clinical Impression  Pt admitted with above diagnosis.  Pt currently with functional limitations due to the deficits listed below (see PT Problem List). Pt will benefit from skilled PT to increase their independence and safety with mobility to allow discharge to the venue listed below.  Pt ambulated short distance within room however fatigues quickly.  Pt only reports mild SOB.  Pt able to sit in recliner end of session and encouraged using incentive spirometer (already in room, pt performed 10 breaths).  Pt reports his ex-wife could possibly stay with him upon d/c however upon asking him however he would safely live with her, he became uncertain (in regards to him + for Covid, and she is not as far as he knows).  Recommend SNF upon d/c if pt unable to have assist at home.     Follow Up Recommendations SNF    Equipment Recommendations  3in1 (PT)    Recommendations for Other Services       Precautions / Restrictions Precautions Precaution Comments: monitor sats      Mobility  Bed Mobility Overal bed mobility: Modified Independent                  Transfers Overall transfer level: Needs assistance Equipment used: None Transfers: Sit to/from Stand Sit to Stand: Min guard         General transfer comment: min/guard for safety  Ambulation/Gait Ambulation/Gait  assistance: Min assist;Min guard Gait Distance (Feet): 30 Feet (total) Assistive device: None Gait Pattern/deviations: Step-through pattern;Decreased stride length     General Gait Details: inititially required min assist due to LOB from his slippers so returned to sitting and donned grip socks instead and pt able to ambulate another 20 feet without unsteadiness however fatigued quickly; SpO2 89-92% on room air  Stairs            Wheelchair Mobility    Modified Rankin (Stroke Patients Only)       Balance Overall balance assessment: Mild deficits observed, not formally tested                                           Pertinent Vitals/Pain Pain Assessment: No/denies pain    Home Living Family/patient expects to be discharged to:: Private residence Living Arrangements: Alone Available Help at Discharge: Family;Available PRN/intermittently Type of Home: Apartment Home Access: Level entry     Home Layout: One level Home Equipment: Walker - 2 wheels      Prior Function Level of Independence: Independent               Hand Dominance        Extremity/Trunk Assessment        Lower Extremity Assessment Lower Extremity Assessment: Generalized weakness       Communication   Communication: No difficulties  Cognition Arousal/Alertness: Awake/alert  Behavior During Therapy: WFL for tasks assessed/performed Overall Cognitive Status: Within Functional Limits for tasks assessed                                        General Comments      Exercises     Assessment/Plan    PT Assessment Patient needs continued PT services  PT Problem List Decreased mobility;Decreased activity tolerance;Decreased balance;Cardiopulmonary status limiting activity       PT Treatment Interventions DME instruction;Gait training;Balance training;Therapeutic exercise;Functional mobility training;Therapeutic activities;Patient/family  education;Stair training    PT Goals (Current goals can be found in the Care Plan section)  Acute Rehab PT Goals PT Goal Formulation: With patient Time For Goal Achievement: 08/28/20 Potential to Achieve Goals: Good    Frequency Min 3X/week   Barriers to discharge        Co-evaluation               AM-PAC PT "6 Clicks" Mobility  Outcome Measure Help needed turning from your back to your side while in a flat bed without using bedrails?: None Help needed moving from lying on your back to sitting on the side of a flat bed without using bedrails?: None Help needed moving to and from a bed to a chair (including a wheelchair)?: A Little Help needed standing up from a chair using your arms (e.g., wheelchair or bedside chair)?: A Little Help needed to walk in hospital room?: A Little Help needed climbing 3-5 steps with a railing? : A Little 6 Click Score: 20    End of Session   Activity Tolerance: Patient tolerated treatment well Patient left: in chair;with call bell/phone within reach (pt aware to call for assist for safety)   PT Visit Diagnosis: Difficulty in walking, not elsewhere classified (R26.2)    Time: 3790-2409 PT Time Calculation (min) (ACUTE ONLY): 18 min   Charges:   PT Evaluation $PT Eval Low Complexity: 1 Low     Kati PT, DPT Acute Rehabilitation Services Pager: [phone] Office: [phone] Maida Sale E 08/14/2020, 4:34 PM

## 2020-08-14 NOTE — Progress Notes (Signed)
Bilateral lower extremity venous study completed.      Please see CV Proc for preliminary results.   Skie Vitrano, RVT  

## 2020-08-14 NOTE — Progress Notes (Signed)
   08/13/20 2239  Assess: MEWS Score  Temp 98.1 F (36.7 C)  BP 99/69  Pulse Rate 95  Resp (!) 24  SpO2 98 %  O2 Device Nasal Cannula  O2 Flow Rate (L/min) 1 L/min  Assess: MEWS Score  MEWS Temp 0  MEWS Systolic 1  MEWS Pulse 0  MEWS RR 1  MEWS LOC 0  MEWS Score 2  MEWS Score Color Yellow  Assess: if the MEWS score is Yellow or Red  Were vital signs taken at a resting state? Yes  Focused Assessment No change from prior assessment  Early Detection of Sepsis Score *See Row Information* Medium  MEWS guidelines implemented *See Row Information* No, previously yellow, continue vital signs every 4 hours  Treat  MEWS Interventions Other (Comment)  Take Vital Signs  Increase Vital Sign Frequency  Yellow: Q 2hr X 2 then Q 4hr X 2, if remains yellow, continue Q 4hrs  Escalate  MEWS: Escalate Yellow: discuss with charge nurse/RN and consider discussing with provider and RRT

## 2020-08-14 NOTE — Progress Notes (Signed)
No acute episodes noted throughout the shift. Patient stayed on room air throughout entire shift. No complaints or signs of distress noted. Call bell in reach.

## 2020-08-14 NOTE — Progress Notes (Signed)
   08/13/20 2037  Assess: MEWS Score  BP 98/65  Pulse Rate 88  Resp (!) 22  SpO2 95 %  Assess: MEWS Score  MEWS Temp 0  MEWS Systolic 1  MEWS Pulse 0  MEWS RR 1  MEWS LOC 0  MEWS Score 2  MEWS Score Color Yellow  Assess: if the MEWS score is Yellow or Red  Were vital signs taken at a resting state? Yes  Focused Assessment No change from prior assessment  Early Detection of Sepsis Score *See Row Information* Medium  MEWS guidelines implemented *See Row Information* Yes  Treat  MEWS Interventions Administered scheduled meds/treatments  Pain Scale 0-10  Pain Score 0  Take Vital Signs  Increase Vital Sign Frequency  Yellow: Q 2hr X 2 then Q 4hr X 2, if remains yellow, continue Q 4hrs  Escalate  MEWS: Escalate Yellow: discuss with charge nurse/RN and consider discussing with provider and RRT  Notify: Charge Nurse/RN  Name of Charge Nurse/RN Notified ReneeRN  Date Charge Nurse/RN Notified 08/13/20  Time Charge Nurse/RN Notified 2131  Document  Patient Outcome Other (Comment) (continue to monitor)  Progress note created (see row info) Yes  Pt with a soft B/P, encouraged to increase oral fluid intake

## 2020-08-15 DIAGNOSIS — J1282 Pneumonia due to coronavirus disease 2019: Secondary | ICD-10-CM | POA: Diagnosis not present

## 2020-08-15 DIAGNOSIS — U071 COVID-19: Secondary | ICD-10-CM | POA: Diagnosis not present

## 2020-08-15 LAB — CBC WITH DIFFERENTIAL/PLATELET
Abs Immature Granulocytes: 0.41 10*3/uL — ABNORMAL HIGH (ref 0.00–0.07)
Basophils Absolute: 0 10*3/uL (ref 0.0–0.1)
Basophils Relative: 0 %
Eosinophils Absolute: 0 10*3/uL (ref 0.0–0.5)
Eosinophils Relative: 0 %
HCT: 33.6 % — ABNORMAL LOW (ref 39.0–52.0)
Hemoglobin: 11.3 g/dL — ABNORMAL LOW (ref 13.0–17.0)
Immature Granulocytes: 3 %
Lymphocytes Relative: 17 %
Lymphs Abs: 2.2 10*3/uL (ref 0.7–4.0)
MCH: 29.7 pg (ref 26.0–34.0)
MCHC: 33.6 g/dL (ref 30.0–36.0)
MCV: 88.4 fL (ref 80.0–100.0)
Monocytes Absolute: 1.4 10*3/uL — ABNORMAL HIGH (ref 0.1–1.0)
Monocytes Relative: 11 %
Neutro Abs: 9.1 10*3/uL — ABNORMAL HIGH (ref 1.7–7.7)
Neutrophils Relative %: 69 %
Platelets: 314 10*3/uL (ref 150–400)
RBC: 3.8 MIL/uL — ABNORMAL LOW (ref 4.22–5.81)
RDW: 13.5 % (ref 11.5–15.5)
WBC: 13.2 10*3/uL — ABNORMAL HIGH (ref 4.0–10.5)
nRBC: 0 % (ref 0.0–0.2)

## 2020-08-15 LAB — BASIC METABOLIC PANEL
Anion gap: 10 (ref 5–15)
BUN: 41 mg/dL — ABNORMAL HIGH (ref 8–23)
CO2: 21 mmol/L — ABNORMAL LOW (ref 22–32)
Calcium: 8.4 mg/dL — ABNORMAL LOW (ref 8.9–10.3)
Chloride: 96 mmol/L — ABNORMAL LOW (ref 98–111)
Creatinine, Ser: 1.27 mg/dL — ABNORMAL HIGH (ref 0.61–1.24)
GFR, Estimated: >60 mL/min (ref >60)
Glucose, Bld: 94 mg/dL (ref 70–99)
Potassium: 5.2 mmol/L — ABNORMAL HIGH (ref 3.5–5.1)
Sodium: 127 mmol/L — ABNORMAL LOW (ref 135–145)

## 2020-08-15 LAB — MAGNESIUM: Magnesium: 2.2 mg/dL (ref 1.7–2.4)

## 2020-08-15 LAB — PHOSPHORUS: Phosphorus: 3.4 mg/dL (ref 2.5–4.6)

## 2020-08-15 MED ORDER — SODIUM CHLORIDE 1 G PO TABS: 1.0000 g | ORAL_TABLET | Freq: Two times a day (BID) | ORAL | 0 refills | Status: DC

## 2020-08-15 MED ORDER — PREDNISONE 20 MG PO TABS: 40.0000 mg | ORAL_TABLET | Freq: Every day | ORAL | 0 refills | Status: AC

## 2020-08-15 MED ORDER — HYDROCORTISONE 1 % EX CREA: TOPICAL_CREAM | Freq: Two times a day (BID) | CUTANEOUS | 0 refills | Status: DC

## 2020-08-15 MED ORDER — ALBUTEROL SULFATE HFA 108 (90 BASE) MCG/ACT IN AERS: 2.0000 | INHALATION_SPRAY | RESPIRATORY_TRACT | 0 refills | Status: DC | PRN

## 2020-08-15 MED ORDER — SALINE SPRAY 0.65 % NA SOLN: 1.0000 | NASAL | 0 refills | Status: DC | PRN

## 2020-08-15 MED ORDER — POLYETHYLENE GLYCOL 3350 17 G PO PACK
17.0000 g | PACK | Freq: Every day | ORAL | Status: DC
  Administered 2020-08-15: 17 g via ORAL
  Filled 2020-08-15: qty 1

## 2020-08-15 MED ORDER — SODIUM CHLORIDE 1 G PO TABS
1.0000 g | ORAL_TABLET | Freq: Two times a day (BID) | ORAL | Status: DC
  Administered 2020-08-15: 1 g via ORAL
  Filled 2020-08-15 (×2): qty 1

## 2020-08-15 MED ORDER — BARICITINIB 2 MG PO TABS: 4.0000 mg | ORAL_TABLET | Freq: Every day | ORAL | Status: DC

## 2020-08-15 MED ORDER — ACETAMINOPHEN 325 MG PO TABS: 650.0000 mg | ORAL_TABLET | Freq: Four times a day (QID) | ORAL | Status: DC | PRN

## 2020-08-15 MED ORDER — GUAIFENESIN-DM 100-10 MG/5ML PO SYRP: 10.0000 mL | ORAL_SOLUTION | ORAL | 0 refills | Status: DC | PRN

## 2020-08-15 NOTE — Progress Notes (Signed)
[facility] called and CSW will bring D/C packet to pt's RN now.  [facility] dispatcher aware pt has o2 tank and foldable walker and that pt will have to ave family pick up pt's bedside toilet although they will ask [facility] if [facility] can take it with the pt.  CSW will continue to follow for D/C needs.  Dorothe Pea. Jeremiah Tarpley  MSW, LCSW, LCAS, CCS Transitions of Care Clinical Social Worker Care Coordination Department Ph: [phone]

## 2020-08-15 NOTE — Discharge Instructions (Signed)
COVID-19 COVID-19 is a respiratory infection that is caused by a virus called severe acute respiratory syndrome coronavirus 2 (SARS-CoV-2). The disease is also known as coronavirus disease or novel coronavirus. In some people, the virus may not cause any symptoms. In others, it may cause a serious infection. The infection can get worse quickly and can lead to complications, such as:  Pneumonia, or infection of the lungs.  Acute respiratory distress syndrome or ARDS. This is a condition in which fluid build-up in the lungs prevents the lungs from filling with air and passing oxygen into the blood.  Acute respiratory failure. This is a condition in which there is not enough oxygen passing from the lungs to the body or when carbon dioxide is not passing from the lungs out of the body.  Sepsis or septic shock. This is a serious bodily reaction to an infection.  Blood clotting problems.  Secondary infections due to bacteria or fungus.  Organ failure. This is when your body's organs stop working. The virus that causes COVID-19 is contagious. This means that it can spread from person to person through droplets from coughs and sneezes (respiratory secretions). What are the causes? This illness is caused by a virus. You may catch the virus by:  Breathing in droplets from an infected person. Droplets can be spread by a person breathing, speaking, singing, coughing, or sneezing.  Touching something, like a table or a doorknob, that was exposed to the virus (contaminated) and then touching your mouth, nose, or eyes. What increases the risk? Risk for infection You are more likely to be infected with this virus if you:  Are within 6 feet (2 meters) of a person with COVID-19.  Provide care for or live with a person who is infected with COVID-19.  Spend time in crowded indoor spaces or live in shared housing. Risk for serious illness You are more likely to become seriously ill from the virus if  you:  Are 51 years of age or older. The higher your age, the more you are at risk for serious illness.  Live in a nursing home or long-term care facility.  Have cancer.  Have a long-term (chronic) disease such as: ? Chronic lung disease, including chronic obstructive pulmonary disease or asthma. ? A long-term disease that lowers your body's ability to fight infection (immunocompromised). ? Heart disease, including heart failure, a condition in which the arteries that lead to the heart become narrow or blocked (coronary artery disease), a disease which makes the heart muscle thick, weak, or stiff (cardiomyopathy). ? Diabetes. ? Chronic kidney disease. ? Sickle cell disease, a condition in which red blood cells have an abnormal "sickle" shape. ? Liver disease.  Are obese. What are the signs or symptoms? Symptoms of this condition can range from mild to severe. Symptoms may appear any time from 2 to 14 days after being exposed to the virus. They include:  A fever or chills.  A cough.  Difficulty breathing.  Headaches, body aches, or muscle aches.  Runny or stuffy (congested) nose.  A sore throat.  New loss of taste or smell. Some people may also have stomach problems, such as nausea, vomiting, or diarrhea. Other people may not have any symptoms of COVID-19. How is this diagnosed? This condition may be diagnosed based on:  Your signs and symptoms, especially if: ? You live in an area with a COVID-19 outbreak. ? You recently traveled to or from an area where the virus is common. ? You  provide care for or live with a person who was diagnosed with COVID-19. ? You were exposed to a person who was diagnosed with COVID-19.  A physical exam.  Lab tests, which may include: ? Taking a sample of fluid from the back of your nose and throat (nasopharyngeal fluid), your nose, or your throat using a swab. ? A sample of mucus from your lungs (sputum). ? Blood tests.  Imaging tests,  which may include, X-rays, CT scan, or ultrasound. How is this treated? At present, there is no medicine to treat COVID-19. Medicines that treat other diseases are being used on a trial basis to see if they are effective against COVID-19. Your health care provider will talk with you about ways to treat your symptoms. For most people, the infection is mild and can be managed at home with rest, fluids, and over-the-counter medicines. Treatment for a serious infection usually takes places in a hospital intensive care unit (ICU). It may include one or more of the following treatments. These treatments are given until your symptoms improve.  Receiving fluids and medicines through an IV.  Supplemental oxygen. Extra oxygen is given through a tube in the nose, a face mask, or a hood.  Positioning you to lie on your stomach (prone position). This makes it easier for oxygen to get into the lungs.  Continuous positive airway pressure (CPAP) or bi-level positive airway pressure (BPAP) machine. This treatment uses mild air pressure to keep the airways open. A tube that is connected to a motor delivers oxygen to the body.  Ventilator. This treatment moves air into and out of the lungs by using a tube that is placed in your windpipe.  Tracheostomy. This is a procedure to create a hole in the neck so that a breathing tube can be inserted.  Extracorporeal membrane oxygenation (ECMO). This procedure gives the lungs a chance to recover by taking over the functions of the heart and lungs. It supplies oxygen to the body and removes carbon dioxide. Follow these instructions at home: Lifestyle  If you are sick, stay home except to get medical care. Your health care provider will tell you how long to stay home. Call your health care provider before you go for medical care.  Rest at home as told by your health care provider.  Do not use any products that contain nicotine or tobacco, such as cigarettes,  e-cigarettes, and chewing tobacco. If you need help quitting, ask your health care provider.  Return to your normal activities as told by your health care provider. Ask your health care provider what activities are safe for you. General instructions  Take over-the-counter and prescription medicines only as told by your health care provider.  Drink enough fluid to keep your urine pale yellow.  Keep all follow-up visits as told by your health care provider. This is important. How is this prevented?  There is no vaccine to help prevent COVID-19 infection. However, there are steps you can take to protect yourself and others from this virus. To protect yourself:   Do not travel to areas where COVID-19 is a risk. The areas where COVID-19 is reported change often. To identify high-risk areas and travel restrictions, check the CDC travel website: FatFares.com.br  If you live in, or must travel to, an area where COVID-19 is a risk, take precautions to avoid infection. ? Stay away from people who are sick. ? Wash your hands often with soap and water for 20 seconds. If soap and water  are not available, use an alcohol-based hand sanitizer. ? Avoid touching your mouth, face, eyes, or nose. ? Avoid going out in public, follow guidance from your state and local health authorities. ? If you must go out in public, wear a cloth face covering or face mask. Make sure your mask covers your nose and mouth. ? Avoid crowded indoor spaces. Stay at least 6 feet (2 meters) away from others. ? Disinfect objects and surfaces that are frequently touched every day. This may include:  Counters and tables.  Doorknobs and light switches.  Sinks and faucets.  Electronics, such as phones, remote controls, keyboards, computers, and tablets. To protect others: If you have symptoms of COVID-19, take steps to prevent the virus from spreading to others.  If you think you have a COVID-19 infection, contact  your health care provider right away. Tell your health care team that you think you may have a COVID-19 infection.  Stay home. Leave your house only to seek medical care. Do not use public transport.  Do not travel while you are sick.  Wash your hands often with soap and water for 20 seconds. If soap and water are not available, use alcohol-based hand sanitizer.  Stay away from other members of your household. Let healthy household members care for children and pets, if possible. If you have to care for children or pets, wash your hands often and wear a mask. If possible, stay in your own room, separate from others. Use a different bathroom.  Make sure that all people in your household wash their hands well and often.  Cough or sneeze into a tissue or your sleeve or elbow. Do not cough or sneeze into your hand or into the air.  Wear a cloth face covering or face mask. Make sure your mask covers your nose and mouth. Where to find more information  Centers for Disease Control and Prevention: PurpleGadgets.be  World Health Organization: https://www.castaneda.info/ Contact a health care provider if:  You live in or have traveled to an area where COVID-19 is a risk and you have symptoms of the infection.  You have had contact with someone who has COVID-19 and you have symptoms of the infection. Get help right away if:  You have trouble breathing.  You have pain or pressure in your chest.  You have confusion.  You have bluish lips and fingernails.  You have difficulty waking from sleep.  You have symptoms that get worse. These symptoms may represent a serious problem that is an emergency. Do not wait to see if the symptoms will go away. Get medical help right away. Call your local emergency services (911 in the [place] Do not drive yourself to the hospital. Let the emergency medical personnel know if you think you have  COVID-19. Summary  COVID-19 is a respiratory infection that is caused by a virus. It is also known as coronavirus disease or novel coronavirus. It can cause serious infections, such as pneumonia, acute respiratory distress syndrome, acute respiratory failure, or sepsis.  The virus that causes COVID-19 is contagious. This means that it can spread from person to person through droplets from breathing, speaking, singing, coughing, or sneezing.  You are more likely to develop a serious illness if you are 70 years of age or older, have a weak immune system, live in a nursing home, or have chronic disease.  There is no medicine to treat COVID-19. Your health care provider will talk with you about ways to treat your symptoms.  Take steps to protect yourself and others from infection. Wash your hands often and disinfect objects and surfaces that are frequently touched every day. Stay away from people who are sick and wear a mask if you are sick. This information is not intended to replace advice given to you by your health care provider. Make sure you discuss any questions you have with your health care provider. Document Revised: 05/27/2019 Document Reviewed: 09/02/2018 Elsevier Patient Education  2020 [employer].  COVID-19: How to Protect Yourself and Others Know how it spreads  There is currently no vaccine to prevent coronavirus disease 2019 (COVID-19).  The best way to prevent illness is to avoid being exposed to this virus.  The virus is thought to spread mainly from person-to-person. ? Between people who are in close contact with one another (within about 6 feet). ? Through respiratory droplets produced when an infected person coughs, sneezes or talks. ? These droplets can land in the mouths or noses of people who are nearby or possibly be inhaled into the lungs. ? COVID-19 may be spread by people who are not showing symptoms. Everyone should Clean your hands often  Wash your hands  often with soap and water for at least 20 seconds especially after you have been in a public place, or after blowing your nose, coughing, or sneezing.  If soap and water are not readily available, use a hand sanitizer that contains at least 60% alcohol. Cover all surfaces of your hands and rub them together until they feel dry.  Avoid touching your eyes, nose, and mouth with unwashed hands. Avoid close contact  Limit contact with others as much as possible.  Avoid close contact with people who are sick.  Put distance between yourself and other people. ? Remember that some people without symptoms may be able to spread virus. ? This is especially important for people who are at higher risk of getting very GainPain.com.cy Cover your mouth and nose with a mask when around others  You could spread COVID-19 to others even if you do not feel sick.  Everyone should wear a mask in public settings and when around people not living in their household, especially when social distancing is difficult to maintain. ? Masks should not be placed on young children under age 2, anyone who has trouble breathing, or is unconscious, incapacitated or otherwise unable to remove the mask without assistance.  The mask is meant to protect other people in case you are infected.  Do NOT use a facemask meant for a Dietitian.  Continue to keep about 6 feet between yourself and others. The mask is not a substitute for social distancing. Cover coughs and sneezes  Always cover your mouth and nose with a tissue when you cough or sneeze or use the inside of your elbow.  Throw used tissues in the trash.  Immediately wash your hands with soap and water for at least 20 seconds. If soap and water are not readily available, clean your hands with a hand sanitizer that contains at least 60% alcohol. Clean and disinfect  Clean AND disinfect  frequently touched surfaces daily. This includes tables, doorknobs, light switches, countertops, handles, desks, phones, keyboards, toilets, faucets, and sinks. RackRewards.fr  If surfaces are dirty, clean them: Use detergent or soap and water prior to disinfection.  Then, use a household disinfectant. You can see a list of EPA-registered household disinfectants here. michellinders.com 04/13/2019 This information is not intended to replace advice given to you by  your health care provider. Make sure you discuss any questions you have with your health care provider. Document Revised: 04/21/2019 Document Reviewed: 02/17/2019 Elsevier Patient Education  2020 [employer].  COVID-19: Quarantine vs. Isolation QUARANTINE keeps someone who was in close contact with someone who has COVID-19 away from others. If you had close contact with a person who has COVID-19  Stay home until 14 days after your last contact.  Check your temperature twice a day and watch for symptoms of COVID-19.  If possible, stay away from people who are at higher-risk for getting very sick from COVID-19. ISOLATION keeps someone who is sick or tested positive for COVID-19 without symptoms away from others, even in their own home. If you are sick and think or know you have COVID-19  Stay home until after ? At least 10 days since symptoms first appeared and ? At least 24 hours with no fever without fever-reducing medication and ? Symptoms have improved If you tested positive for COVID-19 but do not have symptoms  Stay home until after ? 10 days have passed since your positive test If you live with others, stay in a specific "sick room" or area and away from other people or animals, including pets. Use a separate bathroom, if available. SouthAmericaFlowers.co.uk 02/28/2019 This information is not intended to replace advice given to you by your health care  provider. Make sure you discuss any questions you have with your health care provider. Document Revised: 07/14/2019 Document Reviewed: 07/14/2019 Elsevier Patient Education  2020 [employer].

## 2020-08-15 NOTE — Progress Notes (Signed)
SATURATION QUALIFICATIONS: (This note is used to comply with regulatory documentation for home oxygen)  Patient Saturations on Room Air at Rest = 93%  Patient Saturations on Room Air while Ambulating = 86%  Patient Saturations on 1 Liters of oxygen while Ambulating 93%  Please briefly explain why patient needs home oxygen:  desats with any exertion without oxygen

## 2020-08-15 NOTE — Progress Notes (Addendum)
CSW received a call from pt's RN via thr [facility] that the pt needs transport to [place].  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  RN can be reached at ph: [phone].  [address]  [place] [address].  [employer] [facility] [id number] called and refused upon learning pt is COVID+ and agreed with the dispatcher that no other driver would agree either.  CSW will arrange PTAR now.  CSW will continue to follow for D/C needs.  Dorothe Pea. Sheehan Stacey  MSW, LCSW, LCAS, CCS Transitions of Care Clinical Social Worker Care Coordination Department Ph: [phone]

## 2020-08-15 NOTE — Discharge Summary (Addendum)
Physician Discharge Summary  CELIA GIBBONS HUT:[id number] DOB: October 16, 1955 DOA: 08/08/2020  PCP: Dettinger, Elige Radon, MD  Admit date: 08/08/2020 Discharge date: 08/15/2020  Admitted From: Home    Disposition:  Home   Recommendations for Outpatient Follow-up:  1. Follow up with PCP in 1 weeks 2. Please obtain BMP/CBC in one week--Needs monitoring of his sodium level. Needs monitoring of his BP, lisinopril was discontinued.    Home Health:Yes Equipment/Devices:Oxygen, 3:1, walker   Discharge Condition:Stable CODE STATUS:Full Diet recommendation: Regular   Brief/Interim Summary: He reports feeling much better today. He is requiring 2lpm with ambulation and when he talks while resting. His cough has improved. His sodium level is low and he was started on salt tablets. He needs to follow up with his PCP for repeat labs and monitoring of his sodium level.  His BP is stable, lisinopril remains on hold and was discontinued at the time of discharge. He needs continued BP monitoring and management after discharge--will have HH RN and will follow up with his PCP.  He was seen by PT with recommendation for SNF placement but he refuses to go to SNF. He is agreeable to [facility] services and HH PT, OT, and RN were ordered. He reports that his ex-wife will come and check on him after his discharge.   Please see below for further details of this hospital course:  COVID pneumonia Acute respiratory failure with hypoxia  -Presented with worsening Covid symptoms -COVID test: PCR positive -Chest imaging: CT chest with extensive bilateral COVID-19 pneumonia. -Treatment: Completed a course of IV remdesivir, also given baricitinib. Currently on IV Solu-Medrol 40 mg daily.   -Progression: Improving satisfactory.  Not on supplemental oxygen today.  But continues to feel weak.  I would switch to oral prednisone.  Continue baricitinib for now.  -On 1-2 L oxygen.  Wean down as tolerated.  Ambulate to check oxygen  requirement -Supportive care: Vitamin C, Zinc, PRN inhalers, Tylenol, Antitussives (benzonatate/ Mucinex/Tussionex).   -Encouraged incentive spirometry, prone position, out of bed and early mobilization as much as possible -Continue airborne/contact isolation precautions for duration of 3 weeks from the day of diagnosis. -WBC and inflammatory markers trend as below.  Overall improvement noted  Last Labs            Recent Labs  Lab 08/08/20 0332 08/09/20 0356 08/10/20 0355 08/11/20 0438 08/12/20 0348 08/13/20 0427 08/14/20 0355  WBC 7.1 8.4 10.4 11.5* 13.8* 19.4* 15.2*  LATICACIDVEN 1.6  --   --   --   --   --   --   PROCALCITON 0.19  --   --   --   --   --   --   DDIMER >20.00* >20.00* 10.18* 7.81* 3.79* 3.81*  --   FERRITIN 1,192* 1,076*  --   --   --   --   --   LDH 341*  --   --   --   --   --   --   CRP 17.6* 10.5* 5.2* 3.1* 1.4* 0.9  --   ALT 68* 69* 68* 92* 74* 65*  --      The treatment plan and use of medications and known side effects were discussed with patient/family. Some of the medications used are based on case reports/anecdotal data. All other medications being used in the management of COVID-19 based on limited study data. Complete risks and long-term side effects are unknown, however in the best clinical judgment they seem to be of some  benefit. Patientwanted to proceed with treatment options provided.  Acute pulmonary embolism Intramuscular hematoma -CT angio on admission [place] small amount of acute pulmonary embolism in the lingula.  Patient was started on heparin drip and subsequently switched to Eliquis. -1/2, patient complained of tender firmness in left lower quadrant.  X-ray abdomen was unremarkable. -1/3, CT abdomen and pelvis was obtained which showed acute 5.5 x 11 x 12 cm left rectus sheath hematoma extending to the extraperitoneal and retroperitoneal spaces as a result on the left inguinal canal.  Eliquis was subsequently stopped.  Because of the  fact that the home embolism was small in size, I would refrain from anticoagulation at this time. -Hemoglobin gradually dropping but is still remains more than 12.  Continue to monitor -Pain control with Tylenol as needed. Recent Labs (within last 365 days)           Recent Labs    08/08/20 0332 08/09/20 0356 08/10/20 0355 08/11/20 0438 08/12/20 0348 08/13/20 0427 08/14/20 0355  HGB 13.7 13.2 13.0 13.4 13.7 12.9* 12.3*     Transaminitis  -likely due to Covid infection,overallimproving Last Labs          Recent Labs  Lab 08/09/20 0356 08/10/20 0355 08/11/20 0438 08/12/20 0348 08/13/20 0427  AST 51* 40 67* 28 24  ALT 69* 68* 92* 74* 65*  ALKPHOS 43 39 40 37* 37*  BILITOT 0.6 0.7 0.6 0.5 0.7  PROT 6.5 6.2* 6.4* 6.4* 6.3*  ALBUMIN 2.5* 2.5* 2.6* 2.7* 2.9*     Hyperkalemia -Potassium is running elevated.  Unclear reason.  Not on supplemental potassium or potassium sparing diuretics.  Continue to monitor. Last Labs            Recent Labs  Lab 08/08/20 0332 08/09/20 0356 08/10/20 0355 08/11/20 0438 08/12/20 0348 08/13/20 0427 08/14/20 0355  K 4.1 4.7 5.0 5.3* 4.8 5.2* 5.2*     Hyponatremia -Sodium level down to 129 today.  Probably because of low solute intake.  Encourage oral appetite and hydration.  Continue to monitor. Last Labs            Recent Labs  Lab 08/08/20 0332 08/09/20 0356 08/10/20 0355 08/11/20 0438 08/12/20 0348 08/13/20 0427 08/14/20 0355  NA 133* 135 134* 133* 136 133* 129*     Hyperlipidemia -Resume statin.  Hypertension -Blood pressure is currently controlled on blood pressure. Continue home medication Lopressor, -Plan to holdlisinopril due to impaired renal function and tendency to have hyperkalemia  CKD 3 a -Creatinine appear at baseline -Lisinopril on hold.   Discharge Diagnoses:  Principal Problem:   Pneumonia due to COVID-19 virus Active Problems:   Essential hypertension   Hypercholesterolemia    Transaminitis   Moderate protein malnutrition (HCC)   Acute respiratory failure with hypoxia [facility])    Discharge Instructions  Discharge Instructions    Diet general   Complete by: As directed    Face-to-face encounter (required for Medicare/Medicaid patients)   Complete by: As directed    I Ky Barban certify that this patient is under my care and that I, or a nurse practitioner or physician's assistant working with me, had a face-to-face encounter that meets the physician face-to-face encounter requirements with this patient on 08/15/2020. The encounter with the patient was in whole, or in part for the following medical condition(s) which is the primary reason for home health care (List medical condition): COVID-19 pneumonia with acute respiratory failure, physical debility   The encounter with the patient was  in whole, or in part, for the following medical condition, which is the primary reason for home health care: COVID 19 pneumonia, physical debility, acute respiratory failure   I certify that, based on my findings, the following services are medically necessary home health services:  Nursing Physical therapy     Reason for Medically Necessary Home Health Services:  Skilled Nursing- Change/Decline in Patient Status Therapy- Personnel officer, Public librarian Therapy- Home Adaptation to Facilitate Safety     My clinical findings support the need for the above services: Shortness of breath with activity   Further, I certify that my clinical findings support that this patient is homebound due to: Unsafe ambulation due to balance issues   Home Health   Complete by: As directed    To provide the following care/treatments:  PT OT RN     Increase activity slowly   Complete by: As directed      Allergies as of 08/15/2020      Reactions   Codeine Nausea And Vomiting   Quinolones    Patient was warned about not using Cipro and similar antibiotics. Recent  studies have raised concern that fluoroquinolone antibiotics could be associated with an increased risk of aortic aneurysm Fluoroquinolones have non-antimicrobial properties that might jeopardise the integrity of the extracellular matrix of the vascular wall In a  propensity score matched cohort study in [place], there was a 66% increased rate of aortic aneurysm or dissection associated with oral fluoroquinolone use, compared wit      Medication List    STOP taking these medications   amoxicillin-clavulanate 875-125 MG tablet Commonly known as: AUGMENTIN   CORICIDIN HBP COLD/FLU PO   lisinopril 10 MG tablet Commonly known as: ZESTRIL     TAKE these medications   acetaminophen 325 MG tablet Commonly known as: TYLENOL Take 2 tablets (650 mg total) by mouth every 6 (six) hours as needed for mild pain (or Fever >/= 101).   albuterol 108 (90 Base) MCG/ACT inhaler Commonly known as: VENTOLIN HFA Inhale 2 puffs into the lungs every 4 (four) hours as needed for wheezing or shortness of breath.   aspirin 81 MG tablet Take 1 tablet (81 mg total) by mouth daily.   fluticasone 50 MCG/ACT nasal spray Commonly known as: FLONASE Place 1 spray into both nostrils 2 (two) times daily as needed for allergies or rhinitis.   guaiFENesin-dextromethorphan 100-10 MG/5ML syrup Commonly known as: ROBITUSSIN DM Take 10 mLs by mouth every 4 (four) hours as needed for cough.   hydrocortisone cream 1 % Apply topically 2 (two) times daily.   metoprolol tartrate 25 MG tablet Commonly known as: LOPRESSOR TAKE 1 TABLET BY MOUTH 2 TIMES DAILY. TAKES IN ADDITION TO THE 100MG  TWICE A DAY. What changed: See the new instructions.   metoprolol tartrate 100 MG tablet Commonly known as: LOPRESSOR TAKE 1 TABLET (100 MG TOTAL) BY MOUTH 2 (TWO) TIMES DAILY. IN ADDITION TO 25MG  TWICE DAILY What changed: Another medication with the same name was changed. Make sure you understand how and when to take each.    predniSONE 20 MG tablet Commonly known as: DELTASONE Take 2 tablets (40 mg total) by mouth daily with breakfast for 4 days. Start taking on: August 16, 2020 What changed:   how much to take  how to take this  when to take this  additional instructions   simvastatin 20 MG tablet Commonly known as: ZOCOR TAKE 1 TABLET BY MOUTH EVERYDAY AT BEDTIME What  changed: See the new instructions.   sodium chloride 0.65 % Soln nasal spray Commonly known as: OCEAN Place 1 spray into both nostrils as needed for congestion.   sodium chloride 1 g tablet Take 1 tablet (1 g total) by mouth 2 (two) times daily with a meal.            Durable Medical Equipment  (From admission, onward)         Start     Ordered   08/15/20 1453  DME 3-in-1  Once        08/15/20 1453   08/15/20 1453  DME Oxygen  Once       Question Answer Comment  Length of Need 6 Months   Mode or (Route) Nasal cannula   Liters per Minute 2   Frequency Continuous (stationary and portable oxygen unit needed)   Oxygen conserving device Yes   Oxygen delivery system Gas      08/15/20 1453          Follow-up Information    Dettinger, Elige Radon, MD. Schedule an appointment as soon as possible for a visit in 1 week(s).   Specialties: Family Medicine, Cardiology Contact information: [address] [place] [address] [phone]              Allergies  Allergen Reactions  . Codeine Nausea And Vomiting  . Quinolones     Patient was warned about not using Cipro and similar antibiotics. Recent studies have raised concern that fluoroquinolone antibiotics could be associated with an increased risk of aortic aneurysm Fluoroquinolones have non-antimicrobial properties that might jeopardise the integrity of the extracellular matrix of the vascular wall In a  propensity score matched cohort study in [place], there was a 66% increased rate of aortic aneurysm or dissection associated with oral fluoroquinolone use,  compared wit    Consultations:  None   Procedures/Studies: CT ABDOMEN PELVIS WO CONTRAST  Result Date: 08/13/2020 CLINICAL DATA:  Left lower quadrant abdominal mass. EXAM: CT ABDOMEN AND PELVIS WITHOUT CONTRAST TECHNIQUE: Multidetector CT imaging of the abdomen and pelvis was performed following the standard protocol without IV contrast. COMPARISON:  X-ray abdomen 08/12/2020, CT abdomen pelvis angiography 04/20/2014, chest x-ray 08/08/2020, CT chest 06/24/2018 FINDINGS: Lower chest: Bibasilar patchy peribronchovascular consolidations. Aortic valve repair. Coronary artery calcifications. Grossly stable aneurysmal dilatation of the descending thoracic aorta consistent with known dissection. Hepatobiliary: Fluid density lesion within liver measuring to 1.8 cm represents a simple hepatic cyst (2:13). Subcentimeter hypodensities are too small to characterize. Otherwise no focal liver abnormality. No gallstones, gallbladder wall thickening, or pericholecystic fluid. No biliary dilatation. Pancreas: No focal lesion. Normal pancreatic contour. No surrounding inflammatory changes. No main pancreatic ductal dilatation. Spleen: Normal in size without focal abnormality. Adrenals/Urinary Tract: No adrenal nodule bilaterally. A 6.1 cm fluid density lesion within the left kidney likely represents a simple renal cyst. Smaller lesion subjacent to this likely represents a simple renal cyst. Suggestion of punctate nephrolithiasis on the left (2:40). No right nephrolithiasis. No hydronephrosis and no contour-deforming solid renal mass. No ureterolithiasis or hydroureter. The urinary bladder is unremarkable. Stomach/Bowel: PO contrast reaches the rectum. Stomach is within normal limits. No evidence of bowel wall thickening or dilatation. Appendix appears normal. Vascular/Lymphatic: Bilateral common iliac aneurysms with the right measuring 1.8 cm and left measuring 1.6 cm. Poorly visualized known thoracic aorta aneurysm  extending to the supra and infra abdominal aorta. No abdominal aorta aneurysm. Mild atherosclerotic plaque of the aorta and its branches. No  abdominal, pelvic, or inguinal lymphadenopathy. Reproductive: Prostate is enlarged measuring up to 5 cm. Other: No intraperitoneal free fluid. No intraperitoneal free gas. No organized fluid collection. Musculoskeletal: Heterogeneous with hematocrit level left rectus sheath hematoma measuring at least 5.5 x 11 x 12 cm. Extension into the extraperitoneal space and retroperitoneum is noted. Extension also noted along the left inguinal canal. No abdominal wall hernia. No suspicious lytic or blastic osseous lesions. No acute displaced fracture. Multilevel degenerative changes of the spine. IMPRESSION: 1. Acute, at least 5.5 x 11 x 12 cm, left rectus sheath hematoma extending to the extraperitoneal and retroperitoneal spaces as well as along the left inguinal canal. 2. 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 3. Poorly visualized/incompletely evaluated known thoracic aorta aneurysm/dissection extending to the supra and infra abdominal aorta. Aortic Atherosclerosis (ICD10-I70.0). Aortic aneurysm NOS (ICD10-I71.9) 4. Prostatomegaly. 5. Bibasilar peribronchovascular consolidations consistent with multifocal pneumonia. Recommend follow-up PA and lateral views of the chest in 3-4 weeks to ensure resolution and exclude underlying mass or disease. These results were called by telephone at the time of interpretation on 08/13/2020 at 5:55 pm to provider [facility] , who verbally acknowledged these results. Electronically Signed   By: Tish FredericksonMorgane  Naveau M.D.   On: 08/13/2020 18:09   CT ANGIO CHEST PE W OR WO CONTRAST  Addendum Date: 08/08/2020   ADDENDUM REPORT: 08/08/2020 07:23 ADDENDUM: Study discussed by telephone with Dr. Jaci CarrelHRISTOPHER POLLINA on 08/08/2020 at 0718 hours. Electronically Signed   By: Odessa FlemingH  Hall M.D.   On: 08/08/2020 07:23   Result Date: 08/08/2020 CLINICAL  DATA:  65 year old male positive COVID-19. 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 Confusion and shortness of breath. EXAM: CT ANGIOGRAPHY CHEST WITH CONTRAST TECHNIQUE: Multidetector CT imaging of the chest was performed using the standard protocol during bolus administration of intravenous contrast. Multiplanar CT image reconstructions and MIPs were obtained to evaluate the vascular anatomy. CONTRAST:  100mL OMNIPAQUE IOHEXOL 350 MG/ML SOLN COMPARISON:  Portable chest earlier today.  Chest CTA 06/24/2018. FINDINGS: Cardiovascular: Good contrast bolus timing in the pulmonary arterial tree. Respiratory motion artifact. No central or hilar pulmonary embolus. Subsegmental branches are obscured by motion. Appearance suspicious for distal branch pulmonary embolus in the lingula on series 6, image 141. But no other convincing pulmonary arterial branch occlusion. 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 Smaller true lumen and larger false lumen as before. Mild cardiomegaly has increased since 2019. No pericardial effusion. Calcified coronary artery atherosclerosis. Mediastinum/Nodes: Small reactive appearing mediastinal lymph nodes up to 10 mm short axis. Lungs/Pleura: Extensive bilateral peribronchial, peripheral, and confluent pulmonary ground-glass opacity typical for COVID-19. Pneumonia. Major airways remain patent. No pleural effusion or pneumothorax. Upper Abdomen: Stable visible upper abdominal viscera including small benign appearing liver hypodensities and exophytic left upper pole renal cyst. Musculoskeletal: Prior sternotomy. No acute osseous abnormality identified. Review of the MIP images confirms the above findings. IMPRESSION: 1. Extensive bilateral COVID-19 Pneumonia with a small volume of Acute Pulmonary Embolus in the lingula. No central or hilar PE. 2. Reactive mediastinal lymph nodes. 3. Chronic Aortic Dissection is stable since  2019. Mild cardiomegaly but no pericardial effusion. Electronically Signed: By: Odessa FlemingH  Hall M.D. On: 08/08/2020 07:14   DG Chest Port 1 View  Result Date: 08/08/2020 CLINICAL DATA:  Dyspnea EXAM: PORTABLE CHEST 1 VIEW COMPARISON:  07/11/2010 FINDINGS: The lungs are symmetrically well expanded. There are extensive bilateral asymmetric pulmonary infiltrates, in keeping with atypical infection in the appropriate  clinical setting. No pneumothorax or pleural effusion. Cardiac size within normal limits. Median sternotomy has been performed. No acute bone abnormality. IMPRESSION: Extensive bilateral asymmetric pulmonary infiltrates most in keeping with atypical infection in the appropriate clinical setting. Electronically Signed   By: Helyn Numbers MD   On: 08/08/2020 03:56   DG Abd Portable 2V  Result Date: 08/12/2020 CLINICAL DATA:  Left lower quadrant mass, pain EXAM: PORTABLE ABDOMEN - 2 VIEW COMPARISON:  CT 04/20/2014 FINDINGS: Nonobstructive bowel gas pattern. Gas throughout moderately prominent right colon and transverse colon. No small bowel distention. No organomegaly or free air. No suspicious calcification. IMPRESSION: No acute findings. Electronically Signed   By: Charlett Nose M.D.   On: 08/12/2020 12:05   VAS [place] LOWER EXTREMITY VENOUS (DVT)  Result Date: 08/14/2020  Lower Venous DVT Study Other Indications: Covid, D-dimer. Comparison Study: No previous exam Performing Technologist: Clint Guy RVT  Examination Guidelines: A complete evaluation includes B-mode imaging, spectral Doppler, color Doppler, and power Doppler as needed of all accessible portions of each vessel. Bilateral testing is considered an integral part of a complete examination. Limited examinations for reoccurring indications may be performed as noted. The reflux portion of the exam is performed with the patient in reverse Trendelenburg.  +---------+---------------+---------+-----------+----------+--------------+ RIGHT     CompressibilityPhasicitySpontaneityPropertiesThrombus Aging +---------+---------------+---------+-----------+----------+--------------+ CFV      Full           Yes      Yes                                 +---------+---------------+---------+-----------+----------+--------------+ SFJ      Full                                                        +---------+---------------+---------+-----------+----------+--------------+ FV Prox  Full                                                        +---------+---------------+---------+-----------+----------+--------------+ FV Mid   Full                                                        +---------+---------------+---------+-----------+----------+--------------+ FV DistalFull                                                        +---------+---------------+---------+-----------+----------+--------------+ PFV      Full                                                        +---------+---------------+---------+-----------+----------+--------------+ POP      Full  Yes      Yes                                 +---------+---------------+---------+-----------+----------+--------------+ PTV      Full                                                        +---------+---------------+---------+-----------+----------+--------------+ PERO     Full                                                        +---------+---------------+---------+-----------+----------+--------------+   +---------+---------------+---------+-----------+----------+--------------+ LEFT     CompressibilityPhasicitySpontaneityPropertiesThrombus Aging +---------+---------------+---------+-----------+----------+--------------+ CFV      Full           Yes      Yes                                 +---------+---------------+---------+-----------+----------+--------------+ SFJ      Full                                                         +---------+---------------+---------+-----------+----------+--------------+ FV Prox  Full                                                        +---------+---------------+---------+-----------+----------+--------------+ FV Mid   Full                                                        +---------+---------------+---------+-----------+----------+--------------+ FV DistalFull                                                        +---------+---------------+---------+-----------+----------+--------------+ PFV      Full                                                        +---------+---------------+---------+-----------+----------+--------------+ POP      Full           Yes      Yes                                 +---------+---------------+---------+-----------+----------+--------------+ PTV  Full                                                        +---------+---------------+---------+-----------+----------+--------------+ PERO     Full                                                        +---------+---------------+---------+-----------+----------+--------------+     Summary: RIGHT: - There is no evidence of deep vein thrombosis in the lower extremity.  - No cystic structure found in the popliteal fossa.  LEFT: - There is no evidence of deep vein thrombosis in the lower extremity.  - No cystic structure found in the popliteal fossa.  *See table(s) above for measurements and observations. Electronically signed by Gretta Began MD on 08/14/2020 at 2:02:52 PM.    Final       Subjective: He reports feeling much better today. His appetite is improving. He is eager to go home.  He was offered SNF placement but he is adamant about going home instead. He reports that he will have support from his ex-wife at home and agrees to home health which will be ordered at the time of his discharge.   Discharge Exam: Vitals:   08/15/20 0258 08/15/20  1337  BP: 109/67 115/76  Pulse: 89 93  Resp: 20 20  Temp: 98.1 F (36.7 C) 97.9 F (36.6 C)  SpO2: 96% 100%   Vitals:   08/14/20 1638 08/14/20 2023 08/15/20 0258 08/15/20 1337  BP: 98/62 101/66 109/67 115/76  Pulse: 100 96 89 93  Resp: 18 20 20 20   Temp: 98.1 F (36.7 C) 97.7 F (36.5 C) 98.1 F (36.7 C) 97.9 F (36.6 C)  TempSrc:  Oral  Oral  SpO2: 98% 98% 96% 100%  Weight:      Height:        General: Pt is alert, awake, not in acute distress Cardiovascular: RRR, S1/S2 + Respiratory: No cough, normal rate Abdominal: Soft, NT Extremities: no edema, no cyanosis    The results of significant diagnostics from this hospitalization (including imaging, microbiology, ancillary and laboratory) are listed below for reference.     Microbiology: Recent Results (from the past 240 hour(s))  Blood Culture (routine x 2)     Status: None   Collection Time: 08/08/20  3:21 AM   Specimen: BLOOD  Result Value Ref Range Status   Specimen Description BLOOD LEFT ANTECUBITAL  Final   Special Requests   Final    BOTTLES DRAWN AEROBIC AND ANAEROBIC Blood Culture adequate volume   Culture   Final    NO GROWTH 5 DAYS Performed at [facility], [address], [place], [place]    Report Status 08/13/2020 FINAL  Final  Blood Culture (routine x 2)     Status: None   Collection Time: 08/08/20  3:32 AM   Specimen: BLOOD LEFT HAND  Result Value Ref Range Status   Specimen Description BLOOD LEFT HAND  Final   Special Requests   Final    BOTTLES DRAWN AEROBIC AND ANAEROBIC Blood Culture adequate volume   Culture   Final    NO GROWTH 5 DAYS Performed at  [facility], [address] [place], [place] [address]    Report Status 08/13/2020 FINAL  Final     Labs: BNP (last 3 results) No results for input(s): BNP in the last 8760 hours. Basic Metabolic Panel: Recent Labs  Lab 08/11/20 0438 08/12/20 0348 08/13/20 0427 08/14/20 0355 08/15/20 0535  NA 133* 136 133* 129* 127*   K 5.3* 4.8 5.2* 5.2* 5.2*  CL 103 105 104 99 96*  CO2 21* 22 20* 17* 21*  GLUCOSE 139* 114* 139* 115* 94  BUN 42* 39* 52* 46* 41*  CREATININE 1.45* 1.44* 1.59* 1.39* 1.27*  CALCIUM 8.8* 8.9 8.6* 8.6* 8.4*  MG  --   --  2.4 2.3 2.2  PHOS  --   --  5.6* 4.1 3.4   Liver Function Tests: Recent Labs  Lab 08/09/20 0356 08/10/20 0355 08/11/20 0438 08/12/20 0348 08/13/20 0427  AST 51* 40 67* 28 24  ALT 69* 68* 92* 74* 65*  ALKPHOS 43 39 40 37* 37*  BILITOT 0.6 0.7 0.6 0.5 0.7  PROT 6.5 6.2* 6.4* 6.4* 6.3*  ALBUMIN 2.5* 2.5* 2.6* 2.7* 2.9*   No results for input(s): LIPASE, AMYLASE in the last 168 hours. No results for input(s): AMMONIA in the last 168 hours. CBC: Recent Labs  Lab 08/11/20 0438 08/12/20 0348 08/13/20 0427 08/14/20 0355 08/15/20 0535  WBC 11.5* 13.8* 19.4* 15.2* 13.2*  NEUTROABS 10.0* 10.9* 15.2* 10.6* 9.1*  HGB 13.4 13.7 12.9* 12.3* 11.3*  HCT 40.7 41.6 40.1 36.7* 33.6*  MCV 89.3 90.8 91.6 88.9 88.4  PLT 293 331 386 282 314   Cardiac Enzymes: No results for input(s): CKTOTAL, CKMB, CKMBINDEX, TROPONINI in the last 168 hours. BNP: Invalid input(s): POCBNP CBG: No results for input(s): GLUCAP in the last 168 hours. D-Dimer Recent Labs    08/13/20 0427  DDIMER 3.81*   Hgb A1c No results for input(s): HGBA1C in the last 72 hours. Lipid Profile No results for input(s): CHOL, HDL, LDLCALC, TRIG, CHOLHDL, LDLDIRECT in the last 72 hours. Thyroid function studies No results for input(s): TSH, T4TOTAL, T3FREE, THYROIDAB in the last 72 hours.  Invalid input(s): FREET3 Anemia work up No results for input(s): VITAMINB12, FOLATE, FERRITIN, TIBC, IRON, RETICCTPCT in the last 72 hours. Urinalysis    Component Value Date/Time   APPEARANCEUR Clear 02/19/2016 1031   GLUCOSEU Negative 02/19/2016 1031   BILIRUBINUR Negative 02/19/2016 1031   PROTEINUR Negative 02/19/2016 1031   UROBILINOGEN negative 04/03/2015 1557   NITRITE Negative 02/19/2016 1031    LEUKOCYTESUR Negative 02/19/2016 1031   Sepsis Labs Invalid input(s): PROCALCITONIN,  WBC,  LACTICIDVEN Microbiology Recent Results (from the past 240 hour(s))  Blood Culture (routine x 2)     Status: None   Collection Time: 08/08/20  3:21 AM   Specimen: BLOOD  Result Value Ref Range Status   Specimen Description BLOOD LEFT ANTECUBITAL  Final   Special Requests   Final    BOTTLES DRAWN AEROBIC AND ANAEROBIC Blood Culture adequate volume   Culture   Final    NO GROWTH 5 DAYS Performed at [facility], [address] [place], [place] [address]    Report Status 08/13/2020 FINAL  Final  Blood Culture (routine x 2)     Status: None   Collection Time: 08/08/20  3:32 AM   Specimen: BLOOD LEFT HAND  Result Value Ref Range Status   Specimen Description BLOOD LEFT HAND  Final   Special Requests   Final    BOTTLES DRAWN AEROBIC  AND ANAEROBIC Blood Culture adequate volume   Culture   Final    NO GROWTH 5 DAYS Performed at [facility], [address] [place], [place] [address]    Report Status 08/13/2020 FINAL  Final     Time coordinating discharge: Over 33 minutes  SIGNED:   Ky Barban, MD  [facility] 08/15/2020, 2:54 PM Pager   If 7PM-7AM, please contact night-coverage [email or web address] Password [email or web address]

## 2020-08-15 NOTE — Progress Notes (Addendum)
MD order to discharge.  Discharge instructions reviewed with pt.  Salnie lock x2 discontinued per policy & procedure. Oxygen delivered to floor prior to discharge. Notified ride for discharge.   Pt tolerated removal of saline locks and verbalizes understanding of discharge instructions.   Unable to discharge via safe ride as per [place] no longer take COVID (+) patients. He states he will contact [place]. [facility] [place] made aware.

## 2020-08-15 NOTE — TOC Transition Note (Addendum)
Transition of Care [facility]) - CM/SW Discharge Note   Patient Details  Name: Nicholas Palmer MRN: [id number] Date of Birth: 1955-11-15  Transition of Care [facility]) CM/SW Contact:  Darleene Cleaver, LCSW Phone Number: 08/15/2020, 3:11 PM   Clinical Narrative:     12:30pm  CSW spoke to [employer], and asked if they can still transport Covid + patients.  Per [employer], they can still transport patients.  CSW asked if they need 24 hour notice, per transportation services, they can try to fit patient in same day, but if not, they can schedule for the next day.  CSW updated bedside nurse.   2:15pm  CSW discussed with patient SNF verse going back home, he stated he does not want to go to a SNF for rehab.  Patient would like to go home with home health instead.  CSW asked if he has ever had home health, patient stated he has not.  CSW then asked if he had any preference and he said no.  CSW contacted [facility], and they are able to accept patient.  Patient will be going home with home health PT, RN, and OT, through [facility].  CSW signing off please reconsult with any other social work needs, home health agency has been notified of planned discharge.  Per patient he will need a ride through the [facility] transportation services, call [phone]  CSW updated bedside nurse.  Final next level of care: Home w Home Health Services Barriers to Discharge: Barriers Resolved   Patient Goals and CMS Choice Patient states their goals for this hospitalization and ongoing recovery are:: To return back home with home health. CMS Medicare.gov Compare Post Acute Care list provided to:: Patient Choice offered to / list presented to : Patient  Discharge Placement                       Discharge Plan and Services                DME Arranged: 3-N-1,Oxygen,Walker rolling DME Agency: Other - Comment [facility]) Date DME Agency Contacted: 08/15/20 Time DME Agency Contacted:  [phone] Representative spoke with at DME Agency: Vaughan Basta HH Arranged: PT,OT,RN HH Agency: [employer] [facility] Date [facility] Agency Contacted: 08/15/20 Time HH Agency Contacted: 1511 Representative spoke with at [facility] Agency: Elnita Maxwell  Social Determinants of Health (SDOH) Interventions     Readmission Risk Interventions No flowsheet data found.

## 2020-08-17 NOTE — Telephone Encounter (Signed)
Encounter closed, several weeks old

## 2020-08-20 ENCOUNTER — Telehealth: Payer: Self-pay

## 2020-08-20 NOTE — Telephone Encounter (Signed)
Spoke with patient, he reports after he called [place] he received a phone call from someone to come out and set up oxygen at home for him.  I advised patient it was recommended he have hospital follow up with [facility] and lab work performed.  An appointment was scheduled with Je on 08/27/2020.

## 2020-08-20 NOTE — Telephone Encounter (Signed)
Pt was discharged from [facility] on 08/15/2020. He says that he is suppose to get home health services for oxygen and no one has came out to his home. Pt is asking [place] for help. Please call back

## 2020-08-23 ENCOUNTER — Telehealth: Payer: Self-pay | Admitting: *Deleted

## 2020-08-23 DIAGNOSIS — N1831 Chronic kidney disease, stage 3a: Secondary | ICD-10-CM | POA: Diagnosis not present

## 2020-08-23 DIAGNOSIS — I129 Hypertensive chronic kidney disease with stage 1 through stage 4 chronic kidney disease, or unspecified chronic kidney disease: Secondary | ICD-10-CM | POA: Diagnosis not present

## 2020-08-23 DIAGNOSIS — E440 Moderate protein-calorie malnutrition: Secondary | ICD-10-CM | POA: Diagnosis not present

## 2020-08-23 DIAGNOSIS — Z86711 Personal history of pulmonary embolism: Secondary | ICD-10-CM | POA: Insufficient documentation

## 2020-08-23 DIAGNOSIS — J9601 Acute respiratory failure with hypoxia: Secondary | ICD-10-CM | POA: Diagnosis not present

## 2020-08-23 DIAGNOSIS — U071 COVID-19: Secondary | ICD-10-CM | POA: Insufficient documentation

## 2020-08-23 DIAGNOSIS — E785 Hyperlipidemia, unspecified: Secondary | ICD-10-CM | POA: Diagnosis not present

## 2020-08-23 DIAGNOSIS — Z7982 Long term (current) use of aspirin: Secondary | ICD-10-CM | POA: Insufficient documentation

## 2020-08-23 DIAGNOSIS — I2699 Other pulmonary embolism without acute cor pulmonale: Secondary | ICD-10-CM | POA: Diagnosis not present

## 2020-08-23 DIAGNOSIS — J1281 Pneumonia due to SARS-associated coronavirus: Secondary | ICD-10-CM | POA: Diagnosis not present

## 2020-08-23 NOTE — Telephone Encounter (Signed)
Okay thanks for the information we will discuss it at the visit

## 2020-08-23 NOTE — Telephone Encounter (Signed)
VM from [place] w/ [facility] FYI pt did not get written Rx's for prednisone or sodium chloride filled from hospital discharge He does have appt on Monday

## 2020-08-27 ENCOUNTER — Ambulatory Visit (INDEPENDENT_AMBULATORY_CARE_PROVIDER_SITE_OTHER): Payer: Medicare Other | Admitting: Nurse Practitioner

## 2020-08-27 ENCOUNTER — Encounter: Payer: Self-pay | Admitting: Nurse Practitioner

## 2020-08-27 ENCOUNTER — Other Ambulatory Visit: Payer: Self-pay

## 2020-08-27 DIAGNOSIS — Z09 Encounter for follow-up examination after completed treatment for conditions other than malignant neoplasm: Secondary | ICD-10-CM | POA: Diagnosis not present

## 2020-08-27 DIAGNOSIS — Z8616 Personal history of COVID-19: Secondary | ICD-10-CM

## 2020-08-27 DIAGNOSIS — J1282 Pneumonia due to coronavirus disease 2019: Secondary | ICD-10-CM | POA: Diagnosis not present

## 2020-08-27 DIAGNOSIS — U071 COVID-19: Secondary | ICD-10-CM

## 2020-08-27 NOTE — Progress Notes (Signed)
Virtual Visit via telephone Note Due to COVID-19 pandemic this visit was conducted virtually. This visit type was conducted due to national recommendations for restrictions regarding the COVID-19 Pandemic (e.g. social distancing, sheltering in place) in an effort to limit this patient's exposure and mitigate transmission in our community. All issues noted in this document were discussed and addressed.  A physical exam was not performed with this format.  I connected with Nicholas Palmer on 08/27/20 at  12:08 PM by telephone and verified that I am speaking with the correct person using two identifiers. Nicholas Palmer is currently located at home during visit. The provider, Daryll Drown, NP is located in their office at time of visit.  I discussed the limitations, risks, security and privacy concerns of performing an evaluation and management service by telephone and the availability of in person appointments. I also discussed with the patient that there may be a patient responsible charge related to this service. The patient expressed understanding and agreed to proceed.   History and Present Illness:  HPI  Patient is a 65 year old male with a history of essential hypertension, nephrolithiasis and hypercholesterolemia.  Recently discharged from hospital 08/15/2020 for COVID-19 pneumonia.  Patient presented to the emergency department with progressive worsening dyspnea, fatigue, malaise, body aches and confusion.  Patient was unvaccinated at the time of presentation to the emergency department.  Patient states he is getting better, no shortness of breath, wheezing, nausea, vomiting, constipation or fever.  Completed hospital discharge instruction with patient/medication reconciliation. patient verbalized understanding.   Review of Systems  Constitutional: Negative for chills, fever and malaise/fatigue.  HENT: Negative.  Negative for congestion.   Gastrointestinal: Negative.   Genitourinary:  Negative.   Musculoskeletal: Negative.   Skin: Negative.   Neurological: Negative.   All other systems reviewed and are negative.    Observations/Objective: Televisit patient did not sound to be in distress  Assessment and Plan: Hospital discharge follow-up Patient was recently discharged from hospital with COVID-pneumonia on 08/15/2020.  Patient's symptoms are rapidly improving.  Patient has no worsening signs/symptoms of pneumonia.  He denies fever, nausea, vomiting chills and headache.  Completed hospital discharge instructions with patient with medication reconciliation.  Patient verbalized understanding.  Follow-up and few days to repeat BMP/CBC.    Pneumonia due to COVID-19 virus Symptoms well controlled.  Labs pending CBC, BMP.  Discontinued lisinopril per hospital discharge instructions.  Patient has no new concerns today, no worsening signs and symptoms of infection. Advised patient to follow-up with worsening or unresolved symptoms.     Follow Up Instructions: Follow-up in few days to repeat CBC, BMP.    I discussed the assessment and treatment plan with the patient. The patient was provided an opportunity to ask questions and all were answered. The patient agreed with the plan and demonstrated an understanding of the instructions.   The patient was advised to call back or seek an in-person evaluation if the symptoms worsen or if the condition fails to improve as anticipated.  The above assessment and management plan was discussed with the patient. The patient verbalized understanding of and has agreed to the management plan. Patient is aware to call the clinic if symptoms persist or worsen. Patient is aware when to return to the clinic for a follow-up visit. Patient educated on when it is appropriate to go to the emergency department.   Time call ended: 12:19 PM I provided 11 minutes of non-face-to-face time during this encounter.    Nita Sells  Sissy Hoff, NP

## 2020-08-27 NOTE — Assessment & Plan Note (Signed)
Patient was recently discharged from hospital with COVID-pneumonia on 08/15/2020.  Patient's symptoms are rapidly improving.  Patient has no worsening signs/symptoms of pneumonia.  He denies fever, nausea, vomiting chills and headache.  Completed hospital discharge instructions with patient with medication reconciliation.  Patient verbalized understanding.  Follow-up and few days to repeat BMP/CBC.

## 2020-08-27 NOTE — Assessment & Plan Note (Signed)
Symptoms well controlled.  Labs pending CBC, BMP.  Discontinued lisinopril per hospital discharge instructions.  Patient has no new concerns today, no worsening signs and symptoms of infection. Advised patient to follow-up with worsening or unresolved symptoms.

## 2020-08-27 NOTE — Patient Instructions (Signed)

## 2020-09-04 DIAGNOSIS — J1281 Pneumonia due to SARS-associated coronavirus: Secondary | ICD-10-CM | POA: Diagnosis not present

## 2020-09-04 DIAGNOSIS — N1831 Chronic kidney disease, stage 3a: Secondary | ICD-10-CM | POA: Diagnosis not present

## 2020-09-04 DIAGNOSIS — I129 Hypertensive chronic kidney disease with stage 1 through stage 4 chronic kidney disease, or unspecified chronic kidney disease: Secondary | ICD-10-CM | POA: Diagnosis not present

## 2020-09-04 DIAGNOSIS — I2699 Other pulmonary embolism without acute cor pulmonale: Secondary | ICD-10-CM | POA: Diagnosis not present

## 2020-09-04 DIAGNOSIS — U071 COVID-19: Secondary | ICD-10-CM | POA: Diagnosis not present

## 2020-09-04 DIAGNOSIS — J9601 Acute respiratory failure with hypoxia: Secondary | ICD-10-CM | POA: Diagnosis not present

## 2020-09-05 ENCOUNTER — Ambulatory Visit (INDEPENDENT_AMBULATORY_CARE_PROVIDER_SITE_OTHER): Payer: Medicare Other | Admitting: Nurse Practitioner

## 2020-09-05 ENCOUNTER — Other Ambulatory Visit: Payer: Self-pay

## 2020-09-05 VITALS — BP 101/64 | HR 80 | Temp 97.4°F | Ht 69.0 in | Wt 188.6 lb

## 2020-09-05 DIAGNOSIS — J1282 Pneumonia due to coronavirus disease 2019: Secondary | ICD-10-CM

## 2020-09-05 DIAGNOSIS — U071 COVID-19: Secondary | ICD-10-CM

## 2020-09-05 NOTE — Assessment & Plan Note (Signed)
Symptoms well managed.  No new signs and symptoms of pneumonia.  Repeat chest x-ray 4 to 6 weeks.  To rule out pneumonia.

## 2020-09-05 NOTE — Progress Notes (Signed)
Established Patient Office Visit  Subjective:  Patient ID: Nicholas Palmer, male    DOB: May 26, 1956  Age: 65 y.o. MRN: [id number]  CC:  Chief Complaint  Patient presents with  . Hospitalization Follow-up    COVID PNEUMONIA    HPI Nicholas Palmer presents for follow-up after hospital discharge for Covid-19 related pneumonia.  Patient is not reporting any signs of worsening symptoms, no fever, headache nausea or vomiting.  Patient is reporting mild shortness of breath but improved from while in the hospital.  Completed hospital discharge instructions with medication reconciliation.  Patient verbalized understanding.  Patient will come back in 4 to 6 weeks for repeat chest x-ray to rule out pneumonia.  Past Medical History:  Diagnosis Date  . 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 Decreased left ventricular function June 10, 2010   EF of 40-45% w/ out regional wall motion abnormalities 2011  EF 60%echo 2014  . Hypertension   . Nephrolithiasis    has history; status post stone removal  . Tobacco abuse     Past Surgical History:  Procedure Laterality Date  . 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  06/10/10 Dr. Tyrone Sage    Family History  Problem Relation Age of Onset  . Lung disease Mother        copd  . Heart disease Father   . Heart disease Brother   . Kidney disease Sister   . Stroke Sister     Social History   Socioeconomic History  . Marital status: Legally Separated    Spouse name: Not on file  . Number of children: Not on file  . Years of education: Not on file  . Highest education level: Not on file  Occupational History  . Not on file  Tobacco Use  . Smoking status: Former Smoker    Quit date: 08/11/2002    Years since quitting: 18.0  . Smokeless tobacco: Never Used  Vaping Use  .  Vaping Use: Never used  Substance and Sexual Activity  . Alcohol use: No    Alcohol/week: 0.0 standard drinks  . Drug use: No  . Sexual activity: Not Currently    Comment: separated, no kids  Other Topics Concern  . Not on file  Social History Narrative   Has 20-pack-year history of tobacco abuse, quitting many years ago. Denies alcohol or drugs. Does not routinely exercise    Social Determinants of Health   Financial Resource Strain: Not on file  Food Insecurity: Not on file  Transportation Needs: Not on file  Physical Activity: Not on file  Stress: Not on file  Social Connections: Not on file  Intimate Partner Violence: Not on file    Outpatient Medications Prior to Visit  Medication Sig Dispense Refill  . acetaminophen (TYLENOL) 325 MG tablet Take 2 tablets (650 mg total) by mouth every 6 (six) hours as needed for mild pain (or Fever >/= 101).    Marland Kitchen aspirin 81 MG tablet Take 1 tablet (81 mg total) by mouth daily.    . fluticasone (FLONASE) 50 MCG/ACT nasal spray Place 1 spray into both nostrils 2 (two) times daily as needed for allergies or rhinitis. 16 g 6  . guaiFENesin-dextromethorphan (ROBITUSSIN DM) 100-10 MG/5ML syrup Take 10 mLs by mouth every 4 (four) hours as needed for cough.  118 mL 0  . hydrocortisone cream 1 % Apply topically 2 (two) times daily. 30 g 0  . metoprolol tartrate (LOPRESSOR) 100 MG tablet TAKE 1 TABLET (100 MG TOTAL) BY MOUTH 2 (TWO) TIMES DAILY. IN ADDITION TO 25MG  TWICE DAILY 180 tablet 3  . metoprolol tartrate (LOPRESSOR) 25 MG tablet TAKE 1 TABLET BY MOUTH 2 TIMES DAILY. TAKES IN ADDITION TO THE 100MG  TWICE A DAY. 180 tablet 3  . simvastatin (ZOCOR) 20 MG tablet TAKE 1 TABLET BY MOUTH EVERYDAY AT BEDTIME 90 tablet 3  . sodium chloride (OCEAN) 0.65 % SOLN nasal spray Place 1 spray into both nostrils as needed for congestion. 60 mL 0  . sodium chloride 1 g tablet Take 1 tablet (1 g total) by mouth 2 (two) times daily with a meal. 10 tablet 0  .  albuterol (VENTOLIN HFA) 108 (90 Base) MCG/ACT inhaler Inhale 2 puffs into the lungs every 4 (four) hours as needed for wheezing or shortness of breath. (Patient not taking: Reported on 09/05/2020) 18 g 0   No facility-administered medications prior to visit.    Allergies  Allergen Reactions  . Codeine Nausea And Vomiting  . Quinolones     Patient was warned about not using Cipro and similar antibiotics. Recent studies have raised concern that fluoroquinolone antibiotics could be associated with an increased risk of aortic aneurysm Fluoroquinolones have non-antimicrobial properties that might jeopardise the integrity of the extracellular matrix of the vascular wall In a  propensity score matched cohort study in , there was a 66% increased rate of aortic aneurysm or dissection associated with oral fluoroquinolone use, compared wit    ROS Review of Systems  HENT: Negative for congestion and sinus pressure.   Respiratory: Positive for shortness of breath. Negative for cough, chest tightness and wheezing.   Cardiovascular: Negative.   Gastrointestinal: Negative.   Genitourinary: Negative.   Musculoskeletal: Negative.   Skin: Negative.   Neurological: Negative.   Psychiatric/Behavioral: Negative.   All other systems reviewed and are negative.     Objective:    Physical Exam Vitals reviewed.  Constitutional:      Appearance: Normal appearance.  HENT:     Head: Normocephalic.     Nose: No congestion.  Eyes:     Conjunctiva/sclera: Conjunctivae normal.  Cardiovascular:     Rate and Rhythm: Normal rate and regular rhythm.     Pulses: Normal pulses.     Heart sounds: Normal heart sounds.  Pulmonary:     Comments: Shortness of breath Abdominal:     General: Bowel sounds are normal.  Musculoskeletal:        General: Normal range of motion.  Skin:    General: Skin is warm.  Neurological:     Mental Status: He is alert and oriented to person, place, and time.   Psychiatric:        Behavior: Behavior normal.     BP 101/64   Pulse 80   Temp (!) 97.4 F (36.3 C)   Ht 5\' 9"  (1.753 m)   Wt 188 lb 9.6 oz (85.5 kg)   SpO2 100%   BMI 27.85 kg/m  Wt Readings from Last 3 Encounters:  09/05/20 188 lb 9.6 oz (85.5 kg)  08/08/20 198 lb (89.8 kg)  08/17/19 197 lb (89.4 kg)     Health Maintenance Due  Topic Date Due  . Hepatitis C Screening  Never done  . COVID-19 Vaccine (1) Never done  . TETANUS/TDAP  Never done  .  COLONOSCOPY (Pts 45-25yrs Insurance coverage will need to be confirmed)  Never done  . PNA vac Low Risk Adult (1 of 2 - PCV13) Never done  . INFLUENZA VACCINE  Never done    There are no preventive care reminders to display for this patient.  Lab Results  Component Value Date   TSH 2.370 02/19/2016   Lab Results  Component Value Date   WBC 13.2 (H) 08/15/2020   HGB 11.3 (L) 08/15/2020   HCT 33.6 (L) 08/15/2020   MCV 88.4 08/15/2020   PLT 314 08/15/2020   Lab Results  Component Value Date   NA 127 (L) 08/15/2020   K 5.2 (H) 08/15/2020   CO2 21 (L) 08/15/2020   GLUCOSE 94 08/15/2020   BUN 41 (H) 08/15/2020   CREATININE 1.27 (H) 08/15/2020   BILITOT 0.7 08/13/2020   ALKPHOS 37 (L) 08/13/2020   AST 24 08/13/2020   ALT 65 (H) 08/13/2020   PROT 6.3 (L) 08/13/2020   ALBUMIN 2.9 (L) 08/13/2020   CALCIUM 8.4 (L) 08/15/2020   ANIONGAP 10 08/15/2020   Lab Results  Component Value Date   CHOL 125 12/23/2017   Lab Results  Component Value Date   HDL 33 (L) 12/23/2017   Lab Results  Component Value Date   LDLCALC 72 12/23/2017   Lab Results  Component Value Date   TRIG 108 08/08/2020   Lab Results  Component Value Date   CHOLHDL 3.8 12/23/2017   No results found for: HGBA1C    Assessment & Plan:   Problem List Items Addressed This Visit      Respiratory   Pneumonia due to COVID-19 virus - Primary    Symptoms well managed.  No new signs and symptoms of pneumonia.  Repeat chest x-ray 4 to 6 weeks.   To rule out pneumonia.      Relevant Orders   CBC with Differential/Platelet   Basic Metabolic Panel      No orders of the defined types were placed in this encounter.   Follow-up: Return if symptoms worsen or fail to improve.    Nicholas Drown, NP

## 2020-09-05 NOTE — Patient Instructions (Signed)
Follow-up in 4 to 6 weeks for repeat chest x-ray   Community-Acquired Pneumonia, Adult Pneumonia is an infection of the lungs. It causes irritation and swelling in the airways of the lungs. Mucus and fluid may also build up inside the airways. This may cause coughing and trouble breathing. One type of pneumonia can happen while you are in a hospital. A different type can happen when you are not in a hospital (community-acquired pneumonia). What are the causes? This condition is caused by germs (viruses, bacteria, or fungi). Some types of germs can spread from person to person. Pneumonia is not thought to spread from person to person.   What increases the risk? You are more likely to develop this condition if:  You have a long-term (chronic) disease, such as: ? Disease of the lungs. This may be chronic obstructive pulmonary disease (COPD) or asthma. ? Heart failure. ? Cystic fibrosis. ? Diabetes. ? Kidney disease. ? Sickle cell disease. ? HIV.  You have other health problems, such as: ? Your body's defense system (immune system) is weak. ? A condition that may cause you to breathe in fluids from your mouth and nose.  You had your spleen taken out.  You do not take good care of your teeth and mouth (poor dental hygiene).  You use or have used tobacco products.  You travel where the germs that cause this illness are common.  You are near certain animals or the places they live.  You are older than 66 years of age. What are the signs or symptoms? Symptoms of this condition include:  A cough.  A fever.  Sweating or chills.  Chest pain, often when you breathe deeply or cough.  Breathing problems, such as: ? Fast breathing. ? Trouble breathing. ? Shortness of breath.  Feeling tired (fatigued).  Muscle aches. How is this treated? Treatment for this condition depends on many things, such as:  The cause of your illness.  Your medicines.  Your other health  problems. Most adults can be treated at home. Sometimes, treatment must happen in a hospital.  Treatment may include medicines to kill germs.  Medicines may depend on which germ caused your illness. Very bad pneumonia is rare. If you get it, you may:  Have a machine to help you breathe.  Have fluid taken away from around your lungs. Follow these instructions at home: Medicines  Take over-the-counter and prescription medicines only as told by your doctor.  Take cough medicine only if you are losing sleep. Cough medicine can keep your body from taking mucus away from your lungs.  If you were prescribed an antibiotic medicine, take it as told by your doctor. Do not stop taking the antibiotic even if you start to feel better. Lifestyle  Do not drink alcohol.  Do not use any products that contain nicotine or tobacco, such as cigarettes, e-cigarettes, and chewing tobacco. If you need help quitting, ask your doctor.  Eat a healthy diet. This includes a lot of vegetables, fruits, whole grains, low-fat dairy products, and low-fat (lean) protein.      General instructions  Rest a lot. Sleep for at least 8 hours each night.  Sleep with your head and neck raised. Put a few pillows under your head or sleep in a reclining chair.  Return to your normal activities as told by your doctor. Ask your doctor what activities are safe for you.  Drink enough fluid to keep your pee (urine) pale yellow.  If your throat  is sore, rinse your mouth often with salt water. To make salt water, dissolve -1 tsp (3-6 g) of salt in 1 cup (237 mL) of warm water.  Keep all follow-up visits as told by your doctor. This is important.   How is this prevented? You can lower your risk of pneumonia by:  Getting the pneumonia shot (vaccine). These shots have different types and schedules. Ask your doctor what works best for you. Think about getting this shot if: ? You are older than 66 years of age. ? You are 13-45  years of age and:  You are being treated for cancer.  You have long-term lung disease.  You have other problems that affect your body's defense system. Ask your doctor if you have one of these.  Getting your flu shot every year. Ask your doctor which type of shot is best for you.  Going to the dentist as often as told.  Washing your hands often with soap and water for at least 20 seconds. If you cannot use soap and water, use hand sanitizer. Contact a doctor if:  You have a fever.  You lose sleep because your cough medicine does not help. Get help right away if:  You are short of breath and this gets worse.  You have more chest pain.  Your sickness gets worse. This is very serious if: ? You are an older adult. ? Your body's defense system is weak.  You cough up blood. These symptoms may be an emergency. Do not wait to see if the symptoms will go away. Get medical help right away. Call your local emergency services (911 in the [place] Do not drive yourself to the hospital. Summary  Pneumonia is an infection of the lungs.  Community-acquired pneumonia affects people who have not been in the hospital. Certain germs can cause this infection.  This condition may be treated with medicines that kill germs.  For very bad pneumonia, you may need a hospital stay and treatment to help with breathing. This information is not intended to replace advice given to you by your health care provider. Make sure you discuss any questions you have with your health care provider. Document Revised: 05/10/2019 Document Reviewed: 05/10/2019 Elsevier Patient Education  2021 [employer].

## 2020-09-06 LAB — CBC WITH DIFFERENTIAL/PLATELET
Basophils Absolute: 0 10*3/uL (ref 0.0–0.2)
Basos: 1 %
EOS (ABSOLUTE): 0.1 10*3/uL (ref 0.0–0.4)
Eos: 2 %
Hematocrit: 32.7 % — ABNORMAL LOW (ref 37.5–51.0)
Hemoglobin: 10.3 g/dL — ABNORMAL LOW (ref 13.0–17.7)
Immature Grans (Abs): 0 10*3/uL (ref 0.0–0.1)
Immature Granulocytes: 1 %
Lymphocytes Absolute: 1.1 10*3/uL (ref 0.7–3.1)
Lymphs: 18 %
MCH: 30 pg (ref 26.6–33.0)
MCHC: 31.5 g/dL (ref 31.5–35.7)
MCV: 95 fL (ref 79–97)
Monocytes Absolute: 0.8 10*3/uL (ref 0.1–0.9)
Monocytes: 13 %
Neutrophils Absolute: 4.1 10*3/uL (ref 1.4–7.0)
Neutrophils: 65 %
Platelets: 256 10*3/uL (ref 150–450)
RBC: 3.43 x10E6/uL — ABNORMAL LOW (ref 4.14–5.80)
RDW: 16 % — ABNORMAL HIGH (ref 11.6–15.4)
WBC: 6.2 10*3/uL (ref 3.4–10.8)

## 2020-09-06 LAB — BASIC METABOLIC PANEL
BUN/Creatinine Ratio: 11 (ref 10–24)
BUN: 15 mg/dL (ref 8–27)
CO2: 25 mmol/L (ref 20–29)
Calcium: 8.7 mg/dL (ref 8.6–10.2)
Chloride: 101 mmol/L (ref 96–106)
Creatinine, Ser: 1.31 mg/dL — ABNORMAL HIGH (ref 0.76–1.27)
GFR calc Af Amer: 66 mL/min/{1.73_m2} (ref >59)
GFR calc non Af Amer: 57 mL/min/{1.73_m2} — ABNORMAL LOW (ref >59)
Glucose: 101 mg/dL — ABNORMAL HIGH (ref 65–99)
Potassium: 4.7 mmol/L (ref 3.5–5.2)
Sodium: 138 mmol/L (ref 134–144)

## 2020-09-07 ENCOUNTER — Other Ambulatory Visit: Payer: Self-pay

## 2020-09-07 ENCOUNTER — Ambulatory Visit (INDEPENDENT_AMBULATORY_CARE_PROVIDER_SITE_OTHER): Payer: Medicare Other

## 2020-09-07 DIAGNOSIS — E785 Hyperlipidemia, unspecified: Secondary | ICD-10-CM

## 2020-09-07 DIAGNOSIS — Z7982 Long term (current) use of aspirin: Secondary | ICD-10-CM

## 2020-09-07 DIAGNOSIS — I129 Hypertensive chronic kidney disease with stage 1 through stage 4 chronic kidney disease, or unspecified chronic kidney disease: Secondary | ICD-10-CM

## 2020-09-07 DIAGNOSIS — J9601 Acute respiratory failure with hypoxia: Secondary | ICD-10-CM

## 2020-09-07 DIAGNOSIS — U071 COVID-19: Secondary | ICD-10-CM

## 2020-09-07 DIAGNOSIS — D649 Anemia, unspecified: Secondary | ICD-10-CM

## 2020-09-07 DIAGNOSIS — J1281 Pneumonia due to SARS-associated coronavirus: Secondary | ICD-10-CM | POA: Diagnosis not present

## 2020-09-07 DIAGNOSIS — N1831 Chronic kidney disease, stage 3a: Secondary | ICD-10-CM

## 2020-09-07 DIAGNOSIS — I2699 Other pulmonary embolism without acute cor pulmonale: Secondary | ICD-10-CM

## 2020-09-07 DIAGNOSIS — E440 Moderate protein-calorie malnutrition: Secondary | ICD-10-CM | POA: Diagnosis not present

## 2020-09-11 ENCOUNTER — Other Ambulatory Visit: Payer: Self-pay

## 2020-09-11 ENCOUNTER — Ambulatory Visit (INDEPENDENT_AMBULATORY_CARE_PROVIDER_SITE_OTHER): Payer: Medicare Other

## 2020-09-11 DIAGNOSIS — N1831 Chronic kidney disease, stage 3a: Secondary | ICD-10-CM

## 2020-09-11 DIAGNOSIS — I2699 Other pulmonary embolism without acute cor pulmonale: Secondary | ICD-10-CM | POA: Diagnosis not present

## 2020-09-11 DIAGNOSIS — J1281 Pneumonia due to SARS-associated coronavirus: Secondary | ICD-10-CM | POA: Diagnosis not present

## 2020-09-11 DIAGNOSIS — E785 Hyperlipidemia, unspecified: Secondary | ICD-10-CM

## 2020-09-11 DIAGNOSIS — Z7982 Long term (current) use of aspirin: Secondary | ICD-10-CM | POA: Diagnosis not present

## 2020-09-11 DIAGNOSIS — E440 Moderate protein-calorie malnutrition: Secondary | ICD-10-CM | POA: Diagnosis not present

## 2020-09-11 DIAGNOSIS — U071 COVID-19: Secondary | ICD-10-CM

## 2020-09-11 DIAGNOSIS — Z9981 Dependence on supplemental oxygen: Secondary | ICD-10-CM | POA: Insufficient documentation

## 2020-09-11 DIAGNOSIS — J9601 Acute respiratory failure with hypoxia: Secondary | ICD-10-CM

## 2020-09-11 DIAGNOSIS — I129 Hypertensive chronic kidney disease with stage 1 through stage 4 chronic kidney disease, or unspecified chronic kidney disease: Secondary | ICD-10-CM

## 2020-09-14 DIAGNOSIS — I2699 Other pulmonary embolism without acute cor pulmonale: Secondary | ICD-10-CM | POA: Diagnosis not present

## 2020-09-14 DIAGNOSIS — J9601 Acute respiratory failure with hypoxia: Secondary | ICD-10-CM | POA: Diagnosis not present

## 2020-09-14 DIAGNOSIS — J1281 Pneumonia due to SARS-associated coronavirus: Secondary | ICD-10-CM | POA: Diagnosis not present

## 2020-09-14 DIAGNOSIS — U071 COVID-19: Secondary | ICD-10-CM | POA: Diagnosis not present

## 2020-09-14 DIAGNOSIS — I129 Hypertensive chronic kidney disease with stage 1 through stage 4 chronic kidney disease, or unspecified chronic kidney disease: Secondary | ICD-10-CM | POA: Diagnosis not present

## 2020-09-14 DIAGNOSIS — N1831 Chronic kidney disease, stage 3a: Secondary | ICD-10-CM | POA: Diagnosis not present

## 2020-09-18 ENCOUNTER — Other Ambulatory Visit: Payer: Medicare Other

## 2020-09-18 ENCOUNTER — Other Ambulatory Visit: Payer: Self-pay

## 2020-09-18 DIAGNOSIS — U071 COVID-19: Secondary | ICD-10-CM | POA: Diagnosis not present

## 2020-09-18 DIAGNOSIS — J1282 Pneumonia due to coronavirus disease 2019: Secondary | ICD-10-CM

## 2020-09-19 ENCOUNTER — Other Ambulatory Visit: Payer: Self-pay | Admitting: Nurse Practitioner

## 2020-09-19 DIAGNOSIS — R7309 Other abnormal glucose: Secondary | ICD-10-CM

## 2020-09-19 LAB — CBC WITH DIFFERENTIAL/PLATELET
Basophils Absolute: 0 10*3/uL (ref 0.0–0.2)
Basos: 0 %
EOS (ABSOLUTE): 0.2 10*3/uL (ref 0.0–0.4)
Eos: 2 %
Hematocrit: 36.4 % — ABNORMAL LOW (ref 37.5–51.0)
Hemoglobin: 11.8 g/dL — ABNORMAL LOW (ref 13.0–17.7)
Immature Grans (Abs): 0.1 10*3/uL (ref 0.0–0.1)
Immature Granulocytes: 1 %
Lymphocytes Absolute: 1.5 10*3/uL (ref 0.7–3.1)
Lymphs: 21 %
MCH: 30 pg (ref 26.6–33.0)
MCHC: 32.4 g/dL (ref 31.5–35.7)
MCV: 93 fL (ref 79–97)
Monocytes Absolute: 0.6 10*3/uL (ref 0.1–0.9)
Monocytes: 8 %
Neutrophils Absolute: 4.9 10*3/uL (ref 1.4–7.0)
Neutrophils: 68 %
Platelets: 285 10*3/uL (ref 150–450)
RBC: 3.93 x10E6/uL — ABNORMAL LOW (ref 4.14–5.80)
RDW: 14.7 % (ref 11.6–15.4)
WBC: 7.2 10*3/uL (ref 3.4–10.8)

## 2020-09-19 LAB — BASIC METABOLIC PANEL
BUN/Creatinine Ratio: 11 (ref 10–24)
BUN: 14 mg/dL (ref 8–27)
CO2: 23 mmol/L (ref 20–29)
Calcium: 9 mg/dL (ref 8.6–10.2)
Chloride: 104 mmol/L (ref 96–106)
Creatinine, Ser: 1.3 mg/dL — ABNORMAL HIGH (ref 0.76–1.27)
GFR calc Af Amer: 66 mL/min/{1.73_m2} (ref >59)
GFR calc non Af Amer: 57 mL/min/{1.73_m2} — ABNORMAL LOW (ref >59)
Glucose: 120 mg/dL — ABNORMAL HIGH (ref 65–99)
Potassium: 5.1 mmol/L (ref 3.5–5.2)
Sodium: 142 mmol/L (ref 134–144)

## 2020-09-19 NOTE — Progress Notes (Unsigned)
1C 

## 2020-09-20 DIAGNOSIS — N1831 Chronic kidney disease, stage 3a: Secondary | ICD-10-CM | POA: Diagnosis not present

## 2020-09-20 DIAGNOSIS — I129 Hypertensive chronic kidney disease with stage 1 through stage 4 chronic kidney disease, or unspecified chronic kidney disease: Secondary | ICD-10-CM | POA: Diagnosis not present

## 2020-09-20 DIAGNOSIS — I2699 Other pulmonary embolism without acute cor pulmonale: Secondary | ICD-10-CM | POA: Diagnosis not present

## 2020-09-20 DIAGNOSIS — J9601 Acute respiratory failure with hypoxia: Secondary | ICD-10-CM | POA: Diagnosis not present

## 2020-09-20 DIAGNOSIS — J1281 Pneumonia due to SARS-associated coronavirus: Secondary | ICD-10-CM | POA: Diagnosis not present

## 2020-09-20 DIAGNOSIS — U071 COVID-19: Secondary | ICD-10-CM | POA: Diagnosis not present

## 2020-10-03 ENCOUNTER — Ambulatory Visit: Payer: Medicare Other | Admitting: Family Medicine

## 2020-10-08 DIAGNOSIS — S301XXA Contusion of abdominal wall, initial encounter: Secondary | ICD-10-CM | POA: Insufficient documentation

## 2020-10-08 DIAGNOSIS — E785 Hyperlipidemia, unspecified: Secondary | ICD-10-CM | POA: Insufficient documentation

## 2020-10-08 NOTE — Progress Notes (Signed)
Cardiology Office Note   Date:  10/10/2020   ID:  Nicholas Palmer, DOB [birth year], MRN [id number]  PCP:  Dettinger, Elige Radon, MD  Cardiologist:   No primary care provider on file.   Chief Complaint  Patient presents with  . Shortness of Breath      History of Present Illness: Nicholas Palmer is a 65 y.o. male who presents for follow up of hypertension and his aortic dissection. 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  His most recent CT to when he had pneumonia late last year demonstrated a stable dissection and a positive PE.  He was treated with Eliquis but rectus sheath hematoma.  His Eliquis was stopped.  I reviewed these records for this visit.      He said that since he is gone home he has felt okay.  He has 1.5 L of oxygen that he uses as needed but he says he is really not that breathless.  He does not take his oxygen saturations.  He takes his blood pressure he says one time it was high but typically is well controlled.  He is not having any new chest discomfort, neck or arm discomfort.  He is not having palpitations, presyncope or syncope.  He rarely has some discomfort in his right upper quadrant in his abdomen but he does not report any change to his bowels.   Past Medical History:  Diagnosis Date  . Aortic dissection [facility]) June 10, 2010   2011 status post repair of type  aortic dissection and aortic valve resuspension  . Decreased left ventricular function June 10, 2010   EF of 40-45% w/ out regional wall motion abnormalities 2011  EF 60%echo 2014  . Hypertension   . Nephrolithiasis    has history; status post stone removal  . Tobacco abuse     Past Surgical History:  Procedure Laterality Date  . 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  06/10/10 Dr. Tyrone Sage     Current Outpatient Medications  Medication Sig Dispense Refill  . acetaminophen (TYLENOL)  325 MG tablet Take 2 tablets (650 mg total) by mouth every 6 (six) hours as needed for mild pain (or Fever >/= 101).    Marland Kitchen aspirin 81 MG tablet Take 1 tablet (81 mg total) by mouth daily.    . metoprolol tartrate (LOPRESSOR) 100 MG tablet TAKE 1 TABLET (100 MG TOTAL) BY MOUTH 2 (TWO) TIMES DAILY. IN ADDITION TO 25MG  TWICE DAILY 180 tablet 3  . metoprolol tartrate (LOPRESSOR) 25 MG tablet TAKE 1 TABLET BY MOUTH 2 TIMES DAILY. TAKES IN ADDITION TO THE 100MG  TWICE A DAY. 180 tablet 3  . simvastatin (ZOCOR) 20 MG tablet TAKE 1 TABLET BY MOUTH EVERYDAY AT BEDTIME 90 tablet 3  . albuterol (VENTOLIN HFA) 108 (90 Base) MCG/ACT inhaler Inhale 2 puffs into the lungs every 4 (four) hours as needed for wheezing or shortness of breath. (Patient not taking: No sig reported) 18 g 0   No current facility-administered medications for this visit.    Allergies:   Codeine and Quinolones    ROS:  Please see the history of present illness.   Otherwise, review of systems are positive for none.   All other systems are reviewed and negative.    PHYSICAL EXAM: VS:  BP 130/78   Pulse 84   Ht 5\' 9"  (1.753 m)   Wt  193 lb (87.5 kg)   BMI 28.50 kg/m  , BMI Body mass index is 28.5 kg/m. GENERAL:  Well appearing NECK:  No jugular venous distention, waveform within normal limits, carotid upstroke brisk and symmetric, no bruits, no thyromegaly LUNGS:  Clear to auscultation bilaterally CHEST:  Unremarkable HEART:  PMI not displaced or sustained,S1 and S2 within normal limits, no S3, no S4, no clicks, no rubs, no murmurs ABD:  Flat, positive bowel sounds normal in frequency in pitch, no bruits, no rebound, no guarding, no midline pulsatile mass, no hepatomegaly, no splenomegaly EXT:  2 plus pulses throughout, no edema, no cyanosis no clubbing   EKG:  EKG is not ordered today.    Recent Labs: 08/13/2020: ALT 65 08/15/2020: Magnesium 2.2 09/18/2020: BUN 14; Creatinine, Ser 1.30; Hemoglobin 11.8; Platelets 285; Potassium  5.1; Sodium 142    Lipid Panel    Component Value Date/Time   CHOL 125 12/23/2017 1004   TRIG 108 08/08/2020 0332   HDL 33 (L) 12/23/2017 1004   CHOLHDL 3.8 12/23/2017 1004   CHOLHDL 3.4 06/14/2010 0529   VLDL 18 06/14/2010 0529   LDLCALC 72 12/23/2017 1004      Wt Readings from Last 3 Encounters:  10/10/20 193 lb (87.5 kg)  09/05/20 188 lb 9.6 oz (85.5 kg)  08/08/20 198 lb (89.8 kg)      Other studies Reviewed: Additional studies/ records that were reviewed today include: Hospital records Review of the above records demonstrates:  Please see elsewhere in the note.     ASSESSMENT AND PLAN:  AORTIC DISSECTION REPAIRED:    He had stable repair late last year.  Reviewed the CT results.  Consider further imaging again later this year.  Until then we will continue with blood pressure management.  No change in therapy.   HTN:  His BP is at target today but I would have him keep a blood pressure diary as he said it was up once.  They discontinued his lisinopril in the hospital and we may need to resume this.   RECTUS SHEATH HEMATOMA: His hemoglobin is up from the low in the hospital of 7.1.  No change in therapy.  DYSLIPIDEMIA:  I would like him to come back for fasting lipid profile.  CKD II:  Creat was 1.3 which was down from his other baseline.  No change in therapy.  I think he would tolerate his ACE inhibitor if he needed it as the highest creatinine I see was 1.59.     Current medicines are reviewed at length with the patient today.  The patient does not have concerns regarding medicines.  The following changes have been made:  None  Labs/ tests ordered today include: None  Orders Placed This Encounter  Procedures  . Lipid panel     Disposition:   FU with me in 6 months  Signed, Rollene Rotunda, MD  10/10/2020 12:03 PM    [facility]

## 2020-10-10 ENCOUNTER — Ambulatory Visit (INDEPENDENT_AMBULATORY_CARE_PROVIDER_SITE_OTHER): Payer: Medicare Other | Admitting: Cardiology

## 2020-10-10 ENCOUNTER — Other Ambulatory Visit: Payer: Self-pay

## 2020-10-10 ENCOUNTER — Encounter: Payer: Self-pay | Admitting: Cardiology

## 2020-10-10 VITALS — BP 130/78 | HR 84 | Ht 69.0 in | Wt 193.0 lb

## 2020-10-10 DIAGNOSIS — E785 Hyperlipidemia, unspecified: Secondary | ICD-10-CM

## 2020-10-10 DIAGNOSIS — I7101 Dissection of thoracic aorta: Secondary | ICD-10-CM

## 2020-10-10 DIAGNOSIS — S301XXS Contusion of abdominal wall, sequela: Secondary | ICD-10-CM

## 2020-10-10 DIAGNOSIS — I10 Essential (primary) hypertension: Secondary | ICD-10-CM | POA: Diagnosis not present

## 2020-10-10 DIAGNOSIS — I71019 Dissection of thoracic aorta, unspecified: Secondary | ICD-10-CM

## 2020-10-10 NOTE — Patient Instructions (Addendum)
Medication Instructions:  The current medical regimen is effective;  continue present plan and medications.  *If you need a refill on your cardiac medications before your next appointment, please call your pharmacy*  Please have blood work at your primary care doctor's office. (Lipid)  Follow-Up: At [facility], you and your health needs are our priority.  As part of our continuing mission to provide you with exceptional heart care, we have created designated Provider Care Teams.  These Care Teams include your primary Cardiologist (physician) and Advanced Practice Providers (APPs -  Physician Assistants and Nurse Practitioners) who all work together to provide you with the care you need, when you need it.  We recommend signing up for the patient portal called "MyChart".  Sign up information is provided on this After Visit Summary.  MyChart is used to connect with patients for Virtual Visits (Telemedicine).  Patients are able to view lab/test results, encounter notes, upcoming appointments, etc.  Non-urgent messages can be sent to your provider as well.   To learn more about what you can do with MyChart, go to [email or web address].    Your next appointment:   6 month(s)  The format for your next appointment:   In Person  Provider:   Rollene Rotunda, MD   Thank you for choosing [facility]!!

## 2020-10-24 ENCOUNTER — Other Ambulatory Visit: Payer: Medicare Other

## 2020-10-25 ENCOUNTER — Other Ambulatory Visit: Payer: Self-pay

## 2020-10-25 ENCOUNTER — Other Ambulatory Visit: Payer: Medicare Other

## 2020-10-25 ENCOUNTER — Ambulatory Visit: Payer: Medicare Other | Admitting: Family Medicine

## 2020-10-25 ENCOUNTER — Ambulatory Visit (INDEPENDENT_AMBULATORY_CARE_PROVIDER_SITE_OTHER): Payer: Medicare Other | Admitting: Family Medicine

## 2020-10-25 ENCOUNTER — Encounter: Payer: Self-pay | Admitting: Family Medicine

## 2020-10-25 VITALS — BP 118/67 | HR 75 | Ht 69.0 in | Wt 197.0 lb

## 2020-10-25 DIAGNOSIS — J1282 Pneumonia due to coronavirus disease 2019: Secondary | ICD-10-CM | POA: Diagnosis not present

## 2020-10-25 DIAGNOSIS — E785 Hyperlipidemia, unspecified: Secondary | ICD-10-CM | POA: Diagnosis not present

## 2020-10-25 DIAGNOSIS — U071 COVID-19: Secondary | ICD-10-CM

## 2020-10-25 NOTE — Progress Notes (Signed)
BP 118/67   Pulse 75   Ht 5\' 9"  (1.753 m)   Wt 197 lb (89.4 kg)   SpO2 100%   BMI 29.09 kg/m    Subjective:   Patient ID: Nicholas Palmer, male    DOB: 01/18/1956, 65 y.o.   MRN: [id number]  HPI: Nicholas Palmer is a 65 y.o. male presenting on 10/25/2020 for covid pneumonia follow up (Some SOB still. O2 at 1.5L qhs. Denies chest pain. Hochrein requests labs today)   HPI Patient is coming in for Covid pneumonia follow-up.  He has been using still the 1.5 L of oxygen at night but has not required any during the day.  He still gets an occasional exertional shortness of breath but he says is about where he was before and that has not changed.  He feels like he is back to his baseline.  His only other complaint is that he did lose some hair with this illness and he feels like it still kind of coming out in clumps at times.  He says he has been bald but he is more bald than what he was before.  Relevant past medical, surgical, family and social history reviewed and updated as indicated. Interim medical history since our last visit reviewed. Allergies and medications reviewed and updated.  Review of Systems  Constitutional: Negative for chills and fever.  HENT: Negative for congestion.   Respiratory: Positive for shortness of breath. Negative for cough and wheezing.   Cardiovascular: Negative for chest pain and leg swelling.  All other systems reviewed and are negative.   Per HPI unless specifically indicated above   Allergies as of 10/25/2020      Reactions   Codeine Nausea And Vomiting   Quinolones    Patient was warned about not using Cipro and similar antibiotics. Recent studies have raised concern that fluoroquinolone antibiotics could be associated with an increased risk of aortic aneurysm Fluoroquinolones have non-antimicrobial properties that might jeopardise the integrity of the extracellular matrix of the vascular wall In a  propensity score matched cohort study in 10/27/2020,  there was a 66% increased rate of aortic aneurysm or dissection associated with oral fluoroquinolone use, compared wit      Medication List       Accurate as of October 25, 2020  2:30 PM. If you have any questions, ask your nurse or doctor.        STOP taking these medications   acetaminophen 325 MG tablet Commonly known as: TYLENOL Stopped by: October 27, 2020, MD   albuterol 108 (90 Base) MCG/ACT inhaler Commonly known as: VENTOLIN HFA Stopped by: Nils Pyle Dettinger, MD     TAKE these medications   aspirin 81 MG tablet Take 1 tablet (81 mg total) by mouth daily.   metoprolol tartrate 25 MG tablet Commonly known as: LOPRESSOR TAKE 1 TABLET BY MOUTH 2 TIMES DAILY. TAKES IN ADDITION TO THE 100MG  TWICE A DAY.   metoprolol tartrate 100 MG tablet Commonly known as: LOPRESSOR TAKE 1 TABLET (100 MG TOTAL) BY MOUTH 2 (TWO) TIMES DAILY. IN ADDITION TO 25MG  TWICE DAILY   simvastatin 20 MG tablet Commonly known as: ZOCOR TAKE 1 TABLET BY MOUTH EVERYDAY AT BEDTIME        Objective:   BP 118/67   Pulse 75   Ht 5\' 9"  (1.753 m)   Wt 197 lb (89.4 kg)   SpO2 100%   BMI 29.09 kg/m   Wt Readings from Last 3 Encounters:  10/25/20 197 lb (89.4 kg)  10/10/20 193 lb (87.5 kg)  09/05/20 188 lb 9.6 oz (85.5 kg)    Physical Exam Vitals and nursing note reviewed.  Constitutional:      General: He is not in acute distress.    Appearance: He is well-developed. He is not diaphoretic.  Eyes:     General: No scleral icterus.    Conjunctiva/sclera: Conjunctivae normal.  Neck:     Thyroid: No thyromegaly.  Cardiovascular:     Rate and Rhythm: Normal rate and regular rhythm.     Heart sounds: Normal heart sounds. No murmur heard.   Pulmonary:     Effort: Pulmonary effort is normal. No respiratory distress.     Breath sounds: Normal breath sounds. No stridor. No wheezing or rhonchi.  Chest:     Chest wall: No tenderness.  Musculoskeletal:     Cervical back: Neck supple.   Lymphadenopathy:     Cervical: No cervical adenopathy.  Skin:    General: Skin is warm and dry.     Findings: No rash.  Neurological:     Mental Status: He is alert and oriented to person, place, and time.     Coordination: Coordination normal.  Psychiatric:        Behavior: Behavior normal.       Assessment & Plan:   Problem List Items Addressed This Visit      Respiratory   Pneumonia due to COVID-19 virus - Primary      Continue with 1.5 L nasal cannula at nighttime.  No other change in medication.  Seems to be doing well, follow-up for his normal usual appointment. Follow up plan: Return in about 6 months (around 04/27/2021), or if symptoms worsen or fail to improve, for Physical exam and hypertension.  Counseling provided for all of the vaccine components No orders of the defined types were placed in this encounter.   Arville Care, MD [facility] 10/25/2020, 2:30 PM

## 2020-10-26 LAB — LIPID PANEL
Chol/HDL Ratio: 3.2 ratio (ref 0.0–5.0)
Cholesterol, Total: 139 mg/dL (ref 100–199)
HDL: 44 mg/dL (ref >39)
LDL Chol Calc (NIH): 77 mg/dL (ref 0–99)
Triglycerides: 98 mg/dL (ref 0–149)
VLDL Cholesterol Cal: 18 mg/dL (ref 5–40)

## 2021-04-24 ENCOUNTER — Ambulatory Visit: Payer: Medicare Other | Admitting: Cardiology

## 2021-04-25 ENCOUNTER — Ambulatory Visit: Payer: Medicare Other | Admitting: Family Medicine

## 2021-04-28 NOTE — Progress Notes (Signed)
Cardiology Office Note   Date:  05/01/2021   ID:  Nicholas Palmer, DOB [birth year], MRN [id number]  PCP:  Dettinger, Elige Radon, MD  Cardiologist:   None   Chief Complaint  Patient presents with   Thoracic Aortic Dissection       History of Present Illness: Nicholas Palmer is a 65 y.o. male who presents for follow up of hypertension and his aortic dissection. 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  His most recent CT to when he had pneumonia late last 2021 demonstrated a stable dissection and a positive PE.  He was treated with Eliquis but rectus sheath hematoma.  His Eliquis was stopped.    He has done well.  He stays active. The patient denies any new symptoms such as chest discomfort, neck or arm discomfort. There has been no new shortness of breath, PND or orthopnea. There have been no reported palpitations, presyncope or syncope.     Past Medical History:  Diagnosis Date   Aortic dissection [facility] June 10, 2010   2011 status post repair of type  aortic dissection and aortic valve resuspension   Decreased left ventricular function June 10, 2010   EF of 40-45% w/ out regional wall motion abnormalities 2011  EF 60%echo 2014   Hypertension    Nephrolithiasis    has history; status post stone removal   Tobacco abuse     Past Surgical History:  Procedure Laterality Date   REPAIR OF ACUTE ASCENDING THORACIC AORTIC DISSECTION     Aortic root replacement with resuspension of the aortic valve.  06/10/10 Dr. Tyrone Sage     Current Outpatient Medications  Medication Sig Dispense Refill   aspirin 81 MG tablet Take 1 tablet (81 mg total) by mouth daily.     metoprolol tartrate (LOPRESSOR) 100 MG tablet Take 1 tablet (100 mg total) by mouth 2 (two) times daily. In addition to 25mg  twice daily 180 tablet 3   metoprolol tartrate (LOPRESSOR) 25 MG tablet TAKE 1 TABLET BY MOUTH 2 TIMES DAILY. TAKES IN ADDITION TO THE 100MG  TWICE A DAY. 180 tablet 3    simvastatin (ZOCOR) 20 MG tablet TAKE 1 TABLET BY MOUTH EVERYDAY AT BEDTIME 90 tablet 3   No current facility-administered medications for this visit.    Allergies:   Codeine and Quinolones    ROS:  Please see the history of present illness.   Otherwise, review of systems are positive for none.   All other systems are reviewed and negative.    PHYSICAL EXAM: VS:  BP 122/66   Pulse 80   Ht 5\' 9"  (1.753 m)   Wt 208 lb (94.3 kg)   BMI 30.72 kg/m  , BMI Body mass index is 30.72 kg/m. GENERAL:  Well appearing NECK:  No jugular venous distention, waveform within normal limits, carotid upstroke brisk and symmetric, no bruits, no thyromegaly LUNGS:  Clear to auscultation bilaterally CHEST:  Well healed sternotomy scar. HEART:  PMI not displaced or sustained,S1 and S2 within normal limits, no S3, no S4, no clicks, no rubs, no murmurs ABD:  Flat, positive bowel sounds normal in frequency in pitch, no bruits, no rebound, no guarding, no midline pulsatile mass, no hepatomegaly, no splenomegaly EXT:  2 plus pulses throughout, no edema, no cyanosis no clubbing    EKG:  EKG is not ordered today.    Recent Labs: 08/13/2020: ALT 65 08/15/2020: Magnesium 2.2 09/18/2020: BUN 14; Creatinine, Ser 1.30; Hemoglobin 11.8; Platelets  285; Potassium 5.1; Sodium 142    Lipid Panel    Component Value Date/Time   CHOL 139 10/25/2020 1439   TRIG 98 10/25/2020 1439   HDL 44 10/25/2020 1439   CHOLHDL 3.2 10/25/2020 1439   CHOLHDL 3.4 06/14/2010 0529   VLDL 18 06/14/2010 0529   LDLCALC 77 10/25/2020 1439      Wt Readings from Last 3 Encounters:  05/01/21 208 lb (94.3 kg)  10/25/20 197 lb (89.4 kg)  10/10/20 193 lb (87.5 kg)      Other studies Reviewed: Additional studies/ records that were reviewed today include: Labs Review of the above records demonstrates:  Please see elsewhere in the note.     ASSESSMENT AND PLAN:  AORTIC DISSECTION REPAIRED:       He had stable repair late last year.   I will repeat this in December.    HTN: His blood pressure is elevated today but I am not quite sure that his wrist cuff is accurate.  We are going to send an arm cuff.  His blood pressure in both arms is equal.  It was actually 128/78 when we repeated it.   RECTUS SHEATH HEMATOMA:   His hemoglobin rebounded and has had no recurrent pain.   DYSLIPIDEMIA: He has had normal lipids.  No change in therapy.  CKD II:  Creat was 1.3.  No change in therapy.  This was earlier this year.    Current medicines are reviewed at length with the patient today.  The patient does not have concerns regarding medicines.  The following changes have been made:  None  Labs/ tests ordered today include:    Orders Placed This Encounter  Procedures   CT ANGIO CHEST AORTA W &/OR WO CONTRAST   Basic metabolic panel      Disposition:   FU with me in 12 months  Signed, Rollene Rotunda, MD  05/01/2021 10:30 AM    [facility]

## 2021-05-01 ENCOUNTER — Telehealth (HOSPITAL_COMMUNITY): Payer: Self-pay | Admitting: Licensed Clinical Social Worker

## 2021-05-01 ENCOUNTER — Ambulatory Visit (INDEPENDENT_AMBULATORY_CARE_PROVIDER_SITE_OTHER): Payer: Medicare Other | Admitting: Cardiology

## 2021-05-01 ENCOUNTER — Other Ambulatory Visit: Payer: Self-pay

## 2021-05-01 ENCOUNTER — Encounter: Payer: Self-pay | Admitting: Cardiology

## 2021-05-01 VITALS — BP 122/66 | HR 80 | Ht 69.0 in | Wt 208.0 lb

## 2021-05-01 DIAGNOSIS — Z9889 Other specified postprocedural states: Secondary | ICD-10-CM | POA: Diagnosis not present

## 2021-05-01 DIAGNOSIS — I7101 Dissection of thoracic aorta: Secondary | ICD-10-CM

## 2021-05-01 DIAGNOSIS — N182 Chronic kidney disease, stage 2 (mild): Secondary | ICD-10-CM

## 2021-05-01 DIAGNOSIS — Z8679 Personal history of other diseases of the circulatory system: Secondary | ICD-10-CM

## 2021-05-01 DIAGNOSIS — Z01812 Encounter for preprocedural laboratory examination: Secondary | ICD-10-CM

## 2021-05-01 DIAGNOSIS — E785 Hyperlipidemia, unspecified: Secondary | ICD-10-CM

## 2021-05-01 DIAGNOSIS — I71019 Dissection of thoracic aorta, unspecified: Secondary | ICD-10-CM

## 2021-05-01 MED ORDER — METOPROLOL TARTRATE 100 MG PO TABS: 100.0000 mg | ORAL_TABLET | Freq: Two times a day (BID) | ORAL | 3 refills | Status: DC

## 2021-05-01 MED ORDER — METOPROLOL TARTRATE 25 MG PO TABS: ORAL_TABLET | ORAL | 3 refills | Status: DC

## 2021-05-01 MED ORDER — SIMVASTATIN 20 MG PO TABS: ORAL_TABLET | ORAL | 3 refills | Status: DC

## 2021-05-01 NOTE — Patient Instructions (Signed)
Medication Instructions:  The current medical regimen is effective;  continue present plan and medications.  *If you need a refill on your cardiac medications before your next appointment, please call your pharmacy*  Lab Work: You will need blood work at [facility] before your CT scan in December.  (BMP) If you have labs (blood work) drawn today and your tests are completely normal, you will receive your results only by: MyChart Message (if you have MyChart) OR A paper copy in the mail If you have any lab test that is abnormal or we need to change your treatment, we will call you to review the results.  Testing/Procedures: Your physician has ordered for you to have a CT Angiography (CTA), which is a special type of CT scan that uses a computer to produce multi-dimensional views of major blood vessels throughout the body. In CT angiography, a contrast material is injected through an IV to help visualize the blood vessels.  You are due from this in December and will be called to be schedule.  You will need blood work 1 week before.  Follow-Up: At [facility], you and your health needs are our priority.  As part of our continuing mission to provide you with exceptional heart care, we have created designated Provider Care Teams.  These Care Teams include your primary Cardiologist (physician) and Advanced Practice Providers (APPs -  Physician Assistants and Nurse Practitioners) who all work together to provide you with the care you need, when you need it.  We recommend signing up for the patient portal called "MyChart".  Sign up information is provided on this After Visit Summary.  MyChart is used to connect with patients for Virtual Visits (Telemedicine).  Patients are able to view lab/test results, encounter notes, upcoming appointments, etc.  Non-urgent messages can be sent to your provider as well.   To learn more about what you can do with MyChart, go to [email or web address].    Your next  appointment:   1 year(s)  The format for your next appointment:   In Person  Provider:   Rollene Rotunda, MD  Thank you for choosing [facility]!!

## 2021-05-01 NOTE — Telephone Encounter (Signed)
CSW referred to assist patient with obtaining a BP cuff. CSW contacted patient to inform cuff will be delivered to home. Message left. CSW available as needed. Jackie Kadrian Partch, LCSW, CCSW-MCS [phone]  

## 2021-05-03 ENCOUNTER — Ambulatory Visit: Payer: Medicare Other

## 2021-05-13 ENCOUNTER — Encounter: Payer: Self-pay | Admitting: Family Medicine

## 2021-05-13 ENCOUNTER — Ambulatory Visit: Payer: Medicare Other | Admitting: Family Medicine

## 2021-05-21 ENCOUNTER — Ambulatory Visit (INDEPENDENT_AMBULATORY_CARE_PROVIDER_SITE_OTHER): Payer: Medicare Other | Admitting: Family Medicine

## 2021-05-21 ENCOUNTER — Other Ambulatory Visit: Payer: Self-pay

## 2021-05-21 ENCOUNTER — Encounter: Payer: Self-pay | Admitting: Family Medicine

## 2021-05-21 VITALS — BP 125/67 | HR 59 | Ht 69.0 in | Wt 211.0 lb

## 2021-05-21 DIAGNOSIS — Z125 Encounter for screening for malignant neoplasm of prostate: Secondary | ICD-10-CM | POA: Diagnosis not present

## 2021-05-21 DIAGNOSIS — I10 Essential (primary) hypertension: Secondary | ICD-10-CM | POA: Diagnosis not present

## 2021-05-21 DIAGNOSIS — E7800 Pure hypercholesterolemia, unspecified: Secondary | ICD-10-CM

## 2021-05-21 DIAGNOSIS — N1831 Chronic kidney disease, stage 3a: Secondary | ICD-10-CM

## 2021-05-21 NOTE — Progress Notes (Signed)
BP 125/67   Pulse (!) 59   Ht 5' 9"  (1.753 m)   Wt 211 lb (95.7 kg)   SpO2 94%   BMI 31.16 kg/m    Subjective:   Patient ID: Nicholas Palmer, male    DOB: 1956/06/08, 65 y.o.   MRN: [id number]  HPI: Nicholas Palmer is a 65 y.o. male presenting on 05/21/2021 for Medical Management of Chronic Issues and Hypertension   HPI Hypertension Patient is currently on metoprolol 125 mg daily, and their blood pressure today is 125/67. Patient denies any lightheadedness or dizziness. Patient denies headaches, blurred vision, chest pains, shortness of breath, or weakness. Denies any side effects from medication and is content with current medication.   Hyperlipidemia Patient is coming in for recheck of his hyperlipidemia. The patient is currently taking simvastatin. They deny any issues with myalgias or history of liver damage from it. They deny any focal numbness or weakness or chest pain.   CKD stage III Patient is coming in for CKD stage III recheck.  We will see where his blood work is in levels.  He denies any urinary issues and feels like he is doing pretty well.  Relevant past medical, surgical, family and social history reviewed and updated as indicated. Interim medical history since our last visit reviewed. Allergies and medications reviewed and updated.  Review of Systems  Constitutional:  Negative for chills and fever.  Respiratory:  Negative for shortness of breath and wheezing.   Cardiovascular:  Negative for chest pain and leg swelling.  Musculoskeletal:  Negative for back pain and gait problem.  Skin:  Negative for rash.  Neurological:  Negative for dizziness, weakness and light-headedness.  All other systems reviewed and are negative.  Per HPI unless specifically indicated above   Allergies as of 05/21/2021       Reactions   Codeine Nausea And Vomiting   Quinolones    Patient was warned about not using Cipro and similar antibiotics. Recent studies have raised concern  that fluoroquinolone antibiotics could be associated with an increased risk of aortic aneurysm Fluoroquinolones have non-antimicrobial properties that might jeopardise the integrity of the extracellular matrix of the vascular wall In a  propensity score matched cohort study in [place], there was a 66% increased rate of aortic aneurysm or dissection associated with oral fluoroquinolone use, compared wit        Medication List        Accurate as of May 21, 2021 10:50 AM. If you have any questions, ask your nurse or doctor.          aspirin 81 MG tablet Take 1 tablet (81 mg total) by mouth daily.   metoprolol tartrate 25 MG tablet Commonly known as: LOPRESSOR TAKE 1 TABLET BY MOUTH 2 TIMES DAILY. TAKES IN ADDITION TO THE 100MG TWICE A DAY.   metoprolol tartrate 100 MG tablet Commonly known as: LOPRESSOR Take 1 tablet (100 mg total) by mouth 2 (two) times daily. In addition to 69m twice daily   simvastatin 20 MG tablet Commonly known as: ZOCOR TAKE 1 TABLET BY MOUTH EVERYDAY AT BEDTIME         Objective:   BP 125/67   Pulse (!) 59   Ht 5' 9"  (1.753 m)   Wt 211 lb (95.7 kg)   SpO2 94%   BMI 31.16 kg/m   Wt Readings from Last 3 Encounters:  05/21/21 211 lb (95.7 kg)  05/01/21 208 lb (94.3 kg)  10/25/20 197 lb (  89.4 kg)    Physical Exam Vitals and nursing note reviewed.  Constitutional:      General: He is not in acute distress.    Appearance: He is well-developed. He is not diaphoretic.  Eyes:     General: No scleral icterus.    Conjunctiva/sclera: Conjunctivae normal.  Neck:     Thyroid: No thyromegaly.  Cardiovascular:     Rate and Rhythm: Normal rate and regular rhythm.     Heart sounds: Normal heart sounds. No murmur heard. Pulmonary:     Effort: Pulmonary effort is normal. No respiratory distress.     Breath sounds: Normal breath sounds. No wheezing.  Musculoskeletal:        General: Normal range of motion.     Cervical back: Neck supple.   Lymphadenopathy:     Cervical: No cervical adenopathy.  Skin:    General: Skin is warm and dry.     Findings: No rash.  Neurological:     Mental Status: He is alert and oriented to person, place, and time.     Coordination: Coordination normal.  Psychiatric:        Behavior: Behavior normal.      Assessment & Plan:   Problem List Items Addressed This Visit       Cardiovascular and Mediastinum   Essential hypertension - Primary   Relevant Orders   CMP14+EGFR     Genitourinary   Chronic kidney disease, stage 3a ([place])   Relevant Orders   CBC with Differential/Platelet   CMP14+EGFR     Other   Hypercholesterolemia   Relevant Orders   Lipid panel   Other Visit Diagnoses     Prostate cancer screening       Relevant Orders   PSA, total and free       Continue current medicine, continues see cardiology.  We will check blood work. Follow up plan: Return in about 6 months (around 11/19/2021), or if symptoms worsen or fail to improve, for Hypertension CKD and cholesterol.  Counseling provided for all of the vaccine components Orders Placed This Encounter  Procedures   CBC with Differential/Platelet   CMP14+EGFR   Lipid panel   PSA, total and free    Caryl Pina, MD [facility] 05/21/2021, 10:50 AM

## 2021-05-22 LAB — CBC WITH DIFFERENTIAL/PLATELET
Basophils Absolute: 0 10*3/uL (ref 0.0–0.2)
Basos: 0 %
EOS (ABSOLUTE): 0.1 10*3/uL (ref 0.0–0.4)
Eos: 2 %
Hematocrit: 45 % (ref 37.5–51.0)
Hemoglobin: 15.2 g/dL (ref 13.0–17.7)
Immature Grans (Abs): 0 10*3/uL (ref 0.0–0.1)
Immature Granulocytes: 0 %
Lymphocytes Absolute: 1.7 10*3/uL (ref 0.7–3.1)
Lymphs: 23 %
MCH: 29.6 pg (ref 26.6–33.0)
MCHC: 33.8 g/dL (ref 31.5–35.7)
MCV: 88 fL (ref 79–97)
Monocytes Absolute: 0.6 10*3/uL (ref 0.1–0.9)
Monocytes: 9 %
Neutrophils Absolute: 4.6 10*3/uL (ref 1.4–7.0)
Neutrophils: 66 %
Platelets: 163 10*3/uL (ref 150–450)
RBC: 5.13 x10E6/uL (ref 4.14–5.80)
RDW: 12.2 % (ref 11.6–15.4)
WBC: 7 10*3/uL (ref 3.4–10.8)

## 2021-05-22 LAB — CMP14+EGFR
ALT: 17 IU/L (ref 0–44)
AST: 20 IU/L (ref 0–40)
Albumin/Globulin Ratio: 2 (ref 1.2–2.2)
Albumin: 4.7 g/dL (ref 3.8–4.8)
Alkaline Phosphatase: 46 IU/L (ref 44–121)
BUN/Creatinine Ratio: 11 (ref 10–24)
BUN: 17 mg/dL (ref 8–27)
Bilirubin Total: 0.4 mg/dL (ref 0.0–1.2)
CO2: 23 mmol/L (ref 20–29)
Calcium: 8.9 mg/dL (ref 8.6–10.2)
Chloride: 101 mmol/L (ref 96–106)
Creatinine, Ser: 1.52 mg/dL — ABNORMAL HIGH (ref 0.76–1.27)
Globulin, Total: 2.3 g/dL (ref 1.5–4.5)
Glucose: 103 mg/dL — ABNORMAL HIGH (ref 70–99)
Potassium: 4.4 mmol/L (ref 3.5–5.2)
Sodium: 140 mmol/L (ref 134–144)
Total Protein: 7 g/dL (ref 6.0–8.5)
eGFR: 50 mL/min/{1.73_m2} — ABNORMAL LOW (ref >59)

## 2021-05-22 LAB — LIPID PANEL
Chol/HDL Ratio: 3.5 ratio (ref 0.0–5.0)
Cholesterol, Total: 131 mg/dL (ref 100–199)
HDL: 37 mg/dL — ABNORMAL LOW (ref >39)
LDL Chol Calc (NIH): 74 mg/dL (ref 0–99)
Triglycerides: 108 mg/dL (ref 0–149)
VLDL Cholesterol Cal: 20 mg/dL (ref 5–40)

## 2021-05-22 LAB — PSA, TOTAL AND FREE
PSA, Free Pct: 32.8 %
PSA, Free: 0.82 ng/mL
Prostate Specific Ag, Serum: 2.5 ng/mL (ref 0.0–4.0)

## 2021-06-24 ENCOUNTER — Ambulatory Visit (INDEPENDENT_AMBULATORY_CARE_PROVIDER_SITE_OTHER): Payer: Medicare Other

## 2021-06-24 VITALS — Ht 69.0 in | Wt 213.0 lb

## 2021-06-24 DIAGNOSIS — Z0001 Encounter for general adult medical examination with abnormal findings: Secondary | ICD-10-CM

## 2021-06-24 DIAGNOSIS — Z0000 Encounter for general adult medical examination without abnormal findings: Secondary | ICD-10-CM

## 2021-06-24 DIAGNOSIS — H539 Unspecified visual disturbance: Secondary | ICD-10-CM

## 2021-06-24 DIAGNOSIS — I10 Essential (primary) hypertension: Secondary | ICD-10-CM

## 2021-06-24 NOTE — Progress Notes (Signed)
Subjective:   Nicholas Palmer is a 65 y.o. male who presents for Medicare Annual/Subsequent preventive examination. Virtual Visit via Telephone Note  I connected with  Nicholas Palmer on 06/24/21 at  2:45 PM EST by telephone and verified that I am speaking with the correct person using two identifiers.  Location: Patient: Home Provider: [facility] Persons participating in the virtual visit: patient/Nurse Health Advisor   I discussed the limitations, risks, security and privacy concerns of performing an evaluation and management service by telephone and the availability of in person appointments. The patient expressed understanding and agreed to proceed.  Interactive audio and video telecommunications were attempted between this nurse and patient, however failed, due to patient having technical difficulties OR patient did not have access to video capability.  We continued and completed visit with audio only.  Some vital signs may be absent or patient reported.   Darral Dash, LPN  Review of Systems     Cardiac Risk Factors include: advanced age (>33men, >35 women);male gender;hypertension;dyslipidemia;sedentary lifestyle;smoking/ tobacco exposure     Objective:    Today's Vitals   06/24/21 1438  Weight: 213 lb (96.6 kg)  Height: 5\' 9"  (1.753 m)   Body mass index is 31.45 kg/m.  Advanced Directives 06/24/2021 08/09/2020 08/08/2020  Does Patient Have a Medical Advance Directive? No - No  Would patient like information on creating a medical advance directive? No - Patient declined No - Patient declined -    Current Medications (verified) Outpatient Encounter Medications as of 06/24/2021  Medication Sig   aspirin 81 MG tablet Take 1 tablet (81 mg total) by mouth daily.   metoprolol tartrate (LOPRESSOR) 100 MG tablet Take 1 tablet (100 mg total) by mouth 2 (two) times daily. In addition to 25mg  twice daily   metoprolol tartrate (LOPRESSOR) 25 MG tablet TAKE 1 TABLET BY MOUTH  2 TIMES DAILY. TAKES IN ADDITION TO THE 100MG  TWICE A DAY.   simvastatin (ZOCOR) 20 MG tablet TAKE 1 TABLET BY MOUTH EVERYDAY AT BEDTIME   No facility-administered encounter medications on file as of 06/24/2021.    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  06/10/10 Dr. June 12, 2010   Family History  Problem Relation Age of Onset   Lung disease Mother        copd   Heart disease Father    Heart disease Brother    Kidney disease Sister    Stroke Sister    Social History   Socioeconomic History   Marital status: Legally Separated    Spouse name: Not on file   Number of children: Not on file   Years of education: Not on file   Highest education level: Not on file  Occupational History   Not on file  Tobacco Use   Smoking status: Former    Types: Cigarettes    Quit date: 08/11/2002  Years since quitting: 18.8   Smokeless tobacco: Never  Vaping Use   Vaping Use: Never used  Substance and Sexual Activity   Alcohol use: No    Alcohol/week: 0.0 standard drinks   Drug use: No   Sexual activity: Not Currently    Comment: separated, no kids  Other Topics Concern   Not on file  Social History Narrative   Has 20-pack-year history of tobacco abuse, quitting many years ago. Denies alcohol or drugs. Does not routinely exercise    Social Determinants of Health   Financial Resource Strain: Medium Risk    Difficulty of Paying Living Expenses: Somewhat hard  Food Insecurity: No Food Insecurity   Worried About Programme researcher, broadcasting/film/video in the Last Year: Never true   Ran Out of Food in the Last Year: Never true  Transportation Needs: No Transportation Needs   Lack of Transportation (Medical): No   Lack of Transportation (Non-Medical): No  Physical Activity: Sufficiently Active   Days of Exercise per Week: 5 days   Minutes of Exercise per Session: 30 min  Stress: No Stress Concern Present   Feeling of Stress : Not at all  Social Connections: Moderately Integrated   Frequency of Communication with Friends and Family: More than three times a week   Frequency of Social Gatherings with Friends and Family: Once a week   Attends Religious Services: 1 to 4 times per year   Active Member of [employer] or Organizations: No   Attends Engineer, structural: 1 to 4 times per year   Marital Status: Separated    Tobacco Counseling Counseling given: Not Answered   Clinical Intake:  Pre-visit preparation completed: Yes  Pain : No/denies pain     BMI - recorded: 31.45 Nutritional Risks: None Diabetes: No  How often do you need to have someone help you when you read instructions, pamphlets, or other written materials from your doctor or pharmacy?: 1 - Never  Diabetic?No  Interpreter Needed?: No  Information entered by :: MJ Lateefa Crosby, LPN   Activities of Daily Living In your present state of health, do you have any difficulty performing the following activities: 06/24/2021 08/09/2020  Hearing? N -  Vision? N -  Difficulty concentrating or making decisions? Y -  Comment Memory at time. -  Walking or climbing stairs? N -  Dressing or bathing? N -  Doing errands, shopping? N N  Preparing Food and eating ? N -  Using the Toilet? N -  In the past six months, have you accidently leaked urine? N -  Do you have problems with loss of bowel control? N -  Managing your Medications? N -  Managing  your Finances? N -  Housekeeping or managing your Housekeeping? N -  Some recent data might be hidden    Patient Care Team: Dettinger, Elige Radon, MD as PCP - General (Family Medicine) Rollene Rotunda, MD as Attending Physician (Cardiology)  Indicate any recent Medical Services you may have received from other than [facility] providers in the past year (date may be approximate).     Assessment:   This is a routine wellness examination for Nicholas Palmer.  Hearing/Vision screen Hearing Screening - Comments:: No hearing issues.  Vision Screening - Comments:: Blind in L eye since age 56 years old. Wears glasses. No eye MD, would like referral.  Dietary issues and exercise activities discussed: Current Exercise Habits: Home exercise routine, Type of exercise: walking, Time (Minutes): 30, Frequency (Times/Week): 5, Weekly Exercise (Minutes/Week): 150,  Intensity: Mild, Exercise limited by: cardiac condition(s)   Goals Addressed             This Visit's Progress    Exercise 3x per week (30 min per time)       Continue to exercise and try to stay healthy.     Have 3 meals a day   On track    Eat 3 meals daily that consist of lean proteins fruits and vegetables.        Depression Screen PHQ 2/9 Scores 06/24/2021 05/21/2021 10/25/2020 09/05/2020 12/23/2017 11/24/2017 02/19/2016  PHQ - 2 Score 0 0 0 0 0 0 0    Fall Risk Fall Risk  06/24/2021 05/21/2021 10/25/2020 09/05/2020 12/23/2017  Falls in the past year? 0 0 0 0 No  Number falls in past yr: 0 - - - -  Injury with Fall? 0 - - - -  Risk for fall due to : Impaired vision - - - -  Follow up Falls prevention discussed - - - -    FALL RISK PREVENTION PERTAINING TO THE HOME:  Any stairs in or around the home? No  If so, are there any without handrails? No  Home free of loose throw rugs in walkways, pet beds, electrical cords, etc? Yes  Adequate lighting in your home to reduce risk of falls? Yes   ASSISTIVE DEVICES UTILIZED TO PREVENT  FALLS:  Life alert? Yes  Use of a cane, walker or w/c? No  Grab bars in the bathroom? Yes  Shower chair or bench in shower? Yes  Elevated toilet seat or a handicapped toilet? Yes   TIMED UP AND GO:  Was the test performed? No . Phone visit.    Cognitive Function: MMSE - Mini Mental State Exam 11/24/2017  Orientation to time 5  Orientation to Place 5  Registration 3  Attention/ Calculation 5  Recall 3  Language- name 2 objects 2  Language- repeat 1  Language- follow 3 step command 3  Language- read & follow direction 1  Write a sentence 1  Copy design 1  Total score 30     6CIT Screen 06/24/2021  What Year? 0 points  What month? 0 points  What time? 0 points  Count back from 20 0 points  Months in reverse 0 points  Repeat phrase 0 points  Total Score 0    Immunizations  There is no immunization history on file for this patient.  TDAP status: Due, Education has been provided regarding the importance of this vaccine. Advised may receive this vaccine at local pharmacy or Health Dept. Aware to provide a copy of the vaccination record if obtained from local pharmacy or Health Dept. Verbalized acceptance and understanding.  Flu Vaccine status: Declined, Education has been provided regarding the importance of this vaccine but patient still declined. Advised may receive this vaccine at local pharmacy or Health Dept. Aware to provide a copy of the vaccination record if obtained from local pharmacy or Health Dept. Verbalized acceptance and understanding.  Pneumococcal vaccine status: Declined,  Education has been provided regarding the importance of this vaccine but patient still declined. Advised may receive this vaccine at local pharmacy or Health Dept. Aware to provide a copy of the vaccination record if obtained from local pharmacy or Health Dept. Verbalized acceptance and understanding.   Covid-19 vaccine status: Declined, Education has been provided regarding the importance  of this vaccine but patient still declined. Advised may receive this vaccine at local pharmacy or  Health Dept.or vaccine clinic. Aware to provide a copy of the vaccination record if obtained from local pharmacy or Health Dept. Verbalized acceptance and understanding.  Qualifies for Shingles Vaccine? Yes   Zostavax completed No   Shingrix Completed?: No.    Education has been provided regarding the importance of this vaccine. Patient has been advised to call insurance company to determine out of pocket expense if they have not yet received this vaccine. Advised may also receive vaccine at local pharmacy or Health Dept. Verbalized acceptance and understanding.  Screening Tests Health Maintenance  Topic Date Due   Pneumonia Vaccine 2+ Years old (1 - PCV) Never done   Hepatitis C Screening  10/24/2021 (Originally 10/16/1973)   COLONOSCOPY (Pts 45-24yrs Insurance coverage will need to be confirmed)  10/25/2021 (Originally 10/17/1999)   TETANUS/TDAP  10/25/2021 (Originally 10/16/1973)   COVID-19 Vaccine (1) 10/25/2021 (Originally 04/19/1956)   INFLUENZA VACCINE  11/08/2021 (Originally 03/11/2021)   Zoster Vaccines- Shingrix (1 of 2) 05/26/2022 (Originally 10/16/1974)   HPV VACCINES  Aged Out    Health Maintenance  Health Maintenance Due  Topic Date Due   Pneumonia Vaccine 20+ Years old (1 - PCV) Never done    Colorectal cancer screening: Referral to GI placed PT DECLINED. Pt aware the office will call re: appt.  Lung Cancer Screening: (Low Dose CT Chest recommended if Age 61-80 years, 30 pack-year currently smoking OR have quit w/in 15years.) does not qualify.  QUIT 18 YEARS AGO.  Additional Screening:  Hepatitis C Screening: does qualify; Completed NOT DONE  Vision Screening: Recommended annual ophthalmology exams for early detection of glaucoma and other disorders of the eye. Is the patient up to date with their annual eye exam?  No  Who is the provider or what is the name of the office in  which the patient attends annual eye exams? N/A If pt is not established with a provider, would they like to be referred to a provider to establish care? Yes .   Dental Screening: Recommended annual dental exams for proper oral hygiene  Community Resource Referral / Chronic Care Management: CRR required this visit?  No   CCM required this visit?  No      Plan:     I have personally reviewed and noted the following in the patient's chart:   Medical and social history Use of alcohol, tobacco or illicit drugs  Current medications and supplements including opioid prescriptions. Patient is not currently taking opioid prescriptions. Functional ability and status Nutritional status Physical activity Advanced directives List of other physicians Hospitalizations, surgeries, and ER visits in previous 12 months Vitals Screenings to include cognitive, depression, and falls Referrals and appointments  In addition, I have reviewed and discussed with patient certain preventive protocols, quality metrics, and best practice recommendations. A written personalized care plan for preventive services as well as general preventive health recommendations were provided to patient.     Darral Dash, LPN   [id number]   Nurse Notes: Pt states it has been several years since and eye exam and would like referral. Referral placed. Pt c/o food supply being "tight." I offered CRR referral but pt declined at this time. Encouraged pt to call if he changes mind. Declined all vaccines.

## 2021-06-24 NOTE — Patient Instructions (Signed)
Nicholas Palmer , Thank you for taking time to come for your Medicare Wellness Visit. I appreciate your ongoing commitment to your health goals. Please review the following plan we discussed and let me know if I can assist you in the future.   Screening recommendations/referrals: Colonoscopy: Declined. Recommended yearly ophthalmology/optometry visit for glaucoma screening and checkup Recommended yearly dental visit for hygiene and checkup  Vaccinations: Influenza vaccine: Declined. Pneumococcal vaccine: Declined.  Tdap vaccine: Declined.  Shingles vaccine: Shingrix discussed. Please contact your pharmacy for coverage information.     Covid-19: Declined.   Advanced directives: Advance directive discussed with you today. Even though you declined this today, please call our office should you change your mind, and we can give you the proper paperwork for you to fill out.   Conditions/risks identified: Aim for 30 minutes of exercise or brisk walking each day, drink 6-8 glasses of water and eat lots of fruits and vegetables.   Next appointment: Follow up in one year for your annual wellness visit. 2023.  Preventive Care 14 Years and Older, Male  Preventive care refers to lifestyle choices and visits with your health care provider that can promote health and wellness. What does preventive care include? A yearly physical exam. This is also called an annual well check. Dental exams once or twice a year. Routine eye exams. Ask your health care provider how often you should have your eyes checked. Personal lifestyle choices, including: Daily care of your teeth and gums. Regular physical activity. Eating a healthy diet. Avoiding tobacco and drug use. Limiting alcohol use. Practicing safe sex. Taking low doses of aspirin every day. Taking vitamin and mineral supplements as recommended by your health care provider. What happens during an annual well check? The services and screenings done by  your health care provider during your annual well check will depend on your age, overall health, lifestyle risk factors, and family history of disease. Counseling  Your health care provider may ask you questions about your: Alcohol use. Tobacco use. Drug use. Emotional well-being. Home and relationship well-being. Sexual activity. Eating habits. History of falls. Memory and ability to understand (cognition). Work and work Astronomer. Screening  You may have the following tests or measurements: Height, weight, and BMI. Blood pressure. Lipid and cholesterol levels. These may be checked every 5 years, or more frequently if you are over 64 years old. Skin check. Lung cancer screening. You may have this screening every year starting at age 32 if you have a 30-pack-year history of smoking and currently smoke or have quit within the past 15 years. Fecal occult blood test (FOBT) of the stool. You may have this test every year starting at age 65. Flexible sigmoidoscopy or colonoscopy. You may have a sigmoidoscopy every 5 years or a colonoscopy every 10 years starting at age 71. Prostate cancer screening. Recommendations will vary depending on your family history and other risks. Hepatitis C blood test. Hepatitis B blood test. Sexually transmitted disease (STD) testing. Diabetes screening. This is done by checking your blood sugar (glucose) after you have not eaten for a while (fasting). You may have this done every 1-3 years. Abdominal aortic aneurysm (AAA) screening. You may need this if you are a current or former smoker. Osteoporosis. You may be screened starting at age 12 if you are at high risk. Talk with your health care provider about your test results, treatment options, and if necessary, the need for more tests. Vaccines  Your health care provider may recommend certain  vaccines, such as: Influenza vaccine. This is recommended every year. Tetanus, diphtheria, and acellular pertussis  (Tdap, Td) vaccine. You may need a Td booster every 10 years. Zoster vaccine. You may need this after age 73. Pneumococcal 13-valent conjugate (PCV13) vaccine. One dose is recommended after age 35. Pneumococcal polysaccharide (PPSV23) vaccine. One dose is recommended after age 11. Talk to your health care provider about which screenings and vaccines you need and how often you need them. This information is not intended to replace advice given to you by your health care provider. Make sure you discuss any questions you have with your health care provider. Document Released: 08/24/2015 Document Revised: 04/16/2016 Document Reviewed: 05/29/2015 Elsevier Interactive Patient Education  2017 [employer] Prevention in the Home Falls can cause injuries. They can happen to people of all ages. There are many things you can do to make your home safe and to help prevent falls. What can I do on the outside of my home? Regularly fix the edges of walkways and driveways and fix any cracks. Remove anything that might make you trip as you walk through a door, such as a raised step or threshold. Trim any bushes or trees on the path to your home. Use bright outdoor lighting. Clear any walking paths of anything that might make someone trip, such as rocks or tools. Regularly check to see if handrails are loose or broken. Make sure that both sides of any steps have handrails. Any raised decks and porches should have guardrails on the edges. Have any leaves, snow, or ice cleared regularly. Use sand or salt on walking paths during winter. Clean up any spills in your garage right away. This includes oil or grease spills. What can I do in the bathroom? Use night lights. Install grab bars by the toilet and in the tub and shower. Do not use towel bars as grab bars. Use non-skid mats or decals in the tub or shower. If you need to sit down in the shower, use a plastic, non-slip stool. Keep the floor dry. Clean  up any water that spills on the floor as soon as it happens. Remove soap buildup in the tub or shower regularly. Attach bath mats securely with double-sided non-slip rug tape. Do not have throw rugs and other things on the floor that can make you trip. What can I do in the bedroom? Use night lights. Make sure that you have a light by your bed that is easy to reach. Do not use any sheets or blankets that are too big for your bed. They should not hang down onto the floor. Have a firm chair that has side arms. You can use this for support while you get dressed. Do not have throw rugs and other things on the floor that can make you trip. What can I do in the kitchen? Clean up any spills right away. Avoid walking on wet floors. Keep items that you use a lot in easy-to-reach places. If you need to reach something above you, use a strong step stool that has a grab bar. Keep electrical cords out of the way. Do not use floor polish or wax that makes floors slippery. If you must use wax, use non-skid floor wax. Do not have throw rugs and other things on the floor that can make you trip. What can I do with my stairs? Do not leave any items on the stairs. Make sure that there are handrails on both sides of the stairs  and use them. Fix handrails that are broken or loose. Make sure that handrails are as long as the stairways. Check any carpeting to make sure that it is firmly attached to the stairs. Fix any carpet that is loose or worn. Avoid having throw rugs at the top or bottom of the stairs. If you do have throw rugs, attach them to the floor with carpet tape. Make sure that you have a light switch at the top of the stairs and the bottom of the stairs. If you do not have them, ask someone to add them for you. What else can I do to help prevent falls? Wear shoes that: Do not have high heels. Have rubber bottoms. Are comfortable and fit you well. Are closed at the toe. Do not wear sandals. If you  use a stepladder: Make sure that it is fully opened. Do not climb a closed stepladder. Make sure that both sides of the stepladder are locked into place. Ask someone to hold it for you, if possible. Clearly mark and make sure that you can see: Any grab bars or handrails. First and last steps. Where the edge of each step is. Use tools that help you move around (mobility aids) if they are needed. These include: Canes. Walkers. Scooters. Crutches. Turn on the lights when you go into a dark area. Replace any light bulbs as soon as they burn out. Set up your furniture so you have a clear path. Avoid moving your furniture around. If any of your floors are uneven, fix them. If there are any pets around you, be aware of where they are. Review your medicines with your doctor. Some medicines can make you feel dizzy. This can increase your chance of falling. Ask your doctor what other things that you can do to help prevent falls. This information is not intended to replace advice given to you by your health care provider. Make sure you discuss any questions you have with your health care provider. Document Released: 05/24/2009 Document Revised: 01/03/2016 Document Reviewed: 09/01/2014 Elsevier Interactive Patient Education  2017 [employer].

## 2021-07-19 ENCOUNTER — Other Ambulatory Visit (HOSPITAL_BASED_OUTPATIENT_CLINIC_OR_DEPARTMENT_OTHER): Payer: Medicare Other

## 2021-08-19 ENCOUNTER — Ambulatory Visit (HOSPITAL_BASED_OUTPATIENT_CLINIC_OR_DEPARTMENT_OTHER): Admission: RE | Admit: 2021-08-19 | Payer: Medicare Other | Source: Ambulatory Visit

## 2021-09-17 ENCOUNTER — Ambulatory Visit (HOSPITAL_BASED_OUTPATIENT_CLINIC_OR_DEPARTMENT_OTHER): Admission: RE | Admit: 2021-09-17 | Payer: Medicare Other | Source: Ambulatory Visit

## 2021-09-17 ENCOUNTER — Encounter (HOSPITAL_BASED_OUTPATIENT_CLINIC_OR_DEPARTMENT_OTHER): Payer: Self-pay

## 2021-09-25 ENCOUNTER — Encounter: Payer: Self-pay | Admitting: *Deleted

## 2021-10-11 IMAGING — CT CT ANGIO CHEST
2 of 6 series · 17 of 46 positions shown · IV contrast (Omnipaque or Isovue)
Comparison: Portable chest earlier today.  Chest CTA 06/24/2018.
COMPARISON: Portable chest earlier today.  Chest CTA 06/24/2018.

Addendum:
CLINICAL DATA: 64-year-old male positive FU1VF-0D. Chronic aortic
dissection with prior surgical repair of the ascending aorta.
Confusion and shortness of breath.

EXAM:
CT ANGIOGRAPHY CHEST WITH CONTRAST
TECHNIQUE: Multidetector CT imaging of the chest was performed using the
standard protocol during bolus administration of intravenous
contrast. Multiplanar CT image reconstructions and MIPs were
obtained to evaluate the vascular anatomy.
CONTRAST:  100mL OMNIPAQUE IOHEXOL 350 MG/ML SOLN

[Series 6: pe axial thins · axial · 0.65mm/px · z∈[-276,-20]mm · 14 of 283 slices shown]
[im 13/283  lung]
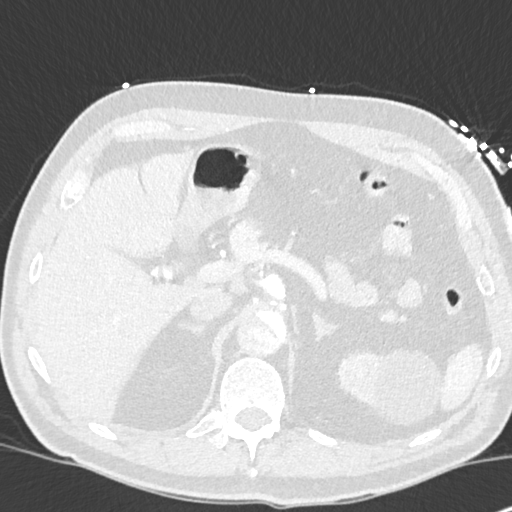
[im 37/283  soft-tissue]
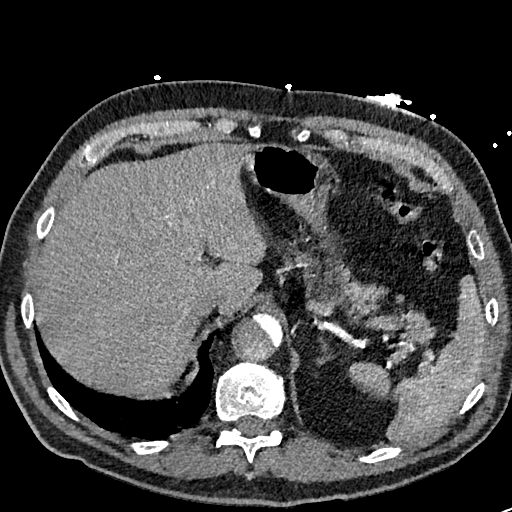
[im 50/283  lung]
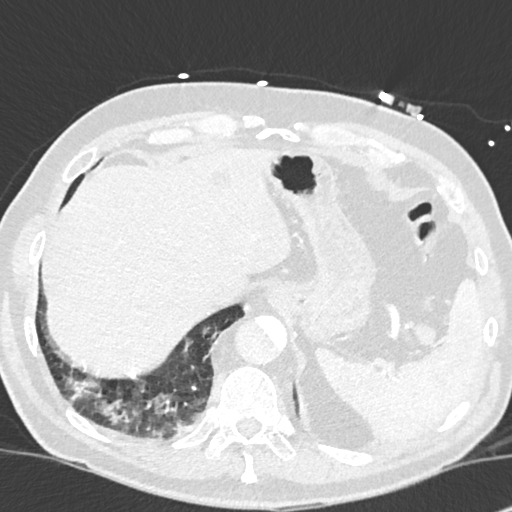
[im 74/283  soft-tissue]
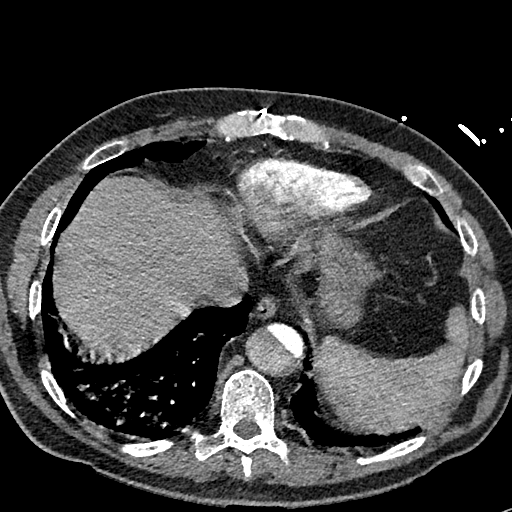
[im 99/283  lung]
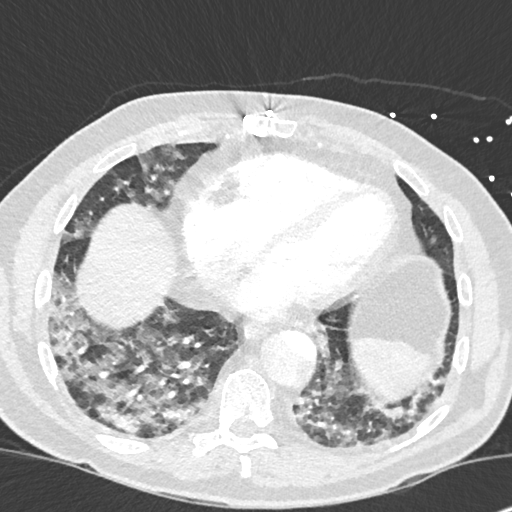
[im 111/283  soft-tissue]
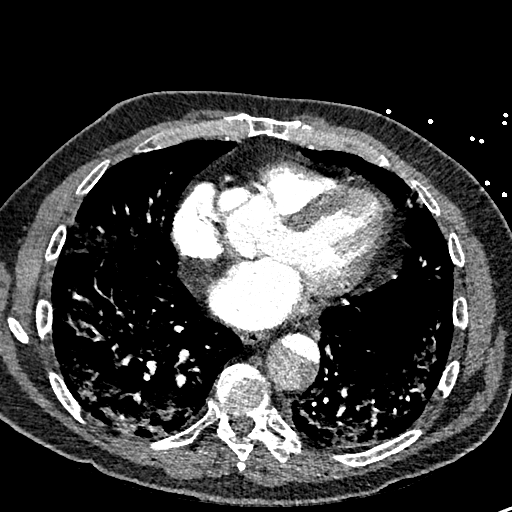
[im 135/283  lung]
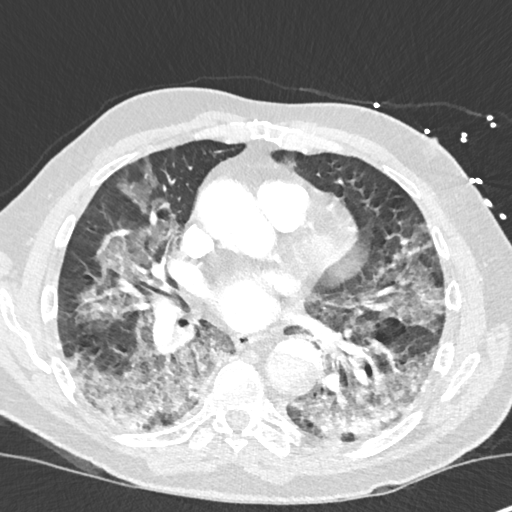
[im 148/283  soft-tissue]
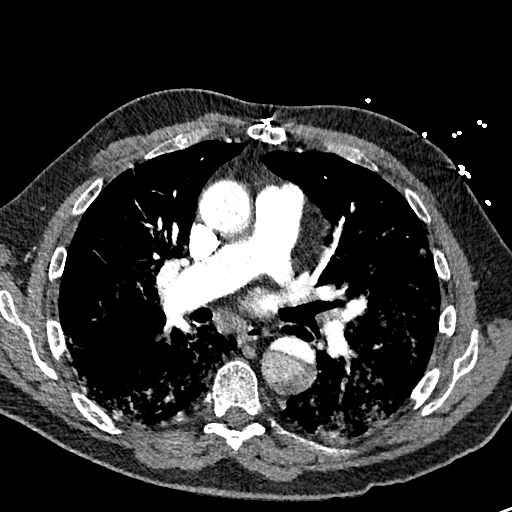
[im 172/283  lung]
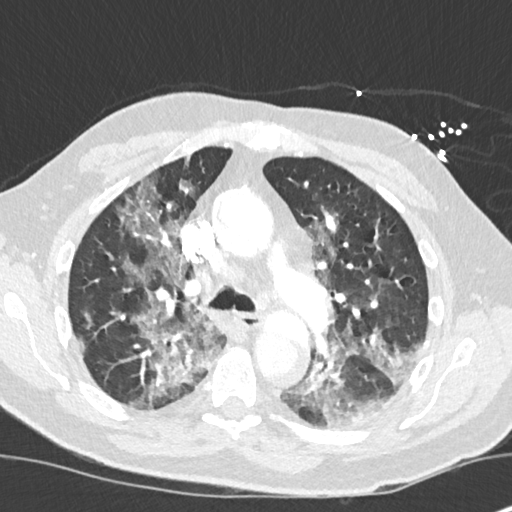
[im 184/283  soft-tissue]
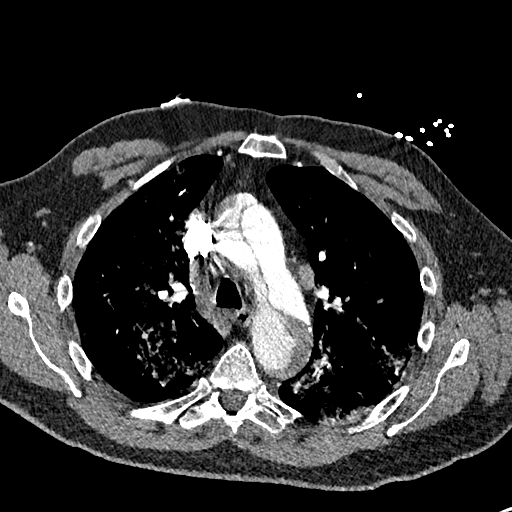
[im 209/283  lung]
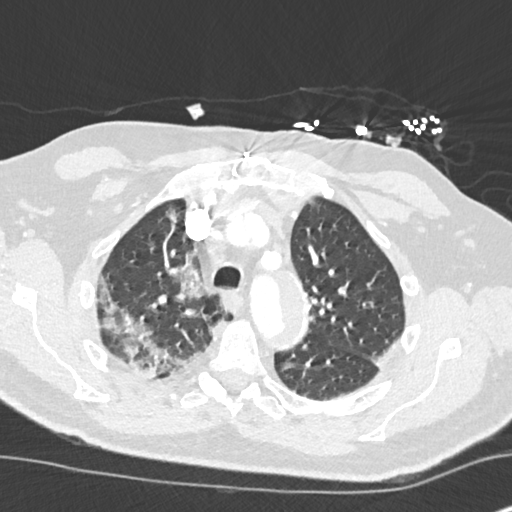
[im 233/283  soft-tissue]
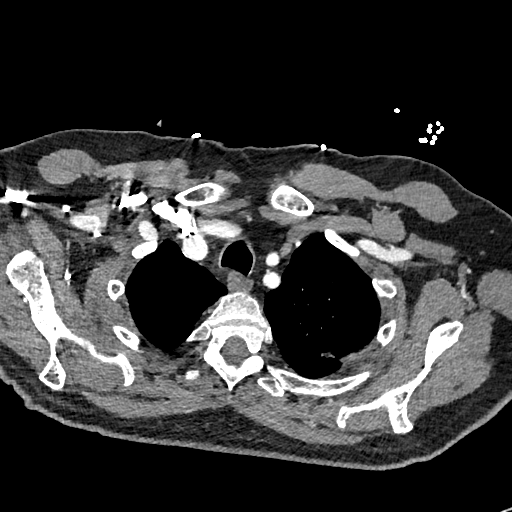
[im 246/283  lung]
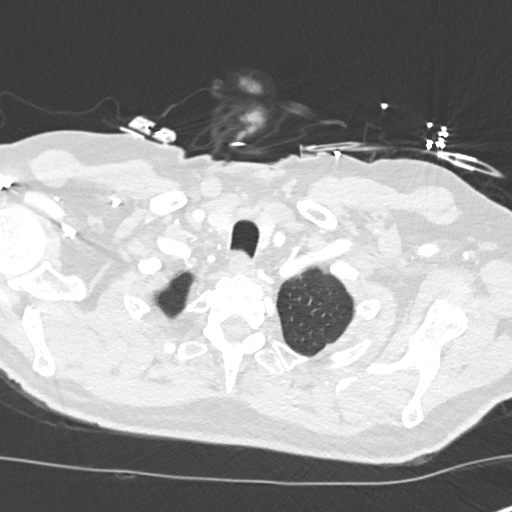
[im 270/283  soft-tissue]
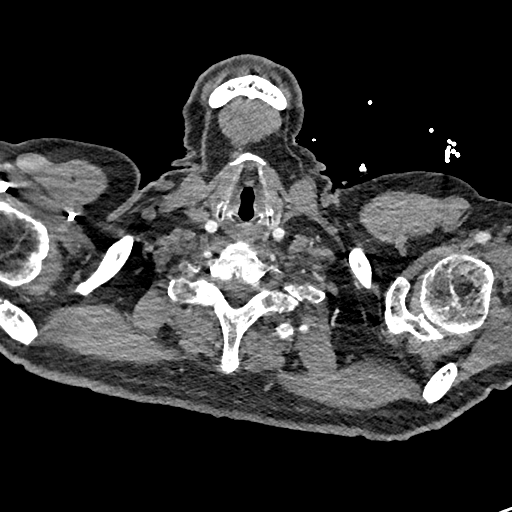

[Series 8: cor soft · coronal · 0.63mm/px · 3 of 151 slices shown]
[im 38/151  soft-tissue]
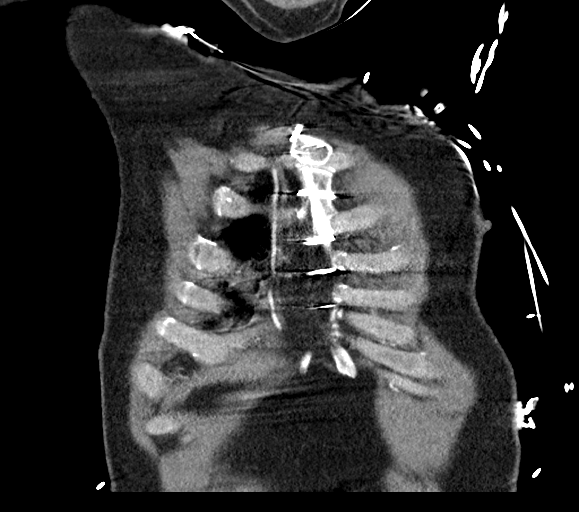
[im 76/151  soft-tissue]
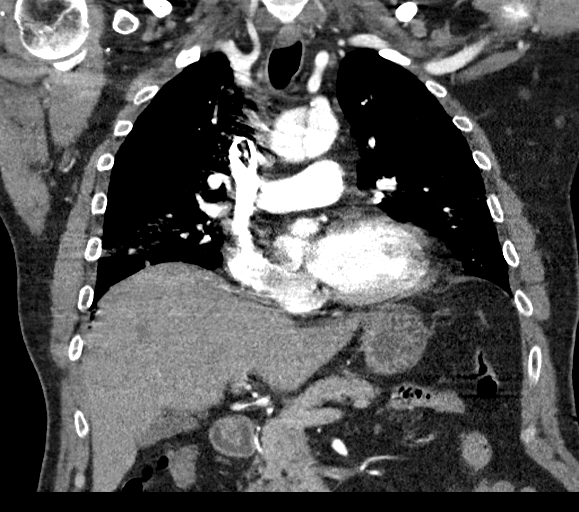
[im 113/151  soft-tissue]
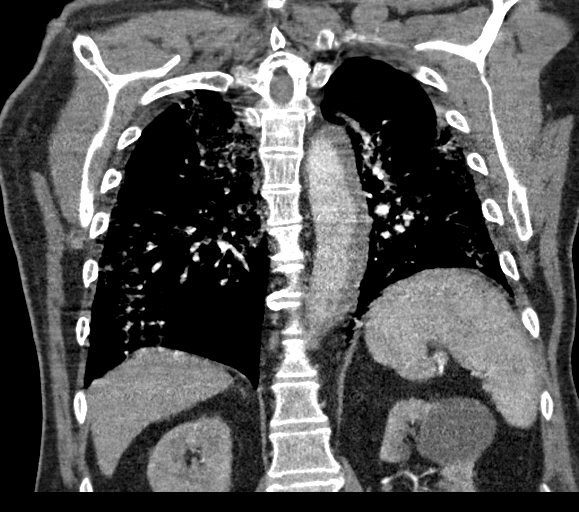

[17 of 46 positions shown; findings below may reference images not displayed]

FINDINGS: Cardiovascular: Good contrast bolus timing in the pulmonary arterial
tree. Respiratory motion artifact. No central or hilar pulmonary
embolus. Subsegmental branches are obscured by motion. Appearance
suspicious for distal branch pulmonary embolus in the lingula on
series 6, image 141. But no other convincing pulmonary arterial
branch occlusion.

Chronic aortic dissection from the brachiocephalic artery extending
distal. Smaller true lumen and larger false lumen as before.

Mild cardiomegaly has increased since 6908. No pericardial effusion.
Calcified coronary artery atherosclerosis.

Mediastinum/Nodes: Small reactive appearing mediastinal lymph nodes
up to 10 mm short axis.

Lungs/Pleura: Extensive bilateral peribronchial, peripheral, and
confluent pulmonary ground-glass opacity typical for FU1VF-0D.
Pneumonia. Major airways remain patent. No pleural effusion or
pneumothorax.

Upper Abdomen: Stable visible upper abdominal viscera including
small benign appearing liver hypodensities and exophytic left upper
pole renal cyst.

Musculoskeletal: Prior sternotomy. No acute osseous abnormality
identified.

Review of the MIP images confirms the above findings.
IMPRESSION: 1. Extensive bilateral FU1VF-0D Pneumonia with a small volume of
Acute Pulmonary Embolus in the lingula.
No central or hilar PE.
2. Reactive mediastinal lymph nodes.
3. Chronic Aortic Dissection is stable since 6908. Mild cardiomegaly
but no pericardial effusion.

ADDENDUM:
Study discussed by telephone with Dr. RANTONA BHEBHE on
08/08/2020 at 5842 hours.

*** End of Addendum ***
FINDINGS: Cardiovascular: Good contrast bolus timing in the pulmonary arterial
tree. Respiratory motion artifact. No central or hilar pulmonary
embolus. Subsegmental branches are obscured by motion. Appearance
suspicious for distal branch pulmonary embolus in the lingula on
series 6, image 141. But no other convincing pulmonary arterial
branch occlusion.

Chronic aortic dissection from the brachiocephalic artery extending
distal. Smaller true lumen and larger false lumen as before.

Mild cardiomegaly has increased since 6908. No pericardial effusion.
Calcified coronary artery atherosclerosis.

Mediastinum/Nodes: Small reactive appearing mediastinal lymph nodes
up to 10 mm short axis.

Lungs/Pleura: Extensive bilateral peribronchial, peripheral, and
confluent pulmonary ground-glass opacity typical for FU1VF-0D.
Pneumonia. Major airways remain patent. No pleural effusion or
pneumothorax.

Upper Abdomen: Stable visible upper abdominal viscera including
small benign appearing liver hypodensities and exophytic left upper
pole renal cyst.

Musculoskeletal: Prior sternotomy. No acute osseous abnormality
identified.

Review of the MIP images confirms the above findings.
IMPRESSION: 1. Extensive bilateral FU1VF-0D Pneumonia with a small volume of
Acute Pulmonary Embolus in the lingula.
No central or hilar PE.
2. Reactive mediastinal lymph nodes.
3. Chronic Aortic Dissection is stable since 6908. Mild cardiomegaly
but no pericardial effusion.

## 2021-10-15 IMAGING — DX DG ABD PORTABLE 2V
2 series · 2 of 2 positions shown · non-contrast
Comparison: CT 04/20/2014

CLINICAL DATA: Left lower quadrant mass, pain

EXAM:
PORTABLE ABDOMEN - 2 VIEW

[abdomen kub]
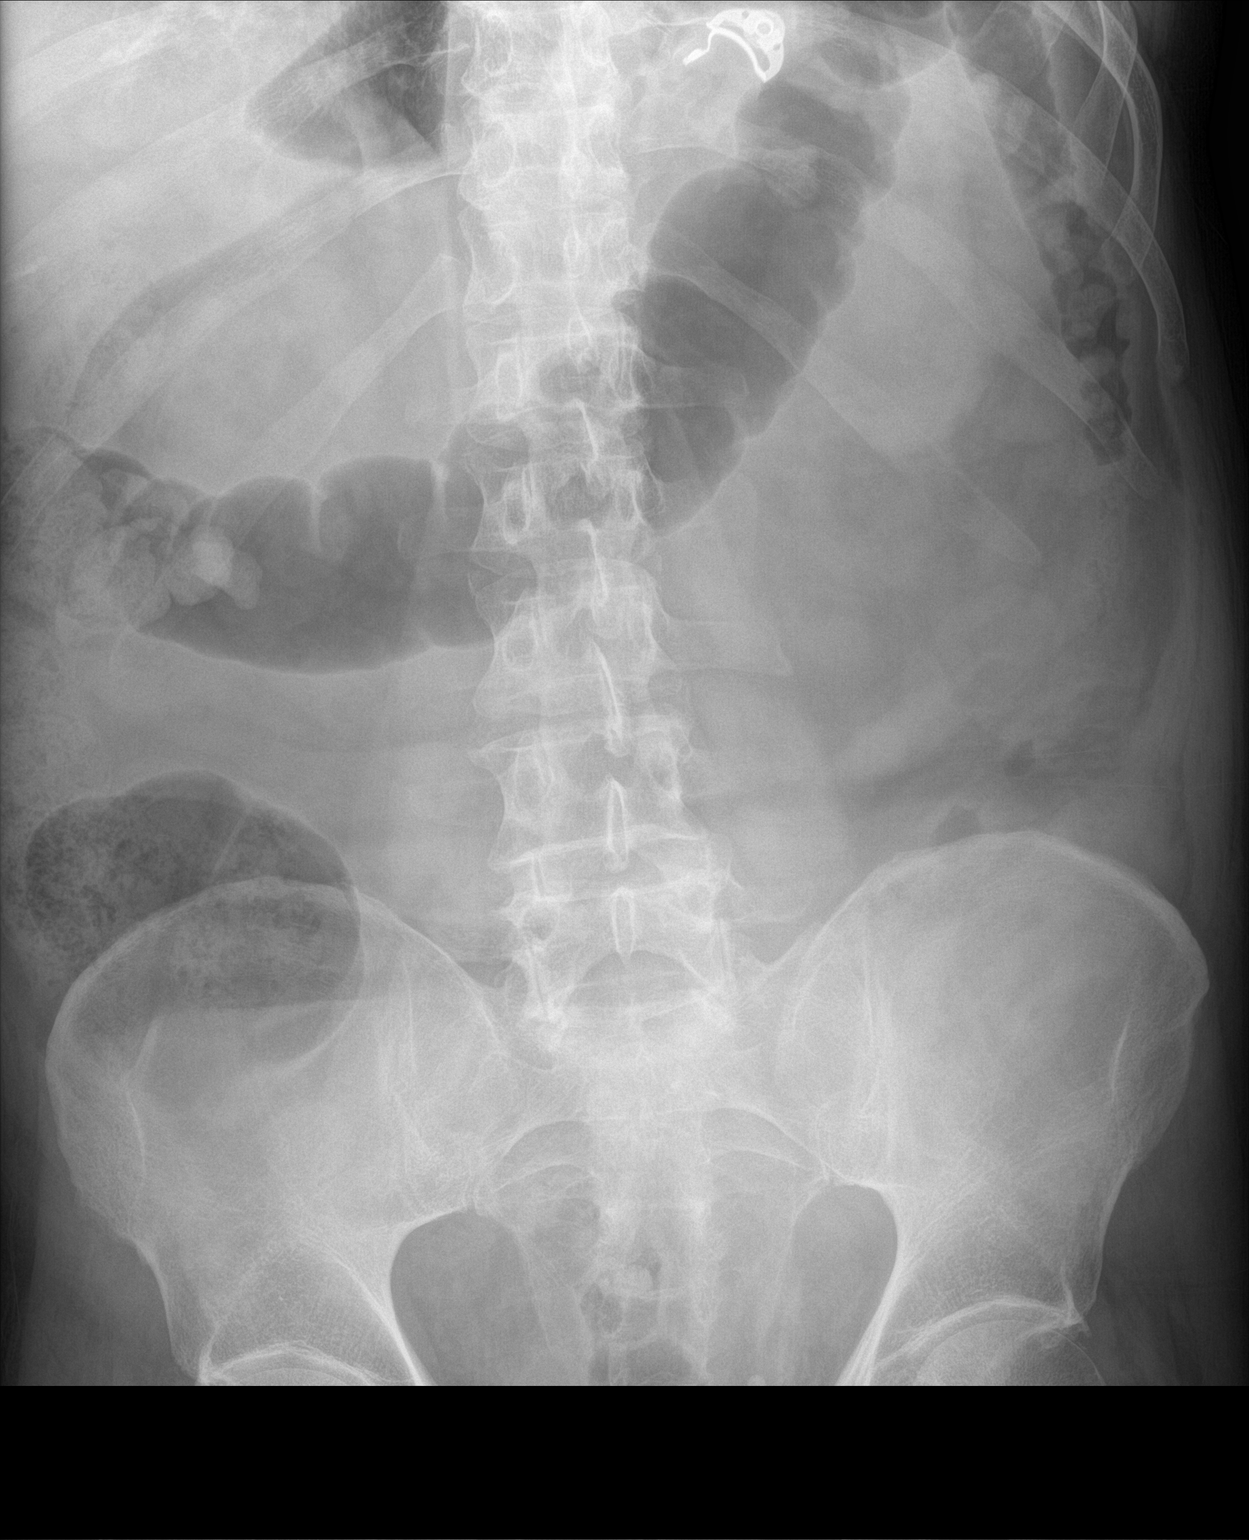

[abdomen erect]
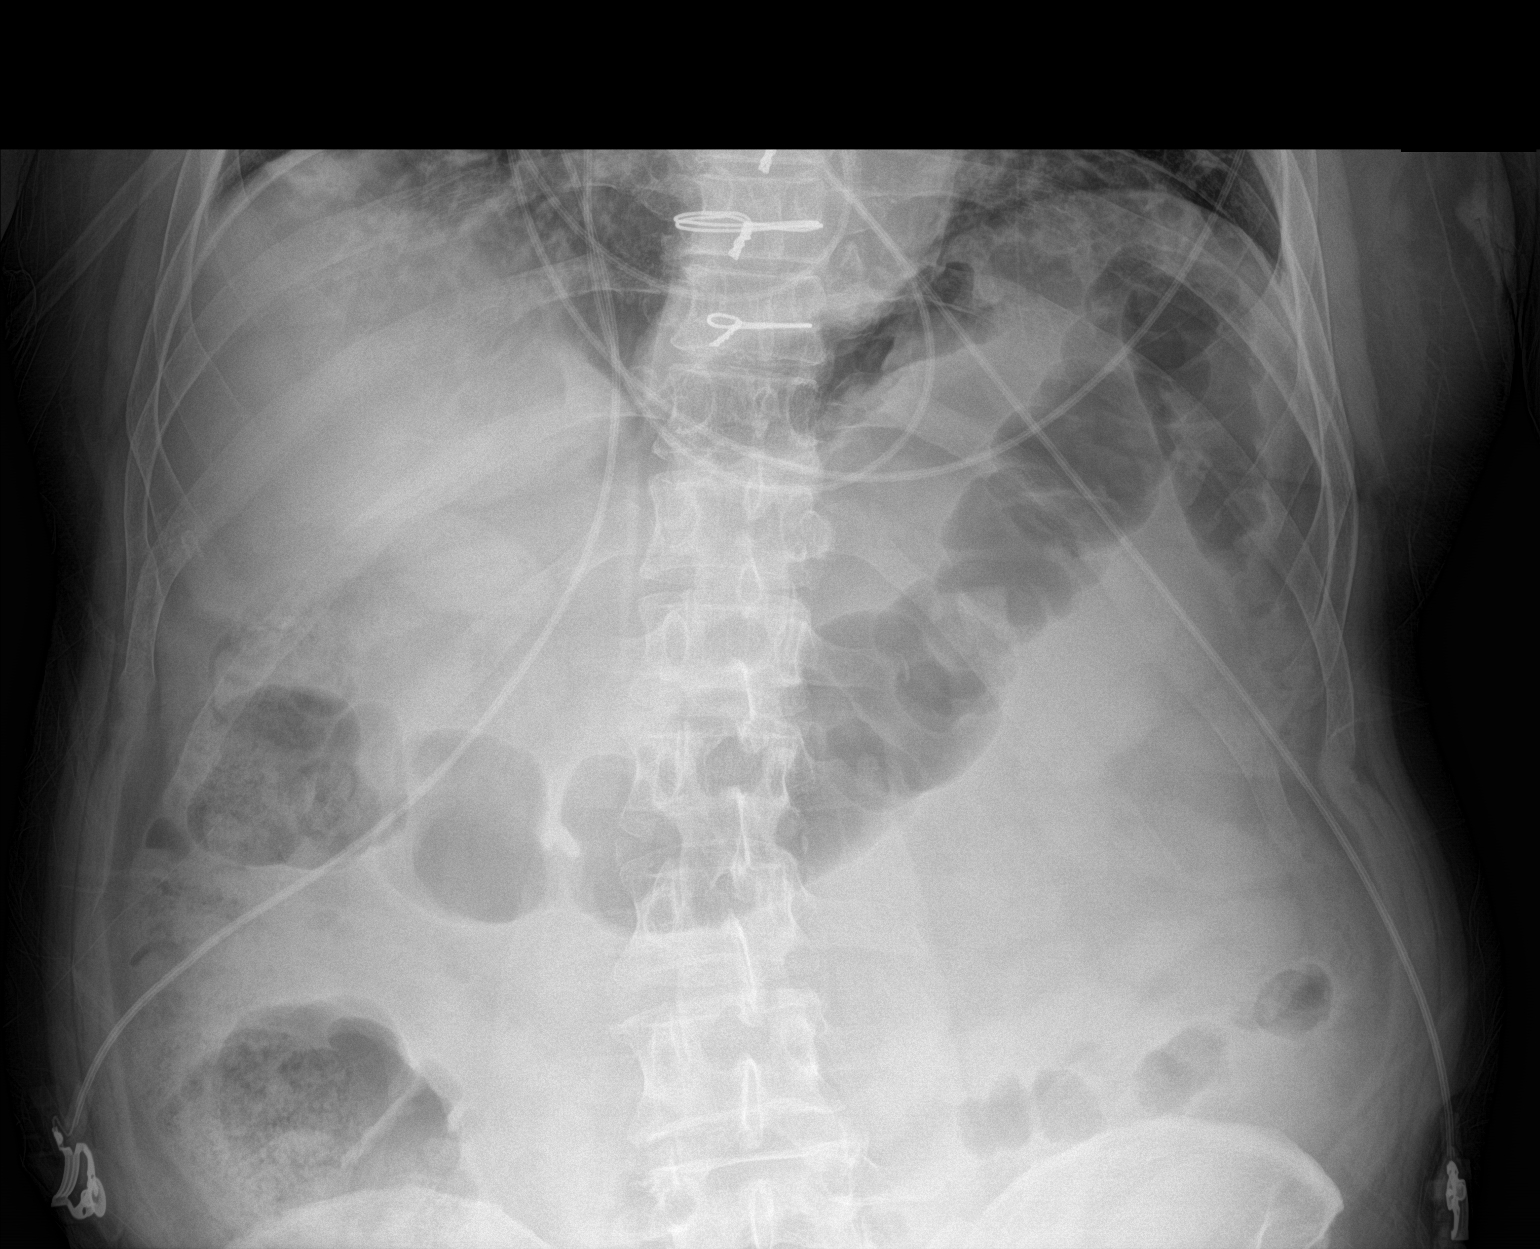

[2 of 2 positions shown; findings below may reference images not displayed]

FINDINGS: Nonobstructive bowel gas pattern. Gas throughout moderately
prominent right colon and transverse colon. No small bowel
distention. No organomegaly or free air. No suspicious
calcification.
IMPRESSION: No acute findings.

## 2021-10-22 ENCOUNTER — Other Ambulatory Visit (HOSPITAL_COMMUNITY): Payer: Medicare Other

## 2021-10-28 ENCOUNTER — Encounter (HOSPITAL_COMMUNITY): Payer: Self-pay | Admitting: Radiology

## 2021-10-28 ENCOUNTER — Ambulatory Visit (HOSPITAL_COMMUNITY)
Admission: RE | Admit: 2021-10-28 | Discharge: 2021-10-28 | Disposition: A | Discharge status: Home / Self Care | Payer: Medicare Other | Source: Ambulatory Visit | Attending: Cardiology | Admitting: Cardiology

## 2021-10-28 ENCOUNTER — Other Ambulatory Visit: Payer: Self-pay

## 2021-10-28 DIAGNOSIS — I71019 Dissection of thoracic aorta, unspecified: Secondary | ICD-10-CM | POA: Insufficient documentation

## 2021-10-28 DIAGNOSIS — Z8679 Personal history of other diseases of the circulatory system: Secondary | ICD-10-CM | POA: Diagnosis not present

## 2021-10-28 DIAGNOSIS — I7101 Dissection of ascending aorta: Secondary | ICD-10-CM | POA: Diagnosis not present

## 2021-10-28 DIAGNOSIS — Z9889 Other specified postprocedural states: Secondary | ICD-10-CM | POA: Diagnosis not present

## 2021-10-28 LAB — POCT I-STAT CREATININE: Creatinine, Ser: 1.7 mg/dL — ABNORMAL HIGH (ref 0.61–1.24)

## 2021-10-28 MED ORDER — IOHEXOL 350 MG/ML SOLN
100.0000 mL | Freq: Once | INTRAVENOUS | Status: AC | PRN
Start: 2021-10-28 — End: 2021-10-28
  Administered 2021-10-28: 75 mL via INTRAVENOUS

## 2021-10-30 ENCOUNTER — Encounter: Payer: Self-pay | Admitting: *Deleted

## 2021-11-20 ENCOUNTER — Ambulatory Visit: Payer: Medicare Other | Admitting: Family Medicine

## 2021-11-28 ENCOUNTER — Ambulatory Visit: Payer: Medicare Other | Admitting: Family Medicine

## 2022-01-08 ENCOUNTER — Ambulatory Visit: Payer: Medicare Other | Admitting: Family Medicine

## 2022-02-10 ENCOUNTER — Encounter: Payer: Self-pay | Admitting: Family Medicine

## 2022-02-10 ENCOUNTER — Ambulatory Visit (INDEPENDENT_AMBULATORY_CARE_PROVIDER_SITE_OTHER): Payer: Medicare Other | Admitting: Family Medicine

## 2022-02-10 VITALS — BP 126/76 | HR 66 | Temp 98.0°F | Ht 69.0 in | Wt 205.0 lb

## 2022-02-10 DIAGNOSIS — N1831 Chronic kidney disease, stage 3a: Secondary | ICD-10-CM | POA: Diagnosis not present

## 2022-02-10 DIAGNOSIS — E7800 Pure hypercholesterolemia, unspecified: Secondary | ICD-10-CM

## 2022-02-10 DIAGNOSIS — I10 Essential (primary) hypertension: Secondary | ICD-10-CM

## 2022-02-10 DIAGNOSIS — Z1211 Encounter for screening for malignant neoplasm of colon: Secondary | ICD-10-CM

## 2022-02-10 MED ORDER — METOPROLOL TARTRATE 25 MG PO TABS: 25.0000 mg | ORAL_TABLET | Freq: Two times a day (BID) | ORAL | 3 refills | Status: DC

## 2022-02-10 MED ORDER — METOPROLOL TARTRATE 100 MG PO TABS: 100.0000 mg | ORAL_TABLET | Freq: Two times a day (BID) | ORAL | 3 refills | Status: DC

## 2022-02-10 MED ORDER — SIMVASTATIN 20 MG PO TABS: 20.0000 mg | ORAL_TABLET | Freq: Every day | ORAL | 3 refills | Status: DC

## 2022-02-10 NOTE — Progress Notes (Signed)
BP 126/76   Pulse 66   Temp 98 F (36.7 C)   Ht 5\' 9"  (1.753 m)   Wt 205 lb (93 kg)   SpO2 97%   BMI 30.27 kg/m    Subjective:   Patient ID: Nicholas Palmer, male    DOB: 15-Jan-1956, 66 y.o.   MRN: [id number]  HPI: Nicholas Palmer is a 66 y.o. male presenting on 02/10/2022 for Medical Management of Chronic Issues and Hypertension   HPI Hypertension Patient is currently on metoprolol, and their blood pressure today is 126/76. Patient denies any lightheadedness or dizziness. Patient denies headaches, blurred vision, chest pains, shortness of breath, or weakness. Denies any side effects from medication and is content with current medication.   Hyperlipidemia Patient is coming in for recheck of his hyperlipidemia. The patient is currently taking simvastatin. They deny any issues with myalgias or history of liver damage from it. They deny any focal numbness or weakness or chest pain.   Acute on CKD Patient has had gradually worsening kidney function for last couple times to where it fits into the stage III category, and recommended hydration.  He does not take any over-the-counter extra medicines except a baby aspirin daily.  He is not currently on any other medicines that would affect this.  He denies any urinary obstruction or difficulty urinating.  He says urinates frequently during the daytime when he drinks plenty of fluids but then he does not have to wake up at night at all.  He does have psoriasis on his nails.  Relevant past medical, surgical, family and social history reviewed and updated as indicated. Interim medical history since our last visit reviewed. Allergies and medications reviewed and updated.  Review of Systems  Constitutional:  Negative for chills and fever.  Eyes:  Negative for visual disturbance.  Respiratory:  Negative for shortness of breath and wheezing.   Cardiovascular:  Negative for chest pain and leg swelling.  Musculoskeletal:  Negative for back pain and  gait problem.  Skin:  Negative for rash.  Neurological:  Negative for dizziness, weakness and light-headedness.  All other systems reviewed and are negative.   Per HPI unless specifically indicated above   Allergies as of 02/10/2022       Reactions   Codeine Nausea And Vomiting   Quinolones    Patient was warned about not using Cipro and similar antibiotics. Recent studies have raised concern that fluoroquinolone antibiotics could be associated with an increased risk of aortic aneurysm Fluoroquinolones have non-antimicrobial properties that might jeopardise the integrity of the extracellular matrix of the vascular wall In a  propensity score matched cohort study in [place], there was a 66% increased rate of aortic aneurysm or dissection associated with oral fluoroquinolone use, compared wit        Medication List        Accurate as of February 10, 2022  9:09 AM. If you have any questions, ask your nurse or doctor.          aspirin 81 MG tablet Take 1 tablet (81 mg total) by mouth daily.   metoprolol tartrate 100 MG tablet Commonly known as: LOPRESSOR Take 1 tablet (100 mg total) by mouth 2 (two) times daily. In addition to 25mg  twice daily What changed: Another medication with the same name was changed. Make sure you understand how and when to take each. Changed by: Elige Radon Celene Pippins, MD   metoprolol tartrate 25 MG tablet Commonly known as: LOPRESSOR Take 1  tablet (25 mg total) by mouth 2 (two) times daily. What changed:  how much to take how to take this when to take this additional instructions Changed by: Elige Radon Jamieson Lisa, MD   simvastatin 20 MG tablet Commonly known as: ZOCOR Take 1 tablet (20 mg total) by mouth daily at 6 PM. What changed:  how much to take how to take this when to take this additional instructions Changed by: Elige Radon Caleb Prigmore, MD         Objective:   BP 126/76   Pulse 66   Temp 98 F (36.7 C)   Ht 5\' 9"  (1.753 m)   Wt 205 lb  (93 kg)   SpO2 97%   BMI 30.27 kg/m   Wt Readings from Last 3 Encounters:  02/10/22 205 lb (93 kg)  06/24/21 213 lb (96.6 kg)  05/21/21 211 lb (95.7 kg)    Physical Exam Vitals and nursing note reviewed.  Constitutional:      General: He is not in acute distress.    Appearance: He is well-developed. He is not diaphoretic.  Eyes:     General: No scleral icterus.    Conjunctiva/sclera: Conjunctivae normal.  Neck:     Thyroid: No thyromegaly.  Cardiovascular:     Rate and Rhythm: Normal rate and regular rhythm.     Heart sounds: Normal heart sounds. No murmur heard. Pulmonary:     Effort: Pulmonary effort is normal. No respiratory distress.     Breath sounds: Normal breath sounds. No wheezing.  Musculoskeletal:        General: No swelling. Normal range of motion.     Cervical back: Neck supple.  Lymphadenopathy:     Cervical: No cervical adenopathy.  Skin:    General: Skin is warm and dry.     Findings: No rash.  Neurological:     Mental Status: He is alert and oriented to person, place, and time.     Coordination: Coordination normal.  Psychiatric:        Behavior: Behavior normal.       Assessment & Plan:   Problem List Items Addressed This Visit       Cardiovascular and Mediastinum   Essential hypertension - Primary   Relevant Medications   metoprolol tartrate (LOPRESSOR) 100 MG tablet   metoprolol tartrate (LOPRESSOR) 25 MG tablet   simvastatin (ZOCOR) 20 MG tablet   Other Relevant Orders   CMP14+EGFR   Lipid panel     Genitourinary   Chronic kidney disease, stage 3a (HCC)   Relevant Orders   CBC with Differential/Platelet   CMP14+EGFR     Other   Hypercholesterolemia   Relevant Medications   metoprolol tartrate (LOPRESSOR) 100 MG tablet   metoprolol tartrate (LOPRESSOR) 25 MG tablet   simvastatin (ZOCOR) 20 MG tablet   Other Relevant Orders   CBC with Differential/Platelet   CMP14+EGFR   Lipid panel   Other Visit Diagnoses     Colon  cancer screening       Relevant Orders   Cologuard       He has been gaining weight over the last little bit but does not appear to be fluid overloaded or have any edema.  Continue current medicine.  We will recheck renal function today and if elevated then will send to nephrology.  Concerned that his psoriasis may be affecting his kidneys   Follow up plan: Return in about 6 months (around 08/13/2022), or if symptoms worsen or fail to improve,  for Hypertension cholesterol CKD.  Counseling provided for all of the vaccine components Orders Placed This Encounter  Procedures   CBC with Differential/Platelet   CMP14+EGFR   Lipid panel   Cologuard    Arville Care, MD [facility] 02/10/2022, 9:09 AM

## 2022-02-11 LAB — CBC WITH DIFFERENTIAL/PLATELET
Basophils Absolute: 0 10*3/uL (ref 0.0–0.2)
Basos: 0 %
EOS (ABSOLUTE): 0.1 10*3/uL (ref 0.0–0.4)
Eos: 1 %
Hematocrit: 43.6 % (ref 37.5–51.0)
Hemoglobin: 14.6 g/dL (ref 13.0–17.7)
Immature Grans (Abs): 0 10*3/uL (ref 0.0–0.1)
Immature Granulocytes: 0 %
Lymphocytes Absolute: 1.6 10*3/uL (ref 0.7–3.1)
Lymphs: 26 %
MCH: 29.4 pg (ref 26.6–33.0)
MCHC: 33.5 g/dL (ref 31.5–35.7)
MCV: 88 fL (ref 79–97)
Monocytes Absolute: 0.5 10*3/uL (ref 0.1–0.9)
Monocytes: 8 %
Neutrophils Absolute: 4.1 10*3/uL (ref 1.4–7.0)
Neutrophils: 65 %
Platelets: 160 10*3/uL (ref 150–450)
RBC: 4.97 x10E6/uL (ref 4.14–5.80)
RDW: 12.5 % (ref 11.6–15.4)
WBC: 6.3 10*3/uL (ref 3.4–10.8)

## 2022-02-11 LAB — CMP14+EGFR
ALT: 14 IU/L (ref 0–44)
AST: 16 IU/L (ref 0–40)
Albumin/Globulin Ratio: 2 (ref 1.2–2.2)
Albumin: 4.6 g/dL (ref 3.8–4.8)
Alkaline Phosphatase: 47 IU/L (ref 44–121)
BUN/Creatinine Ratio: 13 (ref 10–24)
BUN: 22 mg/dL (ref 8–27)
Bilirubin Total: 0.5 mg/dL (ref 0.0–1.2)
CO2: 23 mmol/L (ref 20–29)
Calcium: 9.5 mg/dL (ref 8.6–10.2)
Chloride: 107 mmol/L — ABNORMAL HIGH (ref 96–106)
Creatinine, Ser: 1.65 mg/dL — ABNORMAL HIGH (ref 0.76–1.27)
Globulin, Total: 2.3 g/dL (ref 1.5–4.5)
Glucose: 103 mg/dL — ABNORMAL HIGH (ref 70–99)
Potassium: 4.5 mmol/L (ref 3.5–5.2)
Sodium: 146 mmol/L — ABNORMAL HIGH (ref 134–144)
Total Protein: 6.9 g/dL (ref 6.0–8.5)
eGFR: 45 mL/min/{1.73_m2} — ABNORMAL LOW (ref >59)

## 2022-02-11 LAB — LIPID PANEL
Chol/HDL Ratio: 3 ratio (ref 0.0–5.0)
Cholesterol, Total: 118 mg/dL (ref 100–199)
HDL: 40 mg/dL (ref >39)
LDL Chol Calc (NIH): 64 mg/dL (ref 0–99)
Triglycerides: 65 mg/dL (ref 0–149)
VLDL Cholesterol Cal: 14 mg/dL (ref 5–40)

## 2022-06-30 ENCOUNTER — Telehealth: Payer: Self-pay | Admitting: Cardiology

## 2022-06-30 DIAGNOSIS — I10 Essential (primary) hypertension: Secondary | ICD-10-CM

## 2022-06-30 DIAGNOSIS — E7800 Pure hypercholesterolemia, unspecified: Secondary | ICD-10-CM

## 2022-06-30 MED ORDER — METOPROLOL TARTRATE 25 MG PO TABS: 25.0000 mg | ORAL_TABLET | Freq: Two times a day (BID) | ORAL | 0 refills | Status: DC

## 2022-06-30 MED ORDER — METOPROLOL TARTRATE 100 MG PO TABS: 100.0000 mg | ORAL_TABLET | Freq: Two times a day (BID) | ORAL | 0 refills | Status: DC

## 2022-06-30 MED ORDER — SIMVASTATIN 20 MG PO TABS: 20.0000 mg | ORAL_TABLET | Freq: Every day | ORAL | 0 refills | Status: DC

## 2022-06-30 NOTE — Telephone Encounter (Signed)
*  STAT* If patient is at the pharmacy, call can be transferred to refill team.   1. Which medications need to be refilled? (please list name of each medication and dose if known)   metoprolol tartrate (LOPRESSOR) 100 MG tablet  metoprolol tartrate (LOPRESSOR) 25 MG tablet  simvastatin (ZOCOR) 20 MG tablet   2. Which pharmacy/location (including street and city if local pharmacy) is medication to be sent to?  [id number] - [place],  - [address]   3. Do they need a 30 day or 90 day supply? 90 day  Patient stated he still has some of these medications.  Patient has appointment scheduled on  10/08/22

## 2022-08-05 ENCOUNTER — Ambulatory Visit (INDEPENDENT_AMBULATORY_CARE_PROVIDER_SITE_OTHER): Payer: Medicare Other

## 2022-08-05 VITALS — Ht 69.0 in | Wt 205.0 lb

## 2022-08-05 DIAGNOSIS — Z0000 Encounter for general adult medical examination without abnormal findings: Secondary | ICD-10-CM | POA: Diagnosis not present

## 2022-08-05 DIAGNOSIS — Z0100 Encounter for examination of eyes and vision without abnormal findings: Secondary | ICD-10-CM

## 2022-08-05 NOTE — Patient Instructions (Signed)
Nicholas Palmer , Thank you for taking time to come for your Medicare Wellness Visit. I appreciate your ongoing commitment to your health goals. Please review the following plan we discussed and let me know if I can assist you in the future.   These are the goals we discussed:  Goals      Exercise 3x per week (30 min per time)     Continue to exercise and try to stay healthy.     Have 3 meals a day     Eat 3 meals daily that consist of lean proteins fruits and vegetables.      Have 3 meals a day     Eat 3 meals daily that consist of lean proteins fruits and vegetables         This is a list of the screening recommended for you and due dates:  Health Maintenance  Topic Date Due   DTaP/Tdap/Td vaccine (1 - Tdap) Never done   Zoster (Shingles) Vaccine (1 of 2) Never done   Flu Shot  Never done   Pneumonia Vaccine (1 - PCV) 02/11/2023*   Colon Cancer Screening  02/11/2023*   Hepatitis C Screening: USPSTF Recommendation to screen - Ages 18-79 yo.  02/11/2023*   COVID-19 Vaccine (1) 02/12/2023*   Medicare Annual Wellness Visit  08/06/2023   HPV Vaccine  Aged Out  *Topic was postponed. The date shown is not the original due date.    Advanced directives: Advance directive discussed with you today. I have provided a copy for you to complete at home and have notarized. Once this is complete please bring a copy in to our office so we can scan it into your chart.   Conditions/risks identified: Aim for 30 minutes of exercise or brisk walking, 6-8 glasses of water, and 5 servings of fruits and vegetables each day.   Next appointment: Follow up in one year for your annual wellness visit.   Preventive Care 67 Years and Older, Male  Preventive care refers to lifestyle choices and visits with your health care provider that can promote health and wellness. What does preventive care include? A yearly physical exam. This is also called an annual well check. Dental exams once or twice a  year. Routine eye exams. Ask your health care provider how often you should have your eyes checked. Personal lifestyle choices, including: Daily care of your teeth and gums. Regular physical activity. Eating a healthy diet. Avoiding tobacco and drug use. Limiting alcohol use. Practicing safe sex. Taking low doses of aspirin every day. Taking vitamin and mineral supplements as recommended by your health care provider. What happens during an annual well check? The services and screenings done by your health care provider during your annual well check will depend on your age, overall health, lifestyle risk factors, and family history of disease. Counseling  Your health care provider may ask you questions about your: Alcohol use. Tobacco use. Drug use. Emotional well-being. Home and relationship well-being. Sexual activity. Eating habits. History of falls. Memory and ability to understand (cognition). Work and work Astronomer. Screening  You may have the following tests or measurements: Height, weight, and BMI. Blood pressure. Lipid and cholesterol levels. These may be checked every 5 years, or more frequently if you are over 16 years old. Skin check. Lung cancer screening. You may have this screening every year starting at age 67 if you have a 30-pack-year history of smoking and currently smoke or have quit within the past 15 years.  Fecal occult blood test (FOBT) of the stool. You may have this test every year starting at age 67. Flexible sigmoidoscopy or colonoscopy. You may have a sigmoidoscopy every 5 years or a colonoscopy every 10 years starting at age 67. Prostate cancer screening. Recommendations will vary depending on your family history and other risks. Hepatitis C blood test. Hepatitis B blood test. Sexually transmitted disease (STD) testing. Diabetes screening. This is done by checking your blood sugar (glucose) after you have not eaten for a while (fasting). You may  have this done every 1-3 years. Abdominal aortic aneurysm (AAA) screening. You may need this if you are a current or former smoker. Osteoporosis. You may be screened starting at age 67 if you are at high risk. Talk with your health care provider about your test results, treatment options, and if necessary, the need for more tests. Vaccines  Your health care provider may recommend certain vaccines, such as: Influenza vaccine. This is recommended every year. Tetanus, diphtheria, and acellular pertussis (Tdap, Td) vaccine. You may need a Td booster every 10 years. Zoster vaccine. You may need this after age 67. Pneumococcal 13-valent conjugate (PCV13) vaccine. One dose is recommended after age 67. Pneumococcal polysaccharide (PPSV23) vaccine. One dose is recommended after age 67. Talk to your health care provider about which screenings and vaccines you need and how often you need them. This information is not intended to replace advice given to you by your health care provider. Make sure you discuss any questions you have with your health care provider. Document Released: 08/24/2015 Document Revised: 04/16/2016 Document Reviewed: 05/29/2015 Elsevier Interactive Patient Education  2017 [place] Prevention in the Home Falls can cause injuries. They can happen to people of all ages. There are many things you can do to make your home safe and to help prevent falls. What can I do on the outside of my home? Regularly fix the edges of walkways and driveways and fix any cracks. Remove anything that might make you trip as you walk through a door, such as a raised step or threshold. Trim any bushes or trees on the path to your home. Use bright outdoor lighting. Clear any walking paths of anything that might make someone trip, such as rocks or tools. Regularly check to see if handrails are loose or broken. Make sure that both sides of any steps have handrails. Any raised decks and porches  should have guardrails on the edges. Have any leaves, snow, or ice cleared regularly. Use sand or salt on walking paths during winter. Clean up any spills in your garage right away. This includes oil or grease spills. What can I do in the bathroom? Use night lights. Install grab bars by the toilet and in the tub and shower. Do not use towel bars as grab bars. Use non-skid mats or decals in the tub or shower. If you need to sit down in the shower, use a plastic, non-slip stool. Keep the floor dry. Clean up any water that spills on the floor as soon as it happens. Remove soap buildup in the tub or shower regularly. Attach bath mats securely with double-sided non-slip rug tape. Do not have throw rugs and other things on the floor that can make you trip. What can I do in the bedroom? Use night lights. Make sure that you have a light by your bed that is easy to reach. Do not use any sheets or blankets that are too big for your bed. They  should not hang down onto the floor. Have a firm chair that has side arms. You can use this for support while you get dressed. Do not have throw rugs and other things on the floor that can make you trip. What can I do in the kitchen? Clean up any spills right away. Avoid walking on wet floors. Keep items that you use a lot in easy-to-reach places. If you need to reach something above you, use a strong step stool that has a grab bar. Keep electrical cords out of the way. Do not use floor polish or wax that makes floors slippery. If you must use wax, use non-skid floor wax. Do not have throw rugs and other things on the floor that can make you trip. What can I do with my stairs? Do not leave any items on the stairs. Make sure that there are handrails on both sides of the stairs and use them. Fix handrails that are broken or loose. Make sure that handrails are as long as the stairways. Check any carpeting to make sure that it is firmly attached to the stairs.  Fix any carpet that is loose or worn. Avoid having throw rugs at the top or bottom of the stairs. If you do have throw rugs, attach them to the floor with carpet tape. Make sure that you have a light switch at the top of the stairs and the bottom of the stairs. If you do not have them, ask someone to add them for you. What else can I do to help prevent falls? Wear shoes that: Do not have high heels. Have rubber bottoms. Are comfortable and fit you well. Are closed at the toe. Do not wear sandals. If you use a stepladder: Make sure that it is fully opened. Do not climb a closed stepladder. Make sure that both sides of the stepladder are locked into place. Ask someone to hold it for you, if possible. Clearly mark and make sure that you can see: Any grab bars or handrails. First and last steps. Where the edge of each step is. Use tools that help you move around (mobility aids) if they are needed. These include: Canes. Walkers. Scooters. Crutches. Turn on the lights when you go into a dark area. Replace any light bulbs as soon as they burn out. Set up your furniture so you have a clear path. Avoid moving your furniture around. If any of your floors are uneven, fix them. If there are any pets around you, be aware of where they are. Review your medicines with your doctor. Some medicines can make you feel dizzy. This can increase your chance of falling. Ask your doctor what other things that you can do to help prevent falls. This information is not intended to replace advice given to you by your health care provider. Make sure you discuss any questions you have with your health care provider. Document Released: 05/24/2009 Document Revised: 01/03/2016 Document Reviewed: 09/01/2014 Elsevier Interactive Patient Education  2017 [employer].

## 2022-08-05 NOTE — Progress Notes (Signed)
Subjective:   Nicholas Palmer is a 66 y.o. male who presents for Medicare Annual/Subsequent preventive examination. I connected with  Nicholas Palmer on 08/05/22 by a audio enabled telemedicine application and verified that I am speaking with the correct person using two identifiers.  Patient Location: Home  Provider Location: Home Office  I discussed the limitations of evaluation and management by telemedicine. The patient expressed understanding and agreed to proceed.  Review of Systems     Cardiac Risk Factors include: advanced age (>2men, >83 women)     Objective:    Today's Vitals   08/05/22 1446  Weight: 205 lb (93 kg)  Height: 5\' 9"  (1.753 m)   Body mass index is 30.27 kg/m.     08/05/2022    2:50 PM 06/24/2021    2:47 PM 08/09/2020    1:47 AM 08/08/2020    2:45 AM  Advanced Directives  Does Patient Have a Medical Advance Directive? No No  No  Would patient like information on creating a medical advance directive? No - Patient declined No - Patient declined No - Patient declined     Current Medications (verified) Outpatient Encounter Medications as of 08/05/2022  Medication Sig   aspirin 81 MG tablet Take 1 tablet (81 mg total) by mouth daily.   metoprolol tartrate (LOPRESSOR) 100 MG tablet Take 1 tablet (100 mg total) by mouth 2 (two) times daily. In addition to 25mg  twice daily. SCHEDULE OFFICE VISIT FOR FUTURE REFILLS.   metoprolol tartrate (LOPRESSOR) 25 MG tablet Take 1 tablet (25 mg total) by mouth 2 (two) times daily. SCHEDULE OFFICE VISIT FOR FUTURE REFILLS.   simvastatin (ZOCOR) 20 MG tablet Take 1 tablet (20 mg total) by mouth daily at 6 PM. SCHEDULE OFFICE VISIT FOR FUTURE REFILLS   No facility-administered encounter medications on file as of 08/05/2022.    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  06/10/10 Dr. 2012   Family History  Problem Relation Age of Onset   Lung disease Mother        copd   Heart disease Father    Heart disease Brother    Kidney disease Sister    Stroke Sister    Social History   Socioeconomic History   Marital status: Legally Separated    Spouse name: Not on file   Number of children: Not on file   Years of education: Not on file   Highest education level: Not on file  Occupational History   Not on file  Tobacco Use   Smoking status: Former    Types: Cigarettes    Quit date: 08/11/2002    Years since quitting: 19.9   Smokeless tobacco: Never  Vaping Use   Vaping Use: Never used  Substance and Sexual Activity   Alcohol use: No    Alcohol/week: 0.0 standard drinks of alcohol  Drug use: No   Sexual activity: Not Currently    Comment: separated, no kids  Other Topics Concern   Not on file  Social History Narrative   Has 20-pack-year history of tobacco abuse, quitting many years ago. Denies alcohol or drugs. Does not routinely exercise    Social Determinants of Health   Financial Resource Strain: Low Risk  (08/05/2022)   Overall Financial Resource Strain (CARDIA)    Difficulty of Paying Living Expenses: Not hard at all  Food Insecurity: No Food Insecurity (08/05/2022)   Hunger Vital Sign    Worried About Running Out of Food in the Last Year: Never true    Ran Out of Food in the Last Year: Never true  Transportation Needs: No Transportation Needs (08/05/2022)   PRAPARE - Therapist, art (Medical): No    Lack of Transportation (Non-Medical): No  Physical Activity: Sufficiently Active (08/05/2022)   Exercise Vital Sign    Days of Exercise per Week: 5 days    Minutes of Exercise per Session: 30 min  Stress: No Stress Concern Present (08/05/2022)   [employer] of Occupational Health - Occupational Stress Questionnaire    Feeling of Stress : Not at all  Social Connections: Socially Isolated (08/05/2022)   Social Connection and Isolation Panel [NHANES]    Frequency of Communication with Friends and Family: More than three times a week    Frequency of Social Gatherings with Friends and Family: More than three times a week    Attends Religious Services: Never    Database administrator or Organizations: No    Attends Engineer, structural: Never    Marital Status: Separated    Tobacco Counseling Counseling given: Not Answered   Clinical Intake:  Pre-visit preparation completed: Yes  Pain : No/denies pain     Nutritional Risks: None Diabetes: No  How often do you need to have someone help you when you read instructions, pamphlets, or other written materials from your doctor or pharmacy?: 1 - Never  Diabetic?no   Interpreter Needed?: No  Information entered by :: Renie Ora, LPN   Activities of Daily Living    08/05/2022    2:50 PM  In your present state of health, do you have any difficulty performing the following activities:  Hearing? 0  Vision? 0  Difficulty concentrating or making decisions? 0  Walking or climbing stairs? 0  Dressing or bathing? 0  Doing errands, shopping? 0  Preparing Food and eating ? N  Using the Toilet? N  In the past six months, have you accidently leaked urine? N  Do you have problems with loss of bowel control? N  Managing your Medications? N  Managing your Finances? N  Housekeeping or managing your Housekeeping? N    Patient Care Team: Dettinger, Elige Radon, MD as PCP - General  (Family Medicine) Rollene Rotunda, MD as Attending Physician (Cardiology)  Indicate any recent Medical Services you may have received from other than [facility] providers in the past year (date may be approximate).     Assessment:   This is a routine wellness examination for Nicholas Palmer.  Hearing/Vision screen Vision Screening - Comments:: Referral 08/05/2022  Dietary issues and exercise activities discussed: Current Exercise Habits: Home exercise routine, Type of exercise: walking, Time (Minutes): 30, Frequency (Times/Week): 5, Weekly Exercise (Minutes/Week): 150, Intensity: Mild, Exercise limited by: None identified   Goals Addressed             This  Visit's Progress    Exercise 3x per week (30 min per time)   On track    Continue to exercise and try to stay healthy.       Depression Screen    08/05/2022    2:49 PM 02/10/2022    8:47 AM 06/24/2021    2:43 PM 05/21/2021   10:26 AM 10/25/2020    2:11 PM 09/05/2020   11:04 AM 12/23/2017    9:32 AM  PHQ 2/9 Scores  PHQ - 2 Score 0 0 0 0 0 0 0  PHQ- 9 Score  0         Fall Risk    08/05/2022    2:47 PM 02/10/2022    8:40 AM 06/24/2021    2:48 PM 05/21/2021   10:26 AM 10/25/2020    2:11 PM  Fall Risk   Falls in the past year? 0 0 0 0 0  Number falls in past yr: 0  0    Injury with Fall? 0  0    Risk for fall due to : No Fall Risks  Impaired vision    Follow up Falls prevention discussed  Falls prevention discussed      FALL RISK PREVENTION PERTAINING TO THE HOME:  Any stairs in or around the home? No  If so, are there any without handrails? No  Home free of loose throw rugs in walkways, pet beds, electrical cords, etc? No  Adequate lighting in your home to reduce risk of falls? No   ASSISTIVE DEVICES UTILIZED TO PREVENT FALLS:  Life alert? No  Use of a cane, walker or w/c? No  Grab bars in the bathroom? Yes  Shower chair or bench in shower? Yes  Elevated toilet seat or a handicapped toilet? Yes       11/24/2017    10:39 AM  MMSE - Mini Mental State Exam  Orientation to time 5  Orientation to Place 5  Registration 3  Attention/ Calculation 5  Recall 3  Language- name 2 objects 2  Language- repeat 1  Language- follow 3 step command 3  Language- read & follow direction 1  Write a sentence 1  Copy design 1  Total score 30        08/05/2022    2:50 PM 06/24/2021    2:50 PM  6CIT Screen  What Year? 0 points 0 points  What month? 0 points 0 points  What time? 0 points 0 points  Count back from 20 0 points 0 points  Months in reverse 0 points 0 points  Repeat phrase 0 points 0 points  Total Score 0 points 0 points    Immunizations  There is no immunization history on file for this patient.  TDAP status: Due, Education has been provided regarding the importance of this vaccine. Advised may receive this vaccine at local pharmacy or Health Dept. Aware to provide a copy of the vaccination record if obtained from local pharmacy or Health Dept. Verbalized acceptance and understanding.  Flu Vaccine status: Due, Education has been provided regarding the importance of this vaccine. Advised may receive this vaccine at local pharmacy or Health Dept. Aware to provide a copy of the vaccination record if obtained from local pharmacy or Health Dept. Verbalized acceptance and understanding.  Pneumococcal vaccine status: Due, Education has been provided regarding the importance of this vaccine. Advised may receive this vaccine at local pharmacy or Health Dept. Aware to provide a copy of the vaccination record if obtained  from local pharmacy or Health Dept. Verbalized acceptance and understanding.  Covid-19 vaccine status: Declined, Education has been provided regarding the importance of this vaccine but patient still declined. Advised may receive this vaccine at local pharmacy or Health Dept.or vaccine clinic. Aware to provide a copy of the vaccination record if obtained from local pharmacy or Health Dept.  Verbalized acceptance and understanding.  Qualifies for Shingles Vaccine? Yes   Zostavax completed No   Shingrix Completed?: No.    Education has been provided regarding the importance of this vaccine. Patient has been advised to call insurance company to determine out of pocket expense if they have not yet received this vaccine. Advised may also receive vaccine at local pharmacy or Health Dept. Verbalized acceptance and understanding.  Screening Tests Health Maintenance  Topic Date Due   DTaP/Tdap/Td (1 - Tdap) Never done   Zoster Vaccines- Shingrix (1 of 2) Never done   INFLUENZA VACCINE  Never done   Pneumonia Vaccine 8165+ Years old (1 - PCV) 02/11/2023 (Originally 10/17/2019)   COLONOSCOPY (Pts 45-3353yrs Insurance coverage will need to be confirmed)  02/11/2023 (Originally 10/17/1999)   Hepatitis C Screening  02/11/2023 (Originally 10/16/1973)   COVID-19 Vaccine (1) 02/12/2023 (Originally 10/17/1960)   Medicare Annual Wellness (AWV)  08/06/2023   HPV VACCINES  Aged Out    Health Maintenance  Health Maintenance Due  Topic Date Due   DTaP/Tdap/Td (1 - Tdap) Never done   Zoster Vaccines- Shingrix (1 of 2) Never done   INFLUENZA VACCINE  Never done    Colorectal cancer screening: Referral to GI placed declined . Pt aware the office will call re: appt.  Lung Cancer Screening: (Low Dose CT Chest recommended if Age 26-80 years, 30 pack-year currently smoking OR have quit w/in 15years.) does not qualify.   Lung Cancer Screening Referral: n/a  Additional Screening:  Hepatitis C Screening: does not qualify;   Vision Screening: Recommended annual ophthalmology exams for early detection of glaucoma and other disorders of the eye. Is the patient up to date with their annual eye exam?  No  Who is the provider or what is the name of the office in which the patient attends annual eye exams? None , referral 08/05/2022 If pt is not established with a provider, would they like to be referred to a  provider to establish care? No .   Dental Screening: Recommended annual dental exams for proper oral hygiene  Community Resource Referral / Chronic Care Management: CRR required this visit?  No   CCM required this visit?  No      Plan:     I have personally reviewed and noted the following in the patient's chart:   Medical and social history Use of alcohol, tobacco or illicit drugs  Current medications and supplements including opioid prescriptions. Patient is not currently taking opioid prescriptions. Functional ability and status Nutritional status Physical activity Advanced directives List of other physicians Hospitalizations, surgeries, and ER visits in previous 12 months Vitals Screenings to include cognitive, depression, and falls Referrals and appointments  In addition, I have reviewed and discussed with patient certain preventive protocols, quality metrics, and best practice recommendations. A written personalized care plan for preventive services as well as general preventive health recommendations were provided to patient.     Lorrene ReidLaura L Wilson, LPN   16/10/960412/26/2023   Nurse Notes: no vaccines on file due Colonoscopy patient declines.

## 2022-08-14 ENCOUNTER — Encounter: Payer: Self-pay | Admitting: Family Medicine

## 2022-08-14 ENCOUNTER — Ambulatory Visit (INDEPENDENT_AMBULATORY_CARE_PROVIDER_SITE_OTHER): Payer: Medicare Other | Admitting: Family Medicine

## 2022-08-14 VITALS — BP 129/71 | HR 71 | Temp 98.0°F | Ht 69.0 in | Wt 208.0 lb

## 2022-08-14 DIAGNOSIS — N1831 Chronic kidney disease, stage 3a: Secondary | ICD-10-CM

## 2022-08-14 DIAGNOSIS — I129 Hypertensive chronic kidney disease with stage 1 through stage 4 chronic kidney disease, or unspecified chronic kidney disease: Secondary | ICD-10-CM

## 2022-08-14 DIAGNOSIS — E7800 Pure hypercholesterolemia, unspecified: Secondary | ICD-10-CM | POA: Diagnosis not present

## 2022-08-14 DIAGNOSIS — I10 Essential (primary) hypertension: Secondary | ICD-10-CM

## 2022-08-14 DIAGNOSIS — B369 Superficial mycosis, unspecified: Secondary | ICD-10-CM | POA: Diagnosis not present

## 2022-08-14 NOTE — Progress Notes (Signed)
BP 129/71   Pulse 71   Temp 98 F (36.7 C)   Ht _0  (1.753 m)   Wt 208 lb (94.3 kg)   SpO2 100%   BMI 30.72 kg/m    Subjective:   Patient ID: Nicholas Palmer, male    DOB: March 07, 1956, 67 y.o.   MRN: [id number]  HPI: Nicholas Palmer is a 67 y.o. male presenting on 08/14/2022 for Medical Management of Chronic Issues, Hyperlipidemia, and Hypertension   HPI Hypertension and CKD 3 Patient is currently on metoprolol, and their blood pressure today is 129/71. Patient denies any lightheadedness or dizziness. Patient denies headaches, blurred vision, chest pains, shortness of breath, or weakness. Denies any side effects from medication and is content with current medication.   Hyperlipidemia Patient is coming in for recheck of his hyperlipidemia. The patient is currently taking simvastatin. They deny any issues with myalgias or history of liver damage from it. They deny any focal numbness or weakness or chest pain.   Relevant past medical, surgical, family and social history reviewed and updated as indicated. Interim medical history since our last visit reviewed. Allergies and medications reviewed and updated.  Review of Systems  Constitutional:  Negative for chills and fever.  Eyes:  Negative for visual disturbance.  Respiratory:  Negative for shortness of breath and wheezing.   Cardiovascular:  Negative for chest pain and leg swelling.  Musculoskeletal:  Negative for back pain and gait problem.  Skin:  Positive for rash.  Neurological:  Negative for dizziness, weakness and light-headedness.  All other systems reviewed and are negative.   Per HPI unless specifically indicated above   Allergies as of 08/14/2022       Reactions   Codeine Nausea And Vomiting   Quinolones    Patient was warned about not using Cipro and similar antibiotics. Recent studies have raised concern that fluoroquinolone antibiotics could be associated with an increased risk of aortic  aneurysm Fluoroquinolones have non-antimicrobial properties that might jeopardise the integrity of the extracellular matrix of the vascular wall In a  propensity score matched cohort study in [place], there was a 66% increased rate of aortic aneurysm or dissection associated with oral fluoroquinolone use, compared wit        Medication List        Accurate as of August 14, 2022 11:22 AM. If you have any questions, ask your nurse or doctor.          aspirin 81 MG tablet Take 1 tablet (81 mg total) by mouth daily.   metoprolol tartrate 100 MG tablet Commonly known as: LOPRESSOR Take 1 tablet (100 mg total) by mouth 2 (two) times daily. In addition to 37m twice daily. SCHEDULE OFFICE VISIT FOR FUTURE REFILLS.   metoprolol tartrate 25 MG tablet Commonly known as: LOPRESSOR Take 1 tablet (25 mg total) by mouth 2 (two) times daily. SCHEDULE OFFICE VISIT FOR FUTURE REFILLS.   simvastatin 20 MG tablet Commonly known as: ZOCOR Take 1 tablet (20 mg total) by mouth daily at 6 PM. SCHEDULE OFFICE VISIT FOR FUTURE REFILLS         Objective:   BP 129/71   Pulse 71   Temp 98 F (36.7 C)   Ht _1  (1.753 m)   Wt 208 lb (94.3 kg)   SpO2 100%   BMI 30.72 kg/m   Wt Readings from Last 3 Encounters:  08/14/22 208 lb (94.3 kg)  08/05/22 205 lb (93 kg)  02/10/22 205 lb (  93 kg)    Physical Exam Vitals and nursing note reviewed.  Constitutional:      General: He is not in acute distress.    Appearance: He is well-developed. He is not diaphoretic.  Eyes:     General: No scleral icterus.    Conjunctiva/sclera: Conjunctivae normal.  Neck:     Thyroid: No thyromegaly.  Cardiovascular:     Rate and Rhythm: Normal rate and regular rhythm.     Heart sounds: Normal heart sounds. No murmur heard. Pulmonary:     Effort: Pulmonary effort is normal. No respiratory distress.     Breath sounds: Normal breath sounds. No wheezing.  Musculoskeletal:        General: No swelling. Normal  range of motion.     Cervical back: Neck supple.  Lymphadenopathy:     Cervical: No cervical adenopathy.  Skin:    General: Skin is warm and dry.     Findings: Rash (Patient has a rash on his lower abdomen and he says he also has it in his groin and buttocks but he does not want to show me those.) present.  Neurological:     Mental Status: He is alert and oriented to person, place, and time.     Coordination: Coordination normal.  Psychiatric:        Behavior: Behavior normal.       Assessment & Plan:   Problem List Items Addressed This Visit       Cardiovascular and Mediastinum   Essential hypertension - Primary   Relevant Orders   CBC with Differential/Platelet   CMP14+EGFR   Lipid panel   TSH     Genitourinary   Chronic kidney disease, stage 3a ([place])   Relevant Orders   CBC with Differential/Platelet   CMP14+EGFR   Lipid panel   TSH     Other   Hypercholesterolemia   Relevant Orders   CBC with Differential/Platelet   CMP14+EGFR   Lipid panel   TSH   Other Visit Diagnoses     Fungal dermatitis           Continue current medicine, seems to be doing okay, will check blood work today.  He has gained some weight so we will check her thyroid as well.  On his lower abdomen it looks like an irritant dermatitis versus a fungal dermatitis. It is right above his belt buckle but the way he is describing it in his groin and his buttocks could be fungal as well.  Recommended Lotrimin cream over-the-counter and hydrocortisone cream over-the-counter. Follow up plan: Return in about 6 months (around 02/12/2023), or if symptoms worsen or fail to improve, for Hypertension with CKD and hyperlipidemia.  Counseling provided for all of the vaccine components Orders Placed This Encounter  Procedures   CBC with Differential/Platelet   CMP14+EGFR   Lipid panel   TSH    Caryl Pina, MD [facility] 08/14/2022, 11:22 AM

## 2022-08-15 LAB — CBC WITH DIFFERENTIAL/PLATELET
Basophils Absolute: 0 10*3/uL (ref 0.0–0.2)
Basos: 0 %
EOS (ABSOLUTE): 0.1 10*3/uL (ref 0.0–0.4)
Eos: 2 %
Hematocrit: 41.1 % (ref 37.5–51.0)
Hemoglobin: 13.6 g/dL (ref 13.0–17.7)
Immature Grans (Abs): 0 10*3/uL (ref 0.0–0.1)
Immature Granulocytes: 0 %
Lymphocytes Absolute: 1.5 10*3/uL (ref 0.7–3.1)
Lymphs: 25 %
MCH: 29.4 pg (ref 26.6–33.0)
MCHC: 33.1 g/dL (ref 31.5–35.7)
MCV: 89 fL (ref 79–97)
Monocytes Absolute: 0.5 10*3/uL (ref 0.1–0.9)
Monocytes: 9 %
Neutrophils Absolute: 3.7 10*3/uL (ref 1.4–7.0)
Neutrophils: 64 %
Platelets: 159 10*3/uL (ref 150–450)
RBC: 4.63 x10E6/uL (ref 4.14–5.80)
RDW: 12.3 % (ref 11.6–15.4)
WBC: 5.8 10*3/uL (ref 3.4–10.8)

## 2022-08-15 LAB — LIPID PANEL
Chol/HDL Ratio: 3.5 ratio (ref 0.0–5.0)
Cholesterol, Total: 118 mg/dL (ref 100–199)
HDL: 34 mg/dL — ABNORMAL LOW (ref >39)
LDL Chol Calc (NIH): 62 mg/dL (ref 0–99)
Triglycerides: 122 mg/dL (ref 0–149)
VLDL Cholesterol Cal: 22 mg/dL (ref 5–40)

## 2022-08-15 LAB — CMP14+EGFR
ALT: 16 IU/L (ref 0–44)
AST: 17 IU/L (ref 0–40)
Albumin/Globulin Ratio: 1.9 (ref 1.2–2.2)
Albumin: 4.2 g/dL (ref 3.9–4.9)
Alkaline Phosphatase: 53 IU/L (ref 44–121)
BUN/Creatinine Ratio: 14 (ref 10–24)
BUN: 21 mg/dL (ref 8–27)
Bilirubin Total: 0.4 mg/dL (ref 0.0–1.2)
CO2: 23 mmol/L (ref 20–29)
Calcium: 9 mg/dL (ref 8.6–10.2)
Chloride: 103 mmol/L (ref 96–106)
Creatinine, Ser: 1.46 mg/dL — ABNORMAL HIGH (ref 0.76–1.27)
Globulin, Total: 2.2 g/dL (ref 1.5–4.5)
Glucose: 108 mg/dL — ABNORMAL HIGH (ref 70–99)
Potassium: 4.3 mmol/L (ref 3.5–5.2)
Sodium: 141 mmol/L (ref 134–144)
Total Protein: 6.4 g/dL (ref 6.0–8.5)
eGFR: 52 mL/min/{1.73_m2} — ABNORMAL LOW (ref >59)

## 2022-08-15 LAB — TSH: TSH: 1.73 u[IU]/mL (ref 0.450–4.500)

## 2022-10-01 ENCOUNTER — Other Ambulatory Visit: Payer: Self-pay | Admitting: Cardiology

## 2022-10-01 DIAGNOSIS — I10 Essential (primary) hypertension: Secondary | ICD-10-CM

## 2022-10-05 ENCOUNTER — Other Ambulatory Visit: Payer: Self-pay | Admitting: Cardiology

## 2022-10-05 DIAGNOSIS — I10 Essential (primary) hypertension: Secondary | ICD-10-CM

## 2022-10-05 DIAGNOSIS — E7800 Pure hypercholesterolemia, unspecified: Secondary | ICD-10-CM

## 2022-10-05 NOTE — Progress Notes (Unsigned)
Cardiology Office Note   Date:  10/08/2022   ID:  Nicholas Palmer, DOB 1955/12/16, MRN [id number]  PCP:  Dettinger, Nicholas Kaufmann, MD  Cardiologist:   None   Chief Complaint  Patient presents with   Thoracic Aortic Dissection       History of Present Illness: Nicholas Palmer is a 67 y.o. male who presents for follow up of hypertension and his aortic dissection. 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  His most recent CT to when he had pneumonia late last 2021 demonstrated a stable dissection and a positive PE.  He was treated with Eliquis but developed a rectus sheath hematoma.  His Eliquis was stopped.   Most recent CT was March 2023.  It was a stable appearing thoracic aortic dissection.  Since I last saw him he has done well.  He walks for exercise. The patient denies any new symptoms such as chest discomfort, neck or arm discomfort. There has been no new shortness of breath, PND or orthopnea. There have been no reported palpitations, presyncope or syncope.    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  06/10/10 Dr. Servando Palmer     Current Outpatient Medications  Medication Sig Dispense Refill   aspirin 81 MG tablet Take 1 tablet (81 mg total) by mouth daily.     metoprolol tartrate (LOPRESSOR) 100 MG tablet TAKE 1 TAB TWICE DAILY. IN ADDITION TO '25MG'$  TWICE DAILY. 180 tablet 3   metoprolol tartrate (LOPRESSOR) 25 MG tablet Take 1 tablet  (25 mg total) by mouth 2 (two) times daily. 180 tablet 3   simvastatin (ZOCOR) 20 MG tablet Take 1 tablet (20 mg total) by mouth daily at 6 PM. 90 tablet 3   No current facility-administered medications for this visit.    Allergies:   Codeine and Quinolones    ROS:  Please see the history of present illness.   Otherwise, review of systems are positive for none.   All other systems are reviewed and negative.    PHYSICAL EXAM: VS:  BP 120/80   Pulse 63   Ht '5\' 9"'$  (1.753 m)   Wt 209 lb (94.8 kg)   BMI 30.86 kg/m  , BMI Body mass index is 30.86 kg/m. GENERAL:  Well appearing NECK:  No jugular venous distention, waveform within normal limits, carotid upstroke brisk and symmetric, no bruits, no thyromegaly LUNGS:  Clear to auscultation bilaterally CHEST: Well-healed sternotomy scar HEART:  PMI not displaced or sustained,S1 and S2 within normal limits, no S3, no S4, no clicks, no rubs, no murmurs ABD:  Flat, positive bowel sounds normal in frequency in pitch, no bruits, no rebound, no guarding, no midline pulsatile mass, no hepatomegaly, no splenomegaly EXT:  2 plus pulses throughout, no edema, no cyanosis no clubbing   EKG:  EKG is  ordered today. Sinus rhythm, rate 63, axis within normal limits, intervals  within normal limits, no acute ST-T wave changes.   Recent Labs: 08/14/2022: ALT 16; BUN 21; Creatinine, Ser 1.46; Hemoglobin 13.6; Platelets 159; Potassium 4.3; Sodium 141; TSH 1.730    Lipid Panel    Component Value Date/Time   CHOL 118 08/14/2022 1125   TRIG 122 08/14/2022 1125   HDL 34 (L) 08/14/2022 1125   CHOLHDL 3.5 08/14/2022 1125   CHOLHDL 3.4 06/14/2010 0529   VLDL 18 06/14/2010 0529   LDLCALC 62 08/14/2022 1125      Wt Readings from Last 3 Encounters:  10/08/22 209 lb (94.8 kg)  08/14/22 208 lb (94.3 kg)  08/05/22 205 lb (93 kg)      Other studies Reviewed: Additional studies/ records that were reviewed today include: CT March 2023 Review of the above  records demonstrates:  Please see elsewhere in the note.     ASSESSMENT AND PLAN:  AORTIC DISSECTION REPAIRED:      There is no symptoms and has had stable repair.  No change in therapy.  HTN: His blood pressure is at target.  No change in therapy.    DYSLIPIDEMIA:    LDL was 62.  No change in therapy.  CKD II:  Creat was 1.46.  This will continue to be followed by his primary provider.  It is up slightly from previous.   Current medicines are reviewed at length with the patient today.  The patient does not have concerns regarding medicines.  The following changes have been made:  None  Labs/ tests ordered today include:    Orders Placed This Encounter  Procedures   EKG 12-Lead     Disposition:   FU with me in 12 months  Signed, [name], MD  10/08/2022 12:55 PM    [place]

## 2022-10-08 ENCOUNTER — Ambulatory Visit (INDEPENDENT_AMBULATORY_CARE_PROVIDER_SITE_OTHER): Payer: Medicare Other | Admitting: Cardiology

## 2022-10-08 ENCOUNTER — Encounter: Payer: Self-pay | Admitting: Cardiology

## 2022-10-08 VITALS — BP 120/80 | HR 63 | Ht 69.0 in | Wt 209.0 lb

## 2022-10-08 DIAGNOSIS — E785 Hyperlipidemia, unspecified: Secondary | ICD-10-CM | POA: Diagnosis not present

## 2022-10-08 DIAGNOSIS — E7800 Pure hypercholesterolemia, unspecified: Secondary | ICD-10-CM

## 2022-10-08 DIAGNOSIS — N182 Chronic kidney disease, stage 2 (mild): Secondary | ICD-10-CM | POA: Diagnosis not present

## 2022-10-08 DIAGNOSIS — I10 Essential (primary) hypertension: Secondary | ICD-10-CM

## 2022-10-08 DIAGNOSIS — Z8679 Personal history of other diseases of the circulatory system: Secondary | ICD-10-CM | POA: Diagnosis not present

## 2022-10-08 DIAGNOSIS — Z9889 Other specified postprocedural states: Secondary | ICD-10-CM | POA: Diagnosis not present

## 2022-10-08 MED ORDER — SIMVASTATIN 20 MG PO TABS: 20.0000 mg | ORAL_TABLET | Freq: Every day | ORAL | 3 refills | Status: DC

## 2022-10-08 MED ORDER — METOPROLOL TARTRATE 25 MG PO TABS: 25.0000 mg | ORAL_TABLET | Freq: Two times a day (BID) | ORAL | 3 refills | Status: DC

## 2022-10-08 MED ORDER — METOPROLOL TARTRATE 100 MG PO TABS: ORAL_TABLET | ORAL | 3 refills | Status: DC

## 2022-10-08 NOTE — Patient Instructions (Signed)
Medication Instructions:  The current medical regimen is effective;  continue present plan and medications.  *If you need a refill on your cardiac medications before your next appointment, please call your pharmacy*   Follow-Up: At [facility], you and your health needs are our priority.  As part of our continuing mission to provide you with exceptional heart care, we have created designated Provider Care Teams.  These Care Teams include your primary Cardiologist (physician) and Advanced Practice Providers (APPs -  Physician Assistants and Nurse Practitioners) who all work together to provide you with the care you need, when you need it.  We recommend signing up for the patient portal called "MyChart".  Sign up information is provided on this After Visit Summary.  MyChart is used to connect with patients for Virtual Visits (Telemedicine).  Patients are able to view lab/test results, encounter notes, upcoming appointments, etc.  Non-urgent messages can be sent to your provider as well.   To learn more about what you can do with MyChart, go to [email or web address].    Your next appointment:   1 year(s)  Provider:   Dr Percival Spanish in [place]

## 2023-02-13 ENCOUNTER — Ambulatory Visit (INDEPENDENT_AMBULATORY_CARE_PROVIDER_SITE_OTHER): Payer: Medicare Other | Admitting: Family Medicine

## 2023-02-13 ENCOUNTER — Encounter: Payer: Self-pay | Admitting: Family Medicine

## 2023-02-13 VITALS — BP 131/72 | HR 67 | Ht 69.0 in | Wt 210.0 lb

## 2023-02-13 DIAGNOSIS — E7800 Pure hypercholesterolemia, unspecified: Secondary | ICD-10-CM

## 2023-02-13 DIAGNOSIS — B369 Superficial mycosis, unspecified: Secondary | ICD-10-CM | POA: Diagnosis not present

## 2023-02-13 DIAGNOSIS — N1831 Chronic kidney disease, stage 3a: Secondary | ICD-10-CM | POA: Diagnosis not present

## 2023-02-13 DIAGNOSIS — I129 Hypertensive chronic kidney disease with stage 1 through stage 4 chronic kidney disease, or unspecified chronic kidney disease: Secondary | ICD-10-CM

## 2023-02-13 DIAGNOSIS — I10 Essential (primary) hypertension: Secondary | ICD-10-CM

## 2023-02-13 LAB — CMP14+EGFR
ALT: 18 IU/L (ref 0–44)
AST: 19 IU/L (ref 0–40)
Albumin: 4.3 g/dL (ref 3.9–4.9)
Alkaline Phosphatase: 52 IU/L (ref 44–121)
BUN/Creatinine Ratio: 14 (ref 10–24)
BUN: 22 mg/dL (ref 8–27)
Bilirubin Total: 0.3 mg/dL (ref 0.0–1.2)
CO2: 23 mmol/L (ref 20–29)
Calcium: 9.2 mg/dL (ref 8.6–10.2)
Chloride: 104 mmol/L (ref 96–106)
Creatinine, Ser: 1.54 mg/dL — ABNORMAL HIGH (ref 0.76–1.27)
Globulin, Total: 2.5 g/dL (ref 1.5–4.5)
Glucose: 107 mg/dL — ABNORMAL HIGH (ref 70–99)
Potassium: 4.3 mmol/L (ref 3.5–5.2)
Sodium: 141 mmol/L (ref 134–144)
Total Protein: 6.8 g/dL (ref 6.0–8.5)
eGFR: 49 mL/min/{1.73_m2} — ABNORMAL LOW (ref >59)

## 2023-02-13 LAB — LIPID PANEL
Chol/HDL Ratio: 3.2 ratio (ref 0.0–5.0)
Cholesterol, Total: 121 mg/dL (ref 100–199)
HDL: 38 mg/dL — ABNORMAL LOW (ref >39)
LDL Chol Calc (NIH): 61 mg/dL (ref 0–99)
Triglycerides: 121 mg/dL (ref 0–149)
VLDL Cholesterol Cal: 22 mg/dL (ref 5–40)

## 2023-02-13 LAB — CBC WITH DIFFERENTIAL/PLATELET
Basophils Absolute: 0 10*3/uL (ref 0.0–0.2)
Basos: 0 %
EOS (ABSOLUTE): 0.1 10*3/uL (ref 0.0–0.4)
Eos: 2 %
Hematocrit: 40.5 % (ref 37.5–51.0)
Hemoglobin: 14.4 g/dL (ref 13.0–17.7)
Immature Grans (Abs): 0 10*3/uL (ref 0.0–0.1)
Immature Granulocytes: 0 %
Lymphocytes Absolute: 1.4 10*3/uL (ref 0.7–3.1)
Lymphs: 21 %
MCH: 31.7 pg (ref 26.6–33.0)
MCHC: 35.6 g/dL (ref 31.5–35.7)
MCV: 89 fL (ref 79–97)
Monocytes Absolute: 0.5 10*3/uL (ref 0.1–0.9)
Monocytes: 8 %
Neutrophils Absolute: 4.9 10*3/uL (ref 1.4–7.0)
Neutrophils: 69 %
Platelets: 172 10*3/uL (ref 150–450)
RBC: 4.54 x10E6/uL (ref 4.14–5.80)
RDW: 12.1 % (ref 11.6–15.4)
WBC: 7 10*3/uL (ref 3.4–10.8)

## 2023-02-13 MED ORDER — TERBINAFINE HCL 1 % EX CREA: 1.0000 | TOPICAL_CREAM | Freq: Two times a day (BID) | CUTANEOUS | 2 refills | Status: DC

## 2023-02-13 MED ORDER — FLUCONAZOLE 150 MG PO TABS: 150.0000 mg | ORAL_TABLET | Freq: Once | ORAL | 0 refills | Status: AC

## 2023-02-13 NOTE — Progress Notes (Signed)
BP 131/72   Pulse 67   Ht 5\' 9"  (1.753 m)   Wt 210 lb (95.3 kg)   SpO2 96%   BMI 31.01 kg/m    Subjective:   Patient ID: Nicholas Palmer, male    DOB: 04-17-1956, 67 y.o.   MRN: [id number]  HPI: Nicholas Palmer is a 67 y.o. male presenting on 02/13/2023 for Medical Management of Chronic Issues, Hypertension, and Foot Swelling (Bilateral, red)   HPI Hypertension and CKD Patient is currently on metoprolol, and their blood pressure today is 131/72. Patient denies any lightheadedness or dizziness. Patient denies headaches, blurred vision, chest pains, shortness of breath, or weakness. Denies any side effects from medication and is content with current medication.   Hyperlipidemia Patient is coming in for recheck of his hyperlipidemia. The patient is currently taking simvastatin. They deny any issues with myalgias or history of liver damage from it. They deny any focal numbness or weakness or chest pain.   Patient has a rash on the top of his feet and mainly it healed on the right foot but still has on the top of his left foot.  He said it started about a month ago after he took a shower and does not know if he had a chemical irritation or not.  He has been putting Vaseline on it and it does feel the top of the 1 foot on his right foot is healed but the top of the left foot has not.  He denies any fevers or chills.  It is slightly irritated but not painful.  He denies any rash anywhere else.  Relevant past medical, surgical, family and social history reviewed and updated as indicated. Interim medical history since our last visit reviewed. Allergies and medications reviewed and updated.  Review of Systems  Constitutional:  Negative for chills and fever.  Eyes:  Negative for visual disturbance.  Respiratory:  Negative for shortness of breath and wheezing.   Cardiovascular:  Negative for chest pain and leg swelling.  Musculoskeletal:  Negative for back pain and gait problem.  Skin:  Positive  for rash.  All other systems reviewed and are negative.   Per HPI unless specifically indicated above   Allergies as of 02/13/2023       Reactions   Codeine Nausea And Vomiting   Quinolones    Patient was warned about not using Cipro and similar antibiotics. Recent studies have raised concern that fluoroquinolone antibiotics could be associated with an increased risk of aortic aneurysm Fluoroquinolones have non-antimicrobial properties that might jeopardise the integrity of the extracellular matrix of the vascular wall In a  propensity score matched cohort study in [place], there was a 66% increased rate of aortic aneurysm or dissection associated with oral fluoroquinolone use, compared wit        Medication List        Accurate as of February 13, 2023 10:36 AM. If you have any questions, ask your nurse or doctor.          aspirin 81 MG tablet Take 1 tablet (81 mg total) by mouth daily.   fluconazole 150 MG tablet Commonly known as: Diflucan Take 1 tablet (150 mg total) by mouth once for 1 dose. Started by: Nils Pyle, MD   metoprolol tartrate 25 MG tablet Commonly known as: LOPRESSOR Take 1 tablet (25 mg total) by mouth 2 (two) times daily.   metoprolol tartrate 100 MG tablet Commonly known as: LOPRESSOR TAKE 1 TAB TWICE DAILY.  IN ADDITION TO 25MG  TWICE DAILY.   simvastatin 20 MG tablet Commonly known as: ZOCOR Take 1 tablet (20 mg total) by mouth daily at 6 PM.   terbinafine 1 % cream Commonly known as: LamISIL AT Apply 1 Application topically 2 (two) times daily. Started by: Elige Radon Savahna Casados, MD         Objective:   BP 131/72   Pulse 67   Ht 5\' 9"  (1.753 m)   Wt 210 lb (95.3 kg)   SpO2 96%   BMI 31.01 kg/m   Wt Readings from Last 3 Encounters:  02/13/23 210 lb (95.3 kg)  10/08/22 209 lb (94.8 kg)  08/14/22 208 lb (94.3 kg)    Physical Exam Vitals and nursing note reviewed.  Constitutional:      General: He is not in acute distress.     Appearance: He is well-developed. He is not diaphoretic.  Eyes:     General: No scleral icterus.       Right eye: No discharge.     Conjunctiva/sclera: Conjunctivae normal.     Pupils: Pupils are equal, round, and reactive to light.  Neck:     Thyroid: No thyromegaly.  Cardiovascular:     Rate and Rhythm: Normal rate and regular rhythm.     Heart sounds: Normal heart sounds. No murmur heard. Pulmonary:     Effort: Pulmonary effort is normal. No respiratory distress.     Breath sounds: Normal breath sounds. No wheezing.  Musculoskeletal:        General: Normal range of motion.     Cervical back: Neck supple.  Lymphadenopathy:     Cervical: No cervical adenopathy.  Skin:    General: Skin is warm and dry.     Findings: Rash present.       Neurological:     Mental Status: He is alert and oriented to person, place, and time.     Coordination: Coordination normal.  Psychiatric:        Behavior: Behavior normal.       Assessment & Plan:   Problem List Items Addressed This Visit       Cardiovascular and Mediastinum   Essential hypertension - Primary   Relevant Orders   CBC with Differential/Platelet   CMP14+EGFR     Genitourinary   Chronic kidney disease, stage 3a ([facility])   Relevant Orders   CBC with Differential/Platelet   CMP14+EGFR     Other   Hypercholesterolemia   Relevant Orders   Lipid panel   Other Visit Diagnoses     Fungal dermatitis       Relevant Medications   fluconazole (DIFLUCAN) 150 MG tablet   terbinafine (LAMISIL AT) 1 % cream     Spent time discussing regarding chronic care diseases  Will treat like possible fungal dermatitis, it also could be contact or irritant dermatitis and is putting Vaseline on it every day would continue with.  Maybe try changing shoes as well  Follow up plan: Return in about 6 months (around 08/16/2023), or if symptoms worsen or fail to improve, for Hypertension and CKD.  Counseling provided for all of the vaccine  components Orders Placed This Encounter  Procedures   CBC with Differential/Platelet   CMP14+EGFR   Lipid panel    Arville Care, MD [facility] 02/13/2023, 10:36 AM

## 2023-08-17 ENCOUNTER — Ambulatory Visit: Payer: Medicare Other | Admitting: Family Medicine

## 2023-08-21 ENCOUNTER — Ambulatory Visit (INDEPENDENT_AMBULATORY_CARE_PROVIDER_SITE_OTHER): Payer: Medicare Other

## 2023-08-21 VITALS — Ht 69.0 in | Wt 210.0 lb

## 2023-08-21 DIAGNOSIS — Z0000 Encounter for general adult medical examination without abnormal findings: Secondary | ICD-10-CM

## 2023-08-21 NOTE — Patient Instructions (Signed)
 [name] , Thank you for taking time to come for your Medicare Wellness Visit. I appreciate your ongoing commitment to your health goals. Please review the following plan we discussed and let me know if I can assist you in the future.   Referrals/Orders/Follow-Ups/Clinician Recommendations: Aim for 30 minutes of exercise or brisk walking, 6-8 glasses of water, and 5 servings of fruits and vegetables each day.  This is a list of the screening recommended for you and due dates:  Health Maintenance  Topic Date Due   DTaP/Tdap/Td vaccine (1 - Tdap) Never done   Zoster (Shingles) Vaccine (1 of 2) Never done   COVID-19 Vaccine (1 - 2024-25 season) Never done   Flu Shot  11/09/2023*   Pneumonia Vaccine (1 of 2 - PCV) 02/13/2024*   Colon Cancer Screening  02/13/2024*   Hepatitis C Screening  02/13/2024*   Medicare Annual Wellness Visit  08/20/2024   HPV Vaccine  Aged Out  *Topic was postponed. The date shown is not the original due date.    Advanced directives: (ACP Link)Information on Advanced Care Planning can be found at [place]  Secretary of [facility] Advance Health Care Directives Advance Health Care Directives ([email or web address]   Next Medicare Annual Wellness Visit scheduled for next year: Yes

## 2023-08-21 NOTE — Progress Notes (Signed)
 Subjective:   Nicholas Palmer is a 68 y.o. male who presents for Medicare Annual/Subsequent preventive examination.  Visit Complete: Virtual I connected with  Nicholas Palmer on 08/21/23 by a audio enabled telemedicine application and verified that I am speaking with the correct person using two identifiers.  Patient Location: Home  Provider Location: Home Office  This patient declined Interactive audio and video telecommunications. Therefore the visit was completed with audio only.   I discussed the limitations of evaluation and management by telemedicine. The patient expressed understanding and agreed to proceed.  Vital Signs: Because this visit was a virtual/telehealth visit, some criteria may be missing or patient reported. Any vitals not documented were not able to be obtained and vitals that have been documented are patient reported.  Cardiac Risk Factors include: advanced age (>30men, >61 women);hypertension;male gender;dyslipidemia     Objective:    Today's Vitals   08/21/23 1128  Weight: 210 lb (95.3 kg)  Height: 5' 9 (1.753 m)   Body mass index is 31.01 kg/m.     08/21/2023   11:35 AM 08/05/2022    2:50 PM 06/24/2021    2:47 PM 08/09/2020    1:47 AM 08/08/2020    2:45 AM  Advanced Directives  Does Patient Have a Medical Advance Directive? No No No  No  Would patient like information on creating a medical advance directive? Yes (MAU/Ambulatory/Procedural Areas - Information given) No - Patient declined No - Patient declined No - Patient declined     Current Medications (verified) Outpatient Encounter Medications as of 08/21/2023  Medication Sig   aspirin  81 MG tablet Take 1 tablet (81 mg total) by mouth daily.   metoprolol  tartrate (LOPRESSOR ) 100 MG tablet TAKE 1 TAB TWICE DAILY. IN ADDITION TO 25MG  TWICE DAILY.   metoprolol  tartrate (LOPRESSOR ) 25 MG tablet Take 1 tablet (25 mg total) by mouth 2 (two) times daily.   simvastatin  (ZOCOR ) 20 MG tablet Take 1  tablet (20 mg total) by mouth daily at 6 PM.   terbinafine  (LAMISIL  AT) 1 % cream Apply 1 Application topically 2 (two) times daily.   No facility-administered encounter medications on file as of 08/21/2023.    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  06/10/10 Dr. Army   Family History  Problem Relation Age of Onset   Lung disease Mother        copd   Heart disease Father    Heart disease Brother    Kidney disease Sister    Stroke Sister    Social History   Socioeconomic History   Marital status: Legally Separated    Spouse name: Not on file   Number of children: Not on file   Years of education: Not on file   Highest education level: Not on file  Occupational History   Not on file  Tobacco Use  Smoking status: Former    Current packs/day: 0.00    Types: Cigarettes    Quit date: 08/11/2002    Years since quitting: 21.0   Smokeless tobacco: Never  Vaping Use   Vaping status: Never Used  Substance and Sexual Activity   Alcohol use: No    Alcohol/week: 0.0 standard drinks of alcohol   Drug use: No   Sexual activity: Not Currently    Comment: separated, no kids  Other Topics Concern   Not on file  Social History Narrative   Has 20-pack-year history of tobacco abuse, quitting many years ago. Denies alcohol or drugs. Does not routinely exercise    Social Drivers of Health   Financial  Resource Strain: Low Risk  (08/21/2023)   Overall Financial Resource Strain (CARDIA)    Difficulty of Paying Living Expenses: Not hard at all  Food Insecurity: No Food Insecurity (08/21/2023)   Hunger Vital Sign    Worried About Running Out of Food in the Last Year: Never true    Ran Out of Food in the Last Year: Never true  Transportation Needs: No Transportation Needs (08/21/2023)   PRAPARE - Administrator, Civil Service (Medical): No    Lack of Transportation (Non-Medical): No  Physical Activity: Insufficiently Active (08/21/2023)   Exercise Vital Sign    Days of Exercise per Week: 3 days    Minutes of Exercise per Session: 30 min  Stress: No Stress Concern Present (08/21/2023)   [employer] of Occupational Health - Occupational Stress Questionnaire    Feeling of Stress : Not at all  Social Connections: Socially Isolated (08/21/2023)   Social Connection and Isolation Panel [NHANES]    Frequency of Communication with Friends and Family: More than three times a week    Frequency of Social Gatherings with Friends and Family: Three times a week    Attends Religious Services: Never    Active Member of Clubs or Organizations: No    Attends Engineer, Structural: Never    Marital Status: Divorced    Tobacco Counseling Counseling given: Not Answered   Clinical Intake:  Pre-visit preparation completed: Yes  Pain : No/denies pain     Diabetes: No  How often do you need to have someone help you when you read instructions, pamphlets, or other written materials from your doctor or pharmacy?: 1 - Never  Interpreter Needed?: No  Information entered by :: Charmaine Bloodgood LPN   Activities of Daily Living    08/21/2023   11:29 AM  In your present state of health, do you have any difficulty performing the following activities:  Hearing? 0  Vision? 0  Difficulty concentrating or making decisions? 0  Walking or climbing stairs? 0  Dressing or bathing? 0   Doing errands, shopping? 0  Preparing Food and eating ? N  Using the Toilet? N  In the past six months, have you accidently leaked urine? N  Do you have problems with loss of bowel control? N  Managing your Medications? N  Managing your Finances? N  Housekeeping or managing your Housekeeping? N    Patient Care Team: Dettinger, Fonda LABOR, MD as PCP - General (Family Medicine) Lavona Agent, MD as Attending Physician (Cardiology)  Indicate any recent Medical Services you may have received from other than [facility] providers in the past year (date may be approximate).     Assessment:   This is a routine wellness examination for Gio.  Hearing/Vision screen Hearing Screening - Comments::  Denies hearing difficulties   Vision Screening - Comments:: No vision problems; will schedule routine eye exam soon     Goals Addressed             This Visit's Progress    COMPLETED: Have 3 meals a day       Eat 3 meals daily that consist of lean proteins fruits and vegetables.       Depression Screen    08/21/2023   11:34 AM 02/13/2023   10:07 AM 08/14/2022   11:06 AM 08/05/2022    2:49 PM 02/10/2022    8:47 AM 06/24/2021    2:43 PM 05/21/2021   10:26 AM  PHQ 2/9 Scores  PHQ - 2 Score 0 0 0 0 0 0 0  PHQ- 9 Score  0 0  0      Fall Risk    08/21/2023   11:37 AM 02/13/2023   10:07 AM 08/14/2022   11:05 AM 08/05/2022    2:47 PM 02/10/2022    8:40 AM  Fall Risk   Falls in the past year? 0 0 0 0 0  Number falls in past yr: 0   0   Injury with Fall? 0   0   Risk for fall due to : No Fall Risks   No Fall Risks   Follow up Falls prevention discussed;Education provided;Falls evaluation completed   Falls prevention discussed     MEDICARE RISK AT HOME: Medicare Risk at Home Any stairs in or around the home?: No If so, are there any without handrails?: No Home free of loose throw rugs in walkways, pet beds, electrical cords, etc?: Yes Adequate lighting in your home to reduce risk of  falls?: Yes Life alert?: No Use of a cane, walker or w/c?: No Grab bars in the bathroom?: Yes Shower chair or bench in shower?: No Elevated toilet seat or a handicapped toilet?: Yes  TIMED UP AND GO:  Was the test performed?  No    Cognitive Function:    11/24/2017   10:39 AM  MMSE - Mini Mental State Exam  Orientation to time 5  Orientation to Place 5  Registration 3  Attention/ Calculation 5  Recall 3  Language- name 2 objects 2  Language- repeat 1  Language- follow 3 step command 3  Language- read & follow direction 1  Write a sentence 1  Copy design 1  Total score 30        08/21/2023   11:38 AM 08/05/2022    2:50 PM 06/24/2021    2:50 PM  6CIT Screen  What Year? 0 points 0 points 0 points  What month? 0 points 0 points 0 points  What time? 0 points 0 points 0 points  Count back from 20 0 points 0 points 0 points  Months in reverse 0 points 0 points 0 points  Repeat phrase 0 points 0 points 0 points  Total Score 0 points 0 points 0 points    Immunizations  There is no immunization history on file for this patient.  TDAP status: Due, Education has been provided regarding the importance of this vaccine. Advised may receive this vaccine at local pharmacy or Health Dept. Aware to provide a copy of the vaccination record if obtained from local pharmacy or Health Dept. Verbalized acceptance and understanding.  Flu Vaccine status: Declined, Education has been provided regarding the importance of this vaccine but patient still declined. Advised may receive this vaccine at local pharmacy or Health  Dept. Aware to provide a copy of the vaccination record if obtained from local pharmacy or Health Dept. Verbalized acceptance and understanding.  Pneumococcal vaccine status: Declined,  Education has been provided regarding the importance of this vaccine but patient still declined. Advised may receive this vaccine at local pharmacy or Health Dept. Aware to provide a copy of the  vaccination record if obtained from local pharmacy or Health Dept. Verbalized acceptance and understanding.   Covid-19 vaccine status: Declined, Education has been provided regarding the importance of this vaccine but patient still declined. Advised may receive this vaccine at local pharmacy or Health Dept.or vaccine clinic. Aware to provide a copy of the vaccination record if obtained from local pharmacy or Health Dept. Verbalized acceptance and understanding.  Qualifies for Shingles Vaccine? Yes   Zostavax completed No   Shingrix Completed?: No.    Education has been provided regarding the importance of this vaccine. Patient has been advised to call insurance company to determine out of pocket expense if they have not yet received this vaccine. Advised may also receive vaccine at local pharmacy or Health Dept. Verbalized acceptance and understanding.  Screening Tests Health Maintenance  Topic Date Due   DTaP/Tdap/Td (1 - Tdap) Never done   Zoster Vaccines- Shingrix (1 of 2) Never done   COVID-19 Vaccine (1 - 2024-25 season) Never done   INFLUENZA VACCINE  11/09/2023 (Originally 03/12/2023)   Pneumonia Vaccine 86+ Years old (1 of 2 - PCV) 02/13/2024 (Originally 10/16/1960)   Colonoscopy  02/13/2024 (Originally 10/17/1999)   Hepatitis C Screening  02/13/2024 (Originally 10/16/1972)   Medicare Annual Wellness (AWV)  08/20/2024   HPV VACCINES  Aged Out    Health Maintenance  Health Maintenance Due  Topic Date Due   DTaP/Tdap/Td (1 - Tdap) Never done   Zoster Vaccines- Shingrix (1 of 2) Never done   COVID-19 Vaccine (1 - 2024-25 season) Never done    Colorectal cancer screening:  Patient declines at this time   Lung Cancer Screening: (Low Dose CT Chest recommended if Age 37-80 years, 20 pack-year currently smoking OR have quit w/in 15years.) does not qualify.   Lung Cancer Screening Referral: n/a  Additional Screening:  Hepatitis C Screening: does qualify  Vision Screening: Recommended  annual ophthalmology exams for early detection of glaucoma and other disorders of the eye. Is the patient up to date with their annual eye exam?  No  Who is the provider or what is the name of the office in which the patient attends annual eye exams? none If pt is not established with a provider, would they like to be referred to a provider to establish care? No .   Dental Screening: Recommended annual dental exams for proper oral hygiene  Community Resource Referral / Chronic Care Management: CRR required this visit?  No   CCM required this visit?  No     Plan:     I have personally reviewed and noted the following in the patient's chart:   Medical and social history Use of alcohol, tobacco or illicit drugs  Current medications and supplements including opioid prescriptions. Patient is not currently taking opioid prescriptions. Functional ability and status Nutritional status Physical activity Advanced directives List of other physicians Hospitalizations, surgeries, and ER visits in previous 12 months Vitals Screenings to include cognitive, depression, and falls Referrals and appointments  In addition, I have reviewed and discussed with patient certain preventive protocols, quality metrics, and best practice recommendations. A written personalized care plan for preventive  services as well as general preventive health recommendations were provided to patient.     Lavelle Pfeiffer [place], [place]   8/89/7974   After Visit Summary: (Mail) Due to this being a telephonic visit, the after visit summary with patients personalized plan was offered to patient via mail   Nurse Notes: No concerns at this time

## 2023-08-24 ENCOUNTER — Ambulatory Visit: Payer: Medicare Other | Admitting: Family Medicine

## 2023-09-09 ENCOUNTER — Ambulatory Visit: Payer: Medicare Other | Admitting: Family Medicine

## 2023-09-24 ENCOUNTER — Ambulatory Visit: Payer: Medicare Other | Admitting: Family Medicine

## 2023-09-24 ENCOUNTER — Encounter: Payer: Self-pay | Admitting: Family Medicine

## 2023-09-25 ENCOUNTER — Other Ambulatory Visit: Payer: Self-pay | Admitting: Cardiology

## 2023-09-25 DIAGNOSIS — I10 Essential (primary) hypertension: Secondary | ICD-10-CM

## 2023-10-18 DIAGNOSIS — N182 Chronic kidney disease, stage 2 (mild): Secondary | ICD-10-CM | POA: Insufficient documentation

## 2023-10-18 NOTE — Progress Notes (Unsigned)
  Cardiology Office Note:   Date:  10/18/2023  ID:  Nicholas Palmer, DOB Oct 28, 1955, MRN [id number] PCP: Dettinger, Elige Radon, MD  [facility] Providers Cardiologist:  None {  History of Present Illness:   Nicholas Palmer is a 68 y.o. male who presents for follow up of hypertension and his aortic dissection. 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  His most recent CT to when he had pneumonia late last 2021 demonstrated a stable dissection and a positive PE.  He was treated with Eliquis but developed a rectus sheath hematoma.  His Eliquis was stopped.   Most recent CT was March 2023.  It was a stable appearing thoracic aortic dissection.   Since I last saw him ***  *** he has done well.  He walks for exercise. The patient denies any new symptoms such as chest discomfort, neck or arm discomfort. There has been no new shortness of breath, PND or orthopnea. There have been no reported palpitations, presyncope or syncope.   ROS: ***  Studies Reviewed:    EKG:       ***  Risk Assessment/Calculations:   {Does this patient have ATRIAL FIBRILLATION?[phone]} No BP recorded.  {Refresh Note OR Click here to enter BP  :1}***        Physical Exam:   VS:  There were no vitals taken for this visit.   Wt Readings from Last 3 Encounters:  08/21/23 210 lb (95.3 kg)  02/13/23 210 lb (95.3 kg)  10/08/22 209 lb (94.8 kg)     GEN: Well nourished, well developed in no acute distress NECK: No JVD; No carotid bruits CARDIAC: ***RR, *** murmurs, rubs, gallops RESPIRATORY:  Clear to auscultation without rales, wheezing or rhonchi  ABDOMEN: Soft, non-tender, non-distended EXTREMITIES:  No edema; No deformity   ASSESSMENT AND PLAN:   AORTIC DISSECTION REPAIRED:    There was stable anatomy on CT in March 2023.  ***  There is no symptoms and has had stable repair.  No change in therapy.   HTN: His blood pressure is ***  at target.  No change in therapy.     DYSLIPIDEMIA:     LDL was *** 62.  No change in therapy.   CKD II:  Creat was *** 1.46.  This will continue to be followed by his primary provider.  It is up slightly from previous.      Follow up ***  Signed, Rollene Rotunda, MD

## 2023-10-21 ENCOUNTER — Encounter: Payer: Self-pay | Admitting: Cardiology

## 2023-10-21 ENCOUNTER — Ambulatory Visit (INDEPENDENT_AMBULATORY_CARE_PROVIDER_SITE_OTHER): Payer: Medicare Other | Admitting: Cardiology

## 2023-10-21 VITALS — BP 126/74 | HR 73 | Ht 69.0 in | Wt 203.0 lb

## 2023-10-21 DIAGNOSIS — E785 Hyperlipidemia, unspecified: Secondary | ICD-10-CM

## 2023-10-21 DIAGNOSIS — I10 Essential (primary) hypertension: Secondary | ICD-10-CM | POA: Diagnosis not present

## 2023-10-21 DIAGNOSIS — Z9889 Other specified postprocedural states: Secondary | ICD-10-CM

## 2023-10-21 DIAGNOSIS — N182 Chronic kidney disease, stage 2 (mild): Secondary | ICD-10-CM | POA: Diagnosis not present

## 2023-10-21 NOTE — Patient Instructions (Signed)

## 2023-11-02 ENCOUNTER — Other Ambulatory Visit: Payer: Self-pay | Admitting: Cardiology

## 2023-11-02 DIAGNOSIS — I10 Essential (primary) hypertension: Secondary | ICD-10-CM

## 2023-11-06 ENCOUNTER — Ambulatory Visit: Payer: Medicare Other | Admitting: Family Medicine

## 2023-11-06 ENCOUNTER — Encounter: Payer: Self-pay | Admitting: Family Medicine

## 2023-11-06 VITALS — BP 136/77 | HR 73 | Ht 69.0 in | Wt 210.0 lb

## 2023-11-06 DIAGNOSIS — N1831 Chronic kidney disease, stage 3a: Secondary | ICD-10-CM

## 2023-11-06 DIAGNOSIS — E7800 Pure hypercholesterolemia, unspecified: Secondary | ICD-10-CM | POA: Diagnosis not present

## 2023-11-06 DIAGNOSIS — I10 Essential (primary) hypertension: Secondary | ICD-10-CM | POA: Diagnosis not present

## 2023-11-06 DIAGNOSIS — B353 Tinea pedis: Secondary | ICD-10-CM | POA: Diagnosis not present

## 2023-11-06 LAB — CMP14+EGFR
ALT: 17 IU/L (ref 0–44)
AST: 20 IU/L (ref 0–40)
Albumin: 4.1 g/dL (ref 3.9–4.9)
Alkaline Phosphatase: 59 IU/L (ref 44–121)
BUN/Creatinine Ratio: 13 (ref 10–24)
BUN: 17 mg/dL (ref 8–27)
Bilirubin Total: 0.3 mg/dL (ref 0.0–1.2)
CO2: 24 mmol/L (ref 20–29)
Calcium: 8.9 mg/dL (ref 8.6–10.2)
Chloride: 104 mmol/L (ref 96–106)
Creatinine, Ser: 1.35 mg/dL — ABNORMAL HIGH (ref 0.76–1.27)
Globulin, Total: 2.6 g/dL (ref 1.5–4.5)
Glucose: 101 mg/dL — ABNORMAL HIGH (ref 70–99)
Potassium: 4.4 mmol/L (ref 3.5–5.2)
Sodium: 140 mmol/L (ref 134–144)
Total Protein: 6.7 g/dL (ref 6.0–8.5)
eGFR: 57 mL/min/{1.73_m2} — ABNORMAL LOW (ref >59)

## 2023-11-06 LAB — CBC WITH DIFFERENTIAL/PLATELET
Basophils Absolute: 0 10*3/uL (ref 0.0–0.2)
Basos: 0 %
EOS (ABSOLUTE): 0.1 10*3/uL (ref 0.0–0.4)
Eos: 2 %
Hematocrit: 40.4 % (ref 37.5–51.0)
Hemoglobin: 13.2 g/dL (ref 13.0–17.7)
Immature Grans (Abs): 0 10*3/uL (ref 0.0–0.1)
Immature Granulocytes: 0 %
Lymphocytes Absolute: 1.4 10*3/uL (ref 0.7–3.1)
Lymphs: 20 %
MCH: 29.7 pg (ref 26.6–33.0)
MCHC: 32.7 g/dL (ref 31.5–35.7)
MCV: 91 fL (ref 79–97)
Monocytes Absolute: 0.5 10*3/uL (ref 0.1–0.9)
Monocytes: 7 %
Neutrophils Absolute: 5.2 10*3/uL (ref 1.4–7.0)
Neutrophils: 71 %
Platelets: 160 10*3/uL (ref 150–450)
RBC: 4.45 x10E6/uL (ref 4.14–5.80)
RDW: 12.6 % (ref 11.6–15.4)
WBC: 7.2 10*3/uL (ref 3.4–10.8)

## 2023-11-06 LAB — LIPID PANEL
Chol/HDL Ratio: 3.3 ratio (ref 0.0–5.0)
Cholesterol, Total: 124 mg/dL (ref 100–199)
HDL: 38 mg/dL — ABNORMAL LOW (ref >39)
LDL Chol Calc (NIH): 66 mg/dL (ref 0–99)
Triglycerides: 110 mg/dL (ref 0–149)
VLDL Cholesterol Cal: 20 mg/dL (ref 5–40)

## 2023-11-06 MED ORDER — FLUCONAZOLE 150 MG PO TABS: 150.0000 mg | ORAL_TABLET | ORAL | 0 refills | Status: AC

## 2023-11-06 MED ORDER — TOLNAFTATE 1 % EX POWD: 1.0000 | Freq: Two times a day (BID) | CUTANEOUS | 0 refills | Status: AC

## 2023-11-06 NOTE — Progress Notes (Signed)
 BP 136/77   Pulse 73   Ht 5\' 9"  (1.753 m)   Wt 210 lb (95.3 kg)   SpO2 99%   BMI 31.01 kg/m    Subjective:   Patient ID: Nicholas Palmer, male    DOB: February 08, 1956, 68 y.o.   MRN: [id number]  HPI: Nicholas Palmer is a 68 y.o. male presenting on 11/06/2023 for Medical Management of Chronic Issues, Hypertension, and Foot Problem (Not improving with OTC athlete foot meds)   HPI Hypertension and CKD recheck Patient is currently on metoprolol, and their blood pressure today is 136/77. Patient denies any lightheadedness or dizziness. Patient denies headaches, blurred vision, chest pains, shortness of breath, or weakness. Denies any side effects from medication and is content with current medication.   Hyperlipidemia Patient is coming in for recheck of his hyperlipidemia. The patient is currently taking simvastatin. They deny any issues with myalgias or history of liver damage from it. They deny any focal numbness or weakness or chest pain.   Feet problem  Patient has significant athlete's foot issues on both of his feet.  He has tried 2 cans of the Lotrimin spray and he felt like it did not make a difference.  He he says he started to get some of the calluses peeling off as well.  He has been using some Vaseline on them to try and help moisturize.  Relevant past medical, surgical, family and social history reviewed and updated as indicated. Interim medical history since our last visit reviewed. Allergies and medications reviewed and updated.  Review of Systems  Constitutional:  Negative for chills and fever.  Eyes:  Negative for visual disturbance.  Respiratory:  Negative for shortness of breath and wheezing.   Cardiovascular:  Negative for chest pain and leg swelling.  Musculoskeletal:  Negative for back pain and gait problem.  Skin:  Positive for color change, rash and wound.  Neurological:  Negative for dizziness and light-headedness.  All other systems reviewed and are  negative.   Per HPI unless specifically indicated above   Allergies as of 11/06/2023       Reactions   Codeine Nausea And Vomiting   Quinolones    Patient was warned about not using Cipro and similar antibiotics. Recent studies have raised concern that fluoroquinolone antibiotics could be associated with an increased risk of aortic aneurysm Fluoroquinolones have non-antimicrobial properties that might jeopardise the integrity of the extracellular matrix of the vascular wall In a  propensity score matched cohort study in [place], there was a 66% increased rate of aortic aneurysm or dissection associated with oral fluoroquinolone use, compared wit        Medication List        Accurate as of November 06, 2023 10:11 AM. If you have any questions, ask your nurse or doctor.          aspirin 81 MG tablet Take 1 tablet (81 mg total) by mouth daily.   fluconazole 150 MG tablet Commonly known as: Diflucan Take 1 tablet (150 mg total) by mouth once a week. Started by: Elige Radon Jennessy Sandridge   metoprolol tartrate 25 MG tablet Commonly known as: LOPRESSOR TAKE 1 TABLET BY MOUTH TWICE A DAY   metoprolol tartrate 100 MG tablet Commonly known as: LOPRESSOR TAKE 1 TAB TWICE DAILY. IN ADDITION TO 25MG  TWICE DAILY.   simvastatin 20 MG tablet Commonly known as: ZOCOR Take 1 tablet (20 mg total) by mouth daily at 6 PM.   tolnaftate 1 %  powder Commonly known as: Lotrimin AF Apply 1 Application topically 2 (two) times daily. Started by: Elige Radon Joelee Snoke         Objective:   BP 136/77   Pulse 73   Ht 5\' 9"  (1.753 m)   Wt 210 lb (95.3 kg)   SpO2 99%   BMI 31.01 kg/m   Wt Readings from Last 3 Encounters:  11/06/23 210 lb (95.3 kg)  10/21/23 203 lb (92.1 kg)  08/21/23 210 lb (95.3 kg)    Physical Exam Vitals and nursing note reviewed.  Constitutional:      General: He is not in acute distress.    Appearance: He is well-developed. He is not diaphoretic.  Eyes:      General: No scleral icterus.       Right eye: No discharge.     Conjunctiva/sclera: Conjunctivae normal.     Pupils: Pupils are equal, round, and reactive to light.  Neck:     Thyroid: No thyromegaly.  Cardiovascular:     Rate and Rhythm: Normal rate and regular rhythm.     Heart sounds: Normal heart sounds. No murmur heard. Pulmonary:     Effort: Pulmonary effort is normal. No respiratory distress.     Breath sounds: Normal breath sounds. No wheezing.  Musculoskeletal:        General: Normal range of motion.     Cervical back: Neck supple.  Lymphadenopathy:     Cervical: No cervical adenopathy.  Skin:    General: Skin is warm and dry.     Findings: Rash (Erythema and cracking and peeling on both of his feet, has an open area where calluses fallen off on the pad of his right foot) present.  Neurological:     Mental Status: He is alert and oriented to person, place, and time.     Coordination: Coordination normal.  Psychiatric:        Behavior: Behavior normal.       Assessment & Plan:   Problem List Items Addressed This Visit       Cardiovascular and Mediastinum   Essential hypertension - Primary   Relevant Orders   CBC with Differential/Platelet     Genitourinary   Chronic kidney disease, stage 3a (HCC)   Relevant Orders   CBC with Differential/Platelet   CMP14+EGFR     Other   Hypercholesterolemia   Relevant Orders   CMP14+EGFR   Lipid panel   Other Visit Diagnoses       Tinea pedis of both feet       Relevant Medications   tolnaftate (LOTRIMIN AF) 1 % powder   fluconazole (DIFLUCAN) 150 MG tablet   Other Relevant Orders   Ambulatory referral to Podiatry       Will refer to podiatry but also gave 1 regiment of Diflucan along with topical Lotrimin powder Follow up plan: Return in about 6 months (around 05/08/2024), or if symptoms worsen or fail to improve, for Hypertension and cholesterol.  Counseling provided for all of the vaccine  components Orders Placed This Encounter  Procedures   CBC with Differential/Platelet   CMP14+EGFR   Lipid panel   Ambulatory referral to Podiatry    Arville Care, MD [facility] 11/06/2023, 10:11 AM

## 2024-01-27 ENCOUNTER — Telehealth: Payer: Self-pay | Admitting: Cardiology

## 2024-01-27 DIAGNOSIS — E7800 Pure hypercholesterolemia, unspecified: Secondary | ICD-10-CM

## 2024-01-27 DIAGNOSIS — I10 Essential (primary) hypertension: Secondary | ICD-10-CM

## 2024-01-27 MED ORDER — METOPROLOL TARTRATE 100 MG PO TABS
ORAL_TABLET | ORAL | 2 refills | Status: AC
Start: 2024-01-27 — End: ?

## 2024-01-27 MED ORDER — SIMVASTATIN 20 MG PO TABS: 20.0000 mg | ORAL_TABLET | Freq: Every day | ORAL | 2 refills | Status: AC

## 2024-01-27 MED ORDER — METOPROLOL TARTRATE 25 MG PO TABS: 25.0000 mg | ORAL_TABLET | Freq: Two times a day (BID) | ORAL | 2 refills | Status: AC

## 2024-01-27 NOTE — Telephone Encounter (Signed)
 Pt's medications were sent to pt's pharmacy as requested. Confirmation received.

## 2024-01-27 NOTE — Telephone Encounter (Signed)
*  STAT* If patient is at the pharmacy, call can be transferred to refill team.   1. Which medications need to be refilled? (please list name of each medication and dose if known) metoprolol  tartrate (LOPRESSOR ) 25 MG tablet   simvastatin  (ZOCOR ) 20 MG tablet   2. Which pharmacy/location (including street and city if local pharmacy) is medication to be sent to? [id number] - [place], [place] - [address] Phone: [phone]  Fax: [phone]     3. Do they need a 30 day or 90 day supply? 90

## 2024-05-11 ENCOUNTER — Ambulatory Visit: Admitting: Family Medicine

## 2024-06-03 ENCOUNTER — Ambulatory Visit: Admitting: Family Medicine

## 2024-06-06 ENCOUNTER — Encounter: Payer: Self-pay | Admitting: Family Medicine

## 2024-06-09 ENCOUNTER — Telehealth: Payer: Self-pay

## 2024-06-09 NOTE — Telephone Encounter (Unsigned)
 Copied from CRM [id number]. Topic: General - Other >> Jun 09, 2024 11:56 AM Deaijah H wrote: Reason for CRM: Rosina w/ [facility] [employer] called in to get information on patients chronic medical conditions. Please call [phone].

## 2024-06-27 ENCOUNTER — Telehealth: Payer: Self-pay

## 2024-06-27 NOTE — Telephone Encounter (Signed)
 Copied from CRM #[id number]. Topic: Clinical - Medical Advice >> Jun 27, 2024  3:39 PM Antwanette L wrote: Reason for CRM: Bernardino from [employer] at [facility] is requesting a callback to verify the patient's chronic condition.He can be reached at [phone] and reference number is  [id number].

## 2024-07-01 NOTE — Telephone Encounter (Signed)
 Form faxed 06/30/24

## 2024-08-22 ENCOUNTER — Ambulatory Visit: Payer: Medicare Other

## 2024-08-25 ENCOUNTER — Ambulatory Visit: Admitting: Family Medicine

## 2024-09-13 ENCOUNTER — Telehealth: Payer: Self-pay | Admitting: Family Medicine

## 2024-09-13 NOTE — Telephone Encounter (Unsigned)
 Copied from CRM #[id number]. Topic: Clinical - Refused Triage >> Sep 13, 2024  8:45 AM Antwanette L wrote: Pt called to r/s his app that was on 2/4. The pt is experiencing pain in both knees. The pt said he did not need to speak w/ a nurse

## 2024-09-14 ENCOUNTER — Ambulatory Visit: Admitting: Family Medicine

## 2024-10-26 ENCOUNTER — Ambulatory Visit: Admitting: Cardiology

## 2024-11-04 ENCOUNTER — Ambulatory Visit: Admitting: Family Medicine
